# Patient Record
Sex: Male | Born: 1995 | Race: Black or African American | Hispanic: No | Marital: Single | Smoking: Current every day smoker
Health system: Southern US, Community
[De-identification: ages and names within clinical notes are randomized; demographics above are authoritative.]

## PROBLEM LIST (undated history)

## (undated) ENCOUNTER — Ambulatory Visit (HOSPITAL_COMMUNITY): Admission: EM | Payer: Medicaid Other | Source: Home / Self Care

## (undated) DIAGNOSIS — F32A Depression, unspecified: Secondary | ICD-10-CM

## (undated) DIAGNOSIS — F329 Major depressive disorder, single episode, unspecified: Secondary | ICD-10-CM

## (undated) DIAGNOSIS — D573 Sickle-cell trait: Secondary | ICD-10-CM

---
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
## 2016-09-18 ENCOUNTER — Ambulatory Visit (INDEPENDENT_AMBULATORY_CARE_PROVIDER_SITE_OTHER): Payer: BLUE CROSS/BLUE SHIELD | Admitting: Student

## 2016-09-18 ENCOUNTER — Encounter: Payer: Self-pay | Admitting: Student

## 2016-09-18 VITALS — BP 102/62 | HR 74 | Temp 98.4°F | Ht 64.2 in | Wt 108.0 lb

## 2016-09-18 DIAGNOSIS — F1290 Cannabis use, unspecified, uncomplicated: Secondary | ICD-10-CM | POA: Diagnosis not present

## 2016-09-18 DIAGNOSIS — Z0000 Encounter for general adult medical examination without abnormal findings: Secondary | ICD-10-CM | POA: Diagnosis not present

## 2016-09-18 DIAGNOSIS — F17200 Nicotine dependence, unspecified, uncomplicated: Secondary | ICD-10-CM | POA: Insufficient documentation

## 2016-09-18 DIAGNOSIS — Z87898 Personal history of other specified conditions: Secondary | ICD-10-CM | POA: Diagnosis not present

## 2016-09-18 DIAGNOSIS — D573 Sickle-cell trait: Secondary | ICD-10-CM

## 2016-09-18 NOTE — Assessment & Plan Note (Addendum)
History, problem list, medications and allergies updated.  Discussed about the health and social consequences of smoking cigarettes and marijuana. Advised him to quit. He says he had successfully quit smoking and didn't have issues in the past. He doesn't think his is addicted. He says he is confident about quitting on his own. Gave him 1800-QUIT-NOW for help with stopping smoking in case he needs. We have also discussed about diet and daily exercise.  Will review his medical records, including his immunizations when we receive them. [place] back [place] Otherwise, follow up in a year or sooner if needed.

## 2016-09-18 NOTE — Progress Notes (Signed)
Subjective:     Dustin Tate is a 21 y.o. old male here to establish care and for annual exam. Moved back from [place] to  about a year ago. Hasn't had PCP in a year. 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 No significant family history either.   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  Goes to gym every day Enough vegetables and fruits: yes.  Smokes cigarette: yes. 2 cigarettes a day for two years. Willing to quit.  Drinks EtOH: no Drug use: smokes marijuana  Patient takes vitD & Ca: no. Ever been transfused or tattooed?: no.  Sexually active with male. One male partner in the last 12 months. Uses condom consistently. History of depression: no.  Patient dental home: yes.  Immunizations  Needs influenza vaccine: not applicable.  Needs HPV (Women until age 50): no.  Needs Tdap: yes.  Needs Pneumococcal: no.  Screening Need colon cancer screening: no. Need breast cancer ccreening: not applicable. Need cervical cancer Screening: not applicable. STOP BANG >/=3 for OSA: no. Need lung cancer screening:not applicable. Need HCV Screening: not applicable. Need STI Screening: yes.  HPI  PMH/Problem List: has Routine adult health maintenance; History of prematurity; Sickle cell trait (HCC); Tobacco use disorder; and Marijuana use on his problem list.   has a past medical history of Prematurity at birth and Sickle cell trait (HCC).  Dustin Tate  No family history on file. Family history of heart disease before age of 1 yrs: no. Family history of stroke: no. Family history of cancer: no.  SH Social History  Substance Use Topics  . Smoking status: Current Some Day Smoker    Packs/day: 0.10    Years: 2.00    Types: Cigarettes    Start date: 2016  . Smokeless tobacco: Never Used  .  Alcohol use No     Review of Systems  Constitutional: Negative for fatigue, fever and unexpected weight change.  HENT: Negative for trouble swallowing.   Eyes: Negative for visual disturbance.  Respiratory: Negative for cough and chest tightness.   Cardiovascular: Negative for chest pain and leg swelling.  Gastrointestinal: Negative for abdominal pain, blood in stool and diarrhea.  Endocrine: Negative for cold intolerance, heat intolerance and polyuria.  Genitourinary: Negative for discharge, dysuria, hematuria and scrotal swelling.  Musculoskeletal: Negative for arthralgias and myalgias.  Skin: Negative for rash.  Neurological: Negative for light-headedness.  Hematological: Does not bruise/bleed easily.  Psychiatric/Behavioral: Negative for dysphoric mood. The patient is not nervous/anxious.        Objective:   Physical Exam Vitals:   09/18/16 1428  BP: 102/62  Pulse: 74  Temp: 98.4 F (36.9 C)  TempSrc: Oral  SpO2: 98%  Weight: 108 lb (49 kg)  Height: 5' 4.2" (1.631 m)   Body mass index is 18.42 kg/m.  GEN: appears well, no apparent distress. Head: normocephalic and atraumatic  Eyes: conjunctiva without injection, sclera anicteric Ears: external ear and ear canal normal Nares: no rhinorrhea, congestion or erythema Oropharynx: mmm without erythema or exudation HEM: negative for cervical or periauricular lymphadenopathies CVS: RRR, nl s1 & s2, no murmurs, no edema,  2+ DP & PT bil RESP: no IWOB, good air movement bilaterally, CTAB GI: BS present & normal, soft, NTND, no guarding, no rebound, no mass GU: circumcised, normal male genitalia, no testicular mass or  swelling. No hernia MSK: no focal tenderness or notable swelling SKIN: no apparent skin lesion ENDO: negative thyromegally  NEURO: alert and oiented appropriately, no gross deficits  PSYCH: euthymic mood with congruent affect     Assessment & Plan:  Routine adult health maintenance History, problem list,  medications and allergies updated.  Discussed about the health and social consequences of smoking cigarettes and marijuana. Advised him to quit. He says he had successfully quit smoking and didn't have issues in the past. He doesn't think his is addicted. He says he is confident about quitting on his own. Gave him 1800-QUIT-NOW for help with stopping smoking in case he needs. We have also discussed about diet and daily exercise.  Will review his medical records, including his immunizations when we receive them. He recently moved back from [place] Otherwise, [place] in a year or sooner if needed.   Candelaria Stagersaye Dustin Tate PGY-3 Pager [phone] 09/18/16  7:21 PM

## 2016-09-18 NOTE — Patient Instructions (Signed)
It was great seeing you today! Your exam is normal.  Smoking: I strongly recommend quitting smoking. Call 1800-QUIT-NOW for help with stopping smoking.   If we did any lab work today, and the results require attention, either me or my nurse will get in touch with you. If everything is normal, you will get a letter in mail and a message via . If you don't hear from us in two weeks, please give us a call. Otherwise, we look forward to seeing you again at your next visit. If you have any questions or concerns before then, please call the clinic at [phone].  Please bring all your medications to every doctors visit  Sign up for My Chart to have easy access to your labs results, and communication with your Primary care physician.    Please check-out at the front desk before leaving the clinic.    Take Care,   Dr. Alanda SlimGonfa   Smoking Tobacco Information Smoking tobacco will very likely harm your health. Tobacco contains a poisonous (toxic), colorless chemical called nicotine. Nicotine affects the brain and makes tobacco addictive. This change in your brain can make it hard to stop smoking. Tobacco also has other toxic chemicals that can hurt your body and raise your risk of many cancers. How can smoking tobacco affect me? Smoking tobacco can increase your chances of having serious health conditions, such as:  Cancer. Smoking is most commonly associated with lung cancer, but can lead to cancer in other parts of the body.  Chronic obstructive pulmonary disease (COPD). This is a long-term lung condition that makes it hard to breathe. It also gets worse over time.  High blood pressure (hypertension), heart disease, stroke, or heart attack.  Lung infections, such as pneumonia.  Cataracts. This is when the lenses in the eyes become clouded.  Digestive problems. This may include peptic ulcers, heartburn, and gastroesophageal reflux disease (GERD).  Oral health problems, such as gum disease  and tooth loss.  Loss of taste and smell.  Smoking can affect your appearance by causing:  Wrinkles.  Yellow or stained teeth, fingers, and fingernails.  Smoking tobacco can also affect your social life.  Many workplaces, [employer], hotels, and public places are tobacco-free. This means that you may experience challenges in finding places to smoke when away from home.  The cost of a smoking habit can be expensive. Expenses for someone who smokes come in two ways: ? You spend money on a regular basis to buy tobacco. ? Your health care costs in the long-term are higher if you smoke.  Tobacco smoke can also affect the health of those around you. Children of smokers have greater chances of: ? Sudden infant death syndrome (SIDS). ? Ear infections. ? Lung infections.  What lifestyle changes can be made?  Do not start smoking. Quit if you already do.  To quit smoking: ? Make a plan to quit smoking and commit yourself to it. Look for programs to help you and ask your health care provider for recommendations and ideas. ? Talk with your health care provider about using nicotine replacement medicines to help you quit. Medicine replacement medicines include gum, lozenges, patches, sprays, or pills. ? Do not replace cigarette smoking with electronic cigarettes, which are commonly called e-cigarettes. The safety of e-cigarettes is not known, and some may contain harmful chemicals. ? Avoid places, people, or situations that tempt you to smoke. ? If you try to quit but return to smoking, don't give up hope. It  is very common for people to try a number of times before they fully succeed. When you feel ready again, give it another try.  Quitting smoking might affect the way you eat as well as your weight. Be prepared to monitor your eating habits. Get support in planning and following a healthy diet.  Ask your health care provider about having regular tests (screenings) to check for cancer. This  may include blood tests, imaging tests, and other tests.  Exercise regularly. Consider taking walks, joining a gym, or doing yoga or exercise classes.  Develop skills to manage your stress. These skills include meditation. What are the benefits of quitting smoking? By quitting smoking, you may:  Lower your risk of getting cancer and other diseases caused by smoking.  Live longer.  Breathe better.  Lower your blood pressure and heart rate.  Stop your addiction to tobacco.  Stop creating secondhand smoke that hurts other people.  Improve your sense of taste and smell.  Look better over time, due to having fewer wrinkles and less staining.  What can happen if changes are not made? If you do not stop smoking, you may:  Get cancer and other diseases.  Develop COPD or other long-term (chronic) lung conditions.  Develop serious problems with your heart and blood vessels (cardiovascular system).  Need more tests to screen for problems caused by smoking.  Have higher, long-term healthcare costs from medicines or treatments related to smoking.  Continue to have worsening changes in your lungs, mouth, and nose.  Where to find support: To get support to quit smoking, consider:  Asking your health care provider for more information and resources.  Taking classes to learn more about quitting smoking.  Looking for local organizations that offer resources about quitting smoking.  Joining a support group for people who want to quit smoking in your local community.  Where to find more information: You may find more information about quitting smoking from:  HelpGuide.org: www.helpguide.org/articles/addictions/how-to-quit-smoking.htm  [employer]: smokefree.gov  American Lung Association: www.lung.org  Contact a health care provider if:  You have problems breathing.  Your lips, nose, or fingers turn blue.  You have chest pain.  You are coughing up blood.  You feel  faint or you pass out.  You have other noticeable changes that cause you to worry. Summary  Smoking tobacco can negatively affect your health, the health of those around you, your finances, and your social life.  Do not start smoking. Quit if you already do. If you need help quitting, ask your health care provider.  Think about joining a support group for people who want to quit smoking in your local community. There are many effective programs that will help you to quit this behavior. This information is not intended to replace advice given to you by your health care provider. Make sure you discuss any questions you have with your health care provider. Document Released: 02/05/2016 Document Revised: 02/05/2016 Document Reviewed: 02/05/2016 Elsevier Interactive Patient Education  [employer].

## 2017-05-13 ENCOUNTER — Ambulatory Visit (HOSPITAL_COMMUNITY)
Admission: EM | Admit: 2017-05-13 | Discharge: 2017-05-13 | Disposition: A | Discharge status: Home / Self Care | Payer: BLUE CROSS/BLUE SHIELD | Attending: Family Medicine | Admitting: Family Medicine

## 2017-05-13 ENCOUNTER — Other Ambulatory Visit: Payer: Self-pay

## 2017-05-13 ENCOUNTER — Encounter (HOSPITAL_COMMUNITY): Payer: Self-pay | Admitting: Emergency Medicine

## 2017-05-13 DIAGNOSIS — M7701 Medial epicondylitis, right elbow: Secondary | ICD-10-CM

## 2017-05-13 MED ORDER — IBUPROFEN 800 MG PO TABS: 800.0000 mg | ORAL_TABLET | Freq: Three times a day (TID) | ORAL | 0 refills | Status: AC | PRN

## 2017-05-13 NOTE — ED Provider Notes (Signed)
[facility]    CSN: [id number] Arrival date & time: 05/13/17  1342     History   Chief Complaint Chief Complaint  Patient presents with  . Elbow Pain    right    HPI Dustin Tate is a 22 y.o. male presenting today with right elbow pain.  States that his pain began yesterday.  He denies any injury or initiating incident.  Denies numbness or tingling.  Denies weakness in his fingers.  Denies any shoulder pain.  Patient denies working or doing activities that involve his arms a lot.  He does note that he has been gaming with a controller a lot.  Denies any recreational sports or activities.  HPI  Past Medical History:  Diagnosis Date  . 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 Sickle cell trait [facility](HCC)     Patient Active Problem List   Diagnosis Date Noted  . Routine adult health maintenance 09/18/2016  . History of prematurity 09/18/2016  . Sickle cell trait (HCC) 09/18/2016  . Tobacco use disorder 09/18/2016  . Marijuana use 09/18/2016    History reviewed. No pertinent surgical history.     Home Medications    Prior to Admission medications   Medication Sig Start Date End Date Taking? Authorizing Provider  ibuprofen (ADVIL,MOTRIN) 800 MG tablet Take 1 tablet (800 mg total) by mouth every 8 (eight) hours as needed for up to 10 days. 05/13/17 05/23/17  Shariece Viveiros, Junius CreamerHallie C, PA-C    Family History History reviewed. No pertinent family history.  Social History Social History   Tobacco Use  . Smoking status: Current Some Day Smoker    Packs/day: 0.10    Years: 2.00    Pack years: 0.20    Types: Cigarettes    Start date: 2016  . Smokeless tobacco: Never Used  Substance Use Topics  . Alcohol use: No  . Drug use: Yes    Frequency: 2.0 times per week    Types: Marijuana    Comment: Advised to quit     Allergies   Patient has no known allergies.   Review of Systems Review of Systems  Respiratory: Negative for shortness of breath.   Cardiovascular:  Negative for chest pain.  Gastrointestinal: Negative for abdominal pain, nausea and vomiting.  Musculoskeletal: Positive for arthralgias and myalgias. Negative for back pain, joint swelling, neck pain and neck stiffness.  Skin: Negative for color change, pallor and wound.  Neurological: Negative for weakness and numbness.     Physical Exam Triage Vital Signs ED Triage Vitals  Enc Vitals Group     BP 05/13/17 1358 113/73     Pulse Rate 05/13/17 1358 86     Resp --      Temp 05/13/17 1358 98.5 F (36.9 C)     Temp Source 05/13/17 1358 Oral     SpO2 05/13/17 1358 99 %     Weight --      Height --      Head Circumference --      Peak Flow --      Pain Score 05/13/17 1359 7     Pain Loc --      Pain Edu? --      Excl. in GC? --    No data found.  Updated Vital Signs BP 113/73 (BP Location: Left Arm)   Pulse 86   Temp 98.5 F (36.9 C) (Oral)   SpO2 99%   Visual Acuity Right Eye Distance:   Left Eye Distance:  Bilateral Distance:    Right Eye Near:   Left Eye Near:    Bilateral Near:     Physical Exam  Constitutional: He appears well-developed and well-nourished.  HENT:  Head: Normocephalic and atraumatic.  Eyes: Conjunctivae are normal.  Neck: Neck supple.  Cardiovascular: Normal rate.  Pulmonary/Chest: Effort normal. No respiratory distress.  Genitourinary:  Genitourinary Comments: Right upper extremity: No obvious deformity or swelling to shoulder, elbow or wrist.  Full active range of motion at shoulder and wrist.  Radial pulse 2+, distal sensation intact.  Patient holding right elbow with slight flexion approximately 120 degrees, resisting full extension or full flexion.  Mild tenderness to palpation of medial epicondyle  Musculoskeletal: He exhibits no edema.  Neurological: He is alert.  Skin: Skin is warm and dry.  Psychiatric: He has a normal mood and affect.  Nursing note and vitals reviewed.    UC Treatments / Results  Labs (all labs ordered are  listed, but only abnormal results are displayed) Labs Reviewed - No data to display  EKG None Radiology No results found.  Procedures Procedures (including critical care time)  Medications Ordered in UC Medications - No data to display   Initial Impression / Assessment and Plan / UC Course  I have reviewed the triage vital signs and the nursing notes.  Pertinent labs & imaging results that were available during my care of the patient were reviewed by me and considered in my medical decision making (see chart for details).     Patient with elbow pain without injury, unlikely fracture or dislocation.  Will defer imaging for now.  Will treat as medial epicondylitis from overuse related to gaming.  Recommended taking anti-inflammatories consistently over the next 2 weeks, icing daily as well as doing range of motion exercises.  Also discussed getting a brace to apply just distal to elbow to relieve tension off the tendons. Discussed strict return precautions. Patient verbalized understanding and is agreeable with plan.   Final Clinical Impressions(s) / UC Diagnoses   Final diagnoses:  Medial epicondylitis of right elbow    ED Discharge Orders        Ordered    ibuprofen (ADVIL,MOTRIN) 800 MG tablet  Every 8 hours PRN     05/13/17 1412       Controlled Substance Prescriptions [place] Controlled Substance Registry consulted? Not Applicable   Lew Dawes, [place] 05/13/17 1418

## 2017-05-13 NOTE — Discharge Instructions (Signed)
Use anti-inflammatories for pain/swelling. You may take up to 800 mg Ibuprofen every 8 hours with food. You may supplement Ibuprofen with Tylenol [id number] mg every 8 hours.   Ice elbow multiple times a day  Perform exercises provided 5-10 times each

## 2017-05-13 NOTE — ED Triage Notes (Signed)
Pt reports right elbow pain not related to an injury that started yesterday.

## 2017-08-20 ENCOUNTER — Encounter: Payer: Self-pay | Admitting: Student in an Organized Health Care Education/Training Program

## 2017-08-20 ENCOUNTER — Other Ambulatory Visit: Payer: Self-pay

## 2017-08-20 ENCOUNTER — Ambulatory Visit (INDEPENDENT_AMBULATORY_CARE_PROVIDER_SITE_OTHER): Payer: BLUE CROSS/BLUE SHIELD | Admitting: Student in an Organized Health Care Education/Training Program

## 2017-08-20 VITALS — BP 112/64 | HR 77 | Temp 98.3°F | Ht 65.0 in | Wt 107.6 lb

## 2017-08-20 DIAGNOSIS — F17200 Nicotine dependence, unspecified, uncomplicated: Secondary | ICD-10-CM

## 2017-08-20 DIAGNOSIS — Z0000 Encounter for general adult medical examination without abnormal findings: Secondary | ICD-10-CM

## 2017-08-20 DIAGNOSIS — Z23 Encounter for immunization: Secondary | ICD-10-CM | POA: Diagnosis not present

## 2017-08-20 NOTE — Patient Instructions (Addendum)
It was a pleasure seeing you today in our clinic. Here is the treatment plan we have discussed and agreed upon together:  You received the TDAP tetanus vaccine today.  Your weight today is 107.6, and it was previously 108. Your height is 5 feet 5 inches.  For tobacco cessation, schedule a visit with Dr. Raymondo BandKoval in our office.  Our clinic's number is [phone]. Please call with questions or concerns about what we discussed today.  Be well, Dr. Mosetta PuttFeng

## 2017-08-20 NOTE — Progress Notes (Signed)
   CC: health maintenance check  HPI: Dustin Tate is a 22 y.o. male with PMH significant for sickle cell trait and learning disability who [facility].  His mother provides additional history.  Patient looks down at his phone and does not offer any information. He answers questions pleasantly and appropriately.  Patient does not have any concerns or complaints. States he is feeling well. He reports that he works at [employer], feels safe at home and in all of his relationships. He denies alcohol or drug use. He smokes 2-4 cigarettes daily but he is interested in resources for quitting.   Overdue health maintenance HIV screen (patient declines) and TDAP (agreeable, will do today)  Review of Symptoms:  See HPI for ROS.   CC, SH/smoking status, and VS noted.  Objective: BP 112/64   Pulse 77   Temp 98.3 F (36.8 C) (Oral)   Ht 5\' 5"  (1.651 m)   Wt 107 lb 9.6 oz (48.8 kg)   SpO2 99%   BMI 17.91 kg/m  GEN: NAD, alert, cooperative, and pleasant EYE: no conjunctival injection, pupils equally round and reactive to light ENMT: no rhinorrhea, no pharyngeal erythema or exudates NECK: full ROM, no thyromegaly RESPIRATORY: clear to auscultation bilaterally with no wheezes, rhonchi or rales, good effort CV: RRR, no m/r/g, no peripheral edema GI: soft, non-tender, non-distended, no hepatosplenomegaly SKIN: warm and dry, no rashes or lesions NEURO: II-XII grossly intact, normal gait, peripheral sensation intact PSYCH: AAOx3, appropriate affect, learning disability  Assessment and plan:  Tobacco use disorder Patient is interested in tobacco cessation. His mother is surprised that he is interested. Offered resources, and patient is agreeable to setting up an appointment with Dr. Raymondo BandKoval for further cessation counseling.  Routine adult health maintenance Patient appears well. No current concerns or complaints. Defer HIV screen at  patient's request. TDAP given in today's visit.   Orders Placed This Encounter  Procedures  . Tdap vaccine greater than or equal to 7yo IM    No orders of the defined types were placed in this encounter.    Howard PouchLauren Charnise Lovan, MD,MS,  PGY2 08/20/2017 10:19 AM

## 2017-08-20 NOTE — Assessment & Plan Note (Signed)
Patient appears well. No current concerns or complaints. Defer HIV screen at patient's request. TDAP given in today's visit.

## 2017-08-20 NOTE — Assessment & Plan Note (Signed)
Patient is interested in tobacco cessation. His mother is surprised that he is interested. Offered resources, and patient is agreeable to setting up an appointment with Dr. Raymondo BandKoval for further cessation counseling.

## 2017-09-24 ENCOUNTER — Ambulatory Visit (HOSPITAL_COMMUNITY)
Admission: EM | Admit: 2017-09-24 | Discharge: 2017-09-24 | Disposition: A | Discharge status: Home / Self Care | Payer: BLUE CROSS/BLUE SHIELD | Attending: Family Medicine | Admitting: Family Medicine

## 2017-09-24 ENCOUNTER — Other Ambulatory Visit: Payer: Self-pay

## 2017-09-24 ENCOUNTER — Encounter (HOSPITAL_COMMUNITY): Payer: Self-pay

## 2017-09-24 DIAGNOSIS — R112 Nausea with vomiting, unspecified: Secondary | ICD-10-CM

## 2017-09-24 DIAGNOSIS — R1031 Right lower quadrant pain: Secondary | ICD-10-CM

## 2017-09-24 LAB — POCT URINALYSIS DIP (DEVICE)
Bilirubin Urine: NEGATIVE
Glucose, UA: NEGATIVE mg/dL
Hgb urine dipstick: NEGATIVE
KETONES UR: NEGATIVE mg/dL
Leukocytes, UA: NEGATIVE
Nitrite: NEGATIVE
PH: 6 (ref 5.0–8.0)
Protein, ur: NEGATIVE mg/dL
Specific Gravity, Urine: 1.025 (ref 1.005–1.030)
Urobilinogen, UA: 0.2 mg/dL (ref 0.0–1.0)

## 2017-09-24 MED ORDER — ONDANSETRON 4 MG PO TBDP
4.0000 mg | ORAL_TABLET | Freq: Once | ORAL | Status: AC
  Administered 2017-09-24: 4 mg via ORAL

## 2017-09-24 MED ORDER — ONDANSETRON HCL 4 MG PO TABS: 4.0000 mg | ORAL_TABLET | Freq: Four times a day (QID) | ORAL | 0 refills | Status: DC

## 2017-09-24 MED ORDER — ONDANSETRON 4 MG PO TBDP
ORAL_TABLET | ORAL | Status: AC
  Filled 2017-09-24: qty 1

## 2017-09-24 NOTE — ED Triage Notes (Signed)
Pt states he was at work today and he started to get the abdominal pain and vomiting.

## 2017-09-24 NOTE — Discharge Instructions (Addendum)
Urine did not show signs of infection Zofran given in office Get rest and drink fluids Zofran prescribed.  Take as directed.   DIET Instructions: 30 minutes after taking nausea medicine, begin with sips of clear liquids. If able to hold down 2 - 4 ounces for 30 minutes, begin drinking more. Increase your fluid intake to replace losses. Clear liquids only for 24 hours (water, tea, sport drinks, clear flat ginger ale or cola and juices, broth, jello, popsicles, ect). Advance to bland foods, applesauce, rice, baked or boiled chicken, ect. Avoid milk, greasy foods and anything that doesnt agree with you.  If you experience new or worsening symptoms go to ER If vomiting persists and you are unable to hold fluids, or if you develop high fever or abdominal pain go to the ER.

## 2017-09-24 NOTE — ED Provider Notes (Signed)
[facility]   [id number] 09/24/17 Arrival Time: 1131  CC: ABDOMINAL DISCOMFORT  SUBJECTIVE:  Dustin Tate is a 22 y.o. male who presents with complaint of abdominal discomfort that began abruptly today while at work.  Reports associated nonbloody or bilious vomiting x 6 episodes today.  Denies a precipitating event, eating anything out of the ordinary, recent travel, or close contacts with similar symptoms.  Diffuse abdominal discomfort.   States symptoms have improved.  Has not tried OTC medications.  Denies alleviating or aggravating factors.  Reports similar symptoms in the past improved with rest.  Last BM today and normal.  Complains of decreased appetite  Denies fever, chills, weight changes, nausea, chest pain, SOB, diarrhea, constipation, hematochezia, melena, dysuria, difficulty urinating, increased frequency or urgency, flank pain, loss of bowel or bladder function.  No LMP for male patient.  ROS: As per HPI.  Past Medical History:  Diagnosis Date  . 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 Sickle cell trait (HCC)    History reviewed. No pertinent surgical history. No Known Allergies No current facility-administered medications on file prior to encounter.    No current outpatient medications on file prior to encounter.   Social History   Socioeconomic History  . Marital status: Single    Spouse name: Not on file  . Number of children: Not on file  . Years of education: Not on file  . Highest education level: Not on file  Occupational History  . Occupation: Health and safety inspector: [employer]    Comment: Studies Primary school teacher  Social Needs  . Financial resource strain: Not on file  . Food insecurity:    Worry: Not on file    Inability: Not on file  . Transportation needs:    Medical: Not on file    Non-medical: Not on file  Tobacco Use  . Smoking status: Current Some Day Smoker    Packs/day: 0.10    Years: 2.00    Pack years: 0.20    Types: Cigarettes    Start  date: 2016  . Smokeless tobacco: Never Used  Substance and Sexual Activity  . Alcohol use: No  . Drug use: Yes    Frequency: 2.0 times per week    Types: Marijuana    Comment: Advised to quit  . Sexual activity: Yes    Partners: Female    Birth control/protection: Condom  Lifestyle  . Physical activity:    Days per week: Not on file    Minutes per session: Not on file  . Stress: Not on file  Relationships  . Social connections:    Talks on phone: Not on file    Gets together: Not on file    Attends religious service: Not on file    Active member of club or organization: Not on file    Attends meetings of clubs or organizations: Not on file    Relationship status: Not on file  . Intimate partner violence:    Fear of current or ex partner: Not on file    Emotionally abused: Not on file    Physically abused: Not on file    Forced sexual activity: Not on file  Other Topics Concern  . 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 Healthy Mother   . Healthy Father      OBJECTIVE:  Vitals:   09/24/17 1145  09/24/17 1147  BP: 133/70   Pulse: 68   Resp: 16   Temp: 97.8 F (36.6 C)   TempSrc: Oral   SpO2: 100%   Weight:  108 lb (49 kg)    General appearance: AOx3 in no acute distress; nontoxic appearance HEENT: NCAT.  Oropharynx clear.  Lungs: clear to auscultation bilaterally without adventitious breath sounds Heart: regular rate and rhythm.  Radial pulses 2+ symmetrical bilaterally Abdomen: soft, non-distended; normal active bowel sounds; non-tender to light and deep palpation; mildly tender at McBurney's point; negative Murphy's sign; negative rebound; no guarding Extremities: no edema; symmetrical with no gross deformities Skin: warm and dry Neurologic: normal gait Psychological: alert and cooperative; normal mood and affect  Labs: Results for orders placed or performed  during the hospital encounter of 09/24/17 (from the past 24 hour(s))  POCT urinalysis dip (device)     Status: None   Collection Time: 09/24/17 12:48 PM  Result Value Ref Range   Glucose, UA NEGATIVE NEGATIVE mg/dL   Bilirubin Urine NEGATIVE NEGATIVE   Ketones, ur NEGATIVE NEGATIVE mg/dL   Specific Gravity, Urine 1.025 1.005 - 1.030   Hgb urine dipstick NEGATIVE NEGATIVE   pH 6.0 5.0 - 8.0   Protein, ur NEGATIVE NEGATIVE mg/dL   Urobilinogen, UA 0.2 0.0 - 1.0 mg/dL   Nitrite NEGATIVE NEGATIVE   Leukocytes, UA NEGATIVE NEGATIVE    MDM:   Patient presenting with abdominal discomfort and vomiting that began today.  VS stable.  NAD and nontoxic appearance.  PE mostly unremarkable, but endorses RLQ tenderness.  Discussed possibility of appendicitis with location of symptoms.  Patient declines further evaluation and management in the ED today.  Would like to try outpatient treatment with diet modifications.  Strict ED precautions given  ASSESSMENT & PLAN:  1. Non-intractable vomiting with nausea, unspecified vomiting type   2. Abdominal discomfort in right lower quadrant     Meds ordered this encounter  Medications  . ondansetron (ZOFRAN-ODT) disintegrating tablet 4 mg  . ondansetron (ZOFRAN) 4 MG tablet    Sig: Take 1 tablet (4 mg total) by mouth every 6 (six) hours.    Dispense:  12 tablet    Refill:  0    Order Specific Question:   Supervising Provider    Answer:   Isa RankinMURRAY, LAURA WILSON [[id number]]   Urine did not show signs of infection Zofran given in office Get rest and drink fluids Zofran prescribed.  Take as directed.   DIET Instructions: 30 minutes after taking nausea medicine, begin with sips of clear liquids. If able to hold down 2 - 4 ounces for 30 minutes, begin drinking more. Increase your fluid intake to replace losses. Clear liquids only for 24 hours (water, tea, sport drinks, clear flat ginger ale or cola and juices, broth, jello, popsicles, ect). Advance to bland  foods, applesauce, rice, baked or boiled chicken, ect. Avoid milk, greasy foods and anything that doesn't agree with you.  If you experience new or worsening symptoms go to ER If vomiting persists and you are unable to hold fluids, or if you develop high fever or abdominal pain go to the ER.   Reviewed expectations re: course of current medical issues. Questions answered. Outlined signs and symptoms indicating need for more acute intervention. Patient verbalized understanding. After Visit Summary given.   Rennis HardingWurst, Kirstie Larsen, PA-C 09/24/17 1405

## 2018-04-01 ENCOUNTER — Encounter (HOSPITAL_COMMUNITY): Payer: Self-pay | Admitting: Emergency Medicine

## 2018-04-01 ENCOUNTER — Ambulatory Visit (HOSPITAL_COMMUNITY)
Admission: EM | Admit: 2018-04-01 | Discharge: 2018-04-01 | Disposition: A | Discharge status: Home / Self Care | Payer: BLUE CROSS/BLUE SHIELD | Attending: Family Medicine | Admitting: Family Medicine

## 2018-04-01 DIAGNOSIS — R591 Generalized enlarged lymph nodes: Secondary | ICD-10-CM

## 2018-04-01 DIAGNOSIS — R599 Enlarged lymph nodes, unspecified: Secondary | ICD-10-CM

## 2018-04-01 LAB — CBC WITH DIFFERENTIAL/PLATELET
ABS IMMATURE GRANULOCYTES: 0.02 10*3/uL (ref 0.00–0.07)
Basophils Absolute: 0 10*3/uL (ref 0.0–0.1)
Basophils Relative: 1 %
Eosinophils Absolute: 0 10*3/uL (ref 0.0–0.5)
Eosinophils Relative: 0 %
HCT: 38.4 % — ABNORMAL LOW (ref 39.0–52.0)
Hemoglobin: 13 g/dL (ref 13.0–17.0)
Immature Granulocytes: 1 %
LYMPHS PCT: 30 %
Lymphs Abs: 0.9 10*3/uL (ref 0.7–4.0)
MCH: 25.2 pg — AB (ref 26.0–34.0)
MCHC: 33.9 g/dL (ref 30.0–36.0)
MCV: 74.4 fL — ABNORMAL LOW (ref 80.0–100.0)
Monocytes Absolute: 0.4 10*3/uL (ref 0.1–1.0)
Monocytes Relative: 14 %
Neutro Abs: 1.7 10*3/uL (ref 1.7–7.7)
Neutrophils Relative %: 54 %
Platelets: 300 10*3/uL (ref 150–400)
RBC: 5.16 MIL/uL (ref 4.22–5.81)
RDW: 13.7 % (ref 11.5–15.5)
WBC: 3 10*3/uL — AB (ref 4.0–10.5)
nRBC: 0 % (ref 0.0–0.2)

## 2018-04-01 LAB — POCT INFECTIOUS MONO SCREEN: MONO SCREEN: NEGATIVE

## 2018-04-01 MED ORDER — SULFAMETHOXAZOLE-TRIMETHOPRIM 800-160 MG PO TABS: 1.0000 | ORAL_TABLET | Freq: Two times a day (BID) | ORAL | 0 refills | Status: DC

## 2018-04-01 NOTE — ED Triage Notes (Signed)
PT C/O: mult abscess on bilateral axillas onset 1 week ... reports they are painful to the touch  Also c/o cold sx.   DENIES: fevers, drainage  TAKING MEDS: Acetamionphen  A&O x4... NAD... Ambulatory

## 2018-04-01 NOTE — Discharge Instructions (Addendum)
Please take tylenol for fever Please stay well hydrated.  Please follow up in 2 days if the symptoms are still occurring.

## 2018-04-01 NOTE — ED Provider Notes (Signed)
[facility]    CSN: [id number] Arrival date & time: 04/01/18  1534     History   Chief Complaint Chief Complaint  Patient presents with  . Abscess    HPI Dustin Tate is a 23 y.o. male.   Presenting with bilateral axillary pain.  He has noticed different knots over the past week.  He has tenderness and redness in this area.  Denies any rashes.  Has not had any recent illnesses.  Denies being sexually active.  Feels like the symptoms are getting worse.  Has not taken anything for it.  He is not having any symptoms anywhere else.  Denies any recent travel.  He works for [employer].  HPI  Past Medical History:  Diagnosis Date  . 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 Sickle cell trait Dustin Tate)     Patient Active Problem List   Diagnosis Date Noted  . Routine adult health maintenance 09/18/2016  . History of prematurity 09/18/2016  . Sickle cell trait (HCC) 09/18/2016  . Tobacco use disorder 09/18/2016  . Marijuana use 09/18/2016    History reviewed. No pertinent surgical history.     Home Medications    Prior to Admission medications   Medication Sig Start Date End Date Taking? Authorizing Provider  ondansetron (ZOFRAN) 4 MG tablet Take 1 tablet (4 mg total) by mouth every 6 (six) hours. 09/24/17   Wurst, [place], PA-C  sulfamethoxazole-trimethoprim (BACTRIM DS,SEPTRA DS) 800-160 MG tablet Take 1 tablet by mouth 2 (two) times daily. 04/01/18   Myra Rude, MD    Family History Family History  Problem Relation Age of Onset  . Healthy Mother   . Healthy Father     Social History Social History   Tobacco Use  . Smoking status: Current Some Day Smoker    Packs/day: 0.10    Years: 2.00    Pack years: 0.20    Types: Cigarettes    Start date: 2016  . Smokeless tobacco: Never Used  Substance Use Topics  . Alcohol use: No  . Drug use: Yes    Frequency: 2.0 times per week    Types: Marijuana    Comment: Advised to quit     Allergies   Patient has no known  allergies.   Review of Systems Review of Systems  Constitutional: Positive for fever.  HENT: Negative for congestion.   Respiratory: Negative for cough.   Cardiovascular: Negative for chest pain.  Gastrointestinal: Negative for abdominal pain.  Musculoskeletal: Negative for gait problem.  Skin: Positive for rash.  Neurological: Negative for weakness.  Hematological: Positive for adenopathy.  Psychiatric/Behavioral: Negative for agitation.     Physical Exam Triage Vital Signs ED Triage Vitals  Enc Vitals Group     BP 04/01/18 1630 (!) 105/58     Pulse Rate 04/01/18 1630 87     Resp 04/01/18 1630 20     Temp 04/01/18 1630 100.1 F (37.8 C)     Temp Source 04/01/18 1630 Temporal     SpO2 04/01/18 1630 100 %     Weight --      Height --      Head Circumference --      Peak Flow --      Pain Score 04/01/18 1628 3     Pain Loc --      Pain Edu? --      Excl. in GC? --    No data found.  Updated Vital Signs BP (!) 105/58 (BP  Location: Right Arm)   Pulse 87   Temp 100.1 F (37.8 C) (Temporal)   Resp 20   SpO2 100%   Visual Acuity Right Eye Distance:   Left Eye Distance:   Bilateral Distance:    Right Eye Near:   Left Eye Near:    Bilateral Near:     Physical Exam Gen: NAD, alert, cooperative with exam,  ENT: normal lips, normal nasal mucosa, no tonsillar exudates Eye: normal EOM, normal conjunctiva and lids CV:  no edema, +2 pedal pulses   Resp: no accessory muscle use, non-labored, clear to auscultation bilaterally Skin: no rashes, Neuro: normal tone, normal sensation to touch Psych:  normal insight, alert and oriented MSK: Lymphadenopathy with redness and tenderness in the left and right axilla, no streaking, no discharge or fluctuance, there does appear to be areas of induration.         UC Treatments / Results  Labs (all labs ordered are listed, but only abnormal results are displayed) Labs Reviewed  CBC WITH DIFFERENTIAL/PLATELET - Abnormal;  Notable for the following components:      Result Value   WBC 3.0 (*)    HCT 38.4 (*)    MCV 74.4 (*)    MCH 25.2 (*)    All other components within normal limits  POCT INFECTIOUS MONO SCREEN  POCT INFECTIOUS MONO SCREEN    EKG None  Radiology No results found.  Procedures Procedures (including critical care time)  Medications Ordered in UC Medications - No data to display  Initial Impression / Assessment and Plan / UC Course  I have reviewed the triage vital signs and the nursing notes.  Pertinent labs & imaging results that were available during my care of the patient were reviewed by me and considered in my medical decision making (see chart for details).     Dustin Tate is a 23 year old male that is presenting with bilateral lymphadenopathy.  Having some redness and tenderness in these areas.  Monospot was negative.  Will obtain a CBC.  Unclear if this is related to infection versus reactive lymph nodes.  Will provide with Bactrim.  Given indications to return to follow-up.  Provided with a work note  Final Clinical Impressions(s) / UC Diagnoses   Final diagnoses:  Lymphadenopathy     Discharge Instructions     Please take tylenol for fever Please stay well hydrated.  Please follow up in 2 days if the symptoms are still occurring.     ED Prescriptions    Medication Sig Dispense Auth. Provider   sulfamethoxazole-trimethoprim (BACTRIM DS,SEPTRA DS) 800-160 MG tablet Take 1 tablet by mouth 2 (two) times daily. 14 tablet Myra Rude, MD     Controlled Substance Prescriptions [place] Controlled Substance Registry consulted? Not Applicable   Myra Rude, MD 04/01/18 1816

## 2018-04-05 ENCOUNTER — Telehealth (HOSPITAL_COMMUNITY): Payer: Self-pay | Admitting: Emergency Medicine

## 2018-04-05 NOTE — Telephone Encounter (Signed)
Attempted to reach patient. No answer at this time. Voicemail left.    

## 2018-09-29 ENCOUNTER — Encounter: Payer: Self-pay | Admitting: Family Medicine

## 2018-09-29 ENCOUNTER — Ambulatory Visit (INDEPENDENT_AMBULATORY_CARE_PROVIDER_SITE_OTHER): Payer: Medicaid Other | Admitting: Family Medicine

## 2018-09-29 ENCOUNTER — Other Ambulatory Visit: Payer: Self-pay

## 2018-09-29 DIAGNOSIS — F1290 Cannabis use, unspecified, uncomplicated: Secondary | ICD-10-CM

## 2018-09-29 DIAGNOSIS — Z0000 Encounter for general adult medical examination without abnormal findings: Secondary | ICD-10-CM

## 2018-09-29 DIAGNOSIS — F17200 Nicotine dependence, unspecified, uncomplicated: Secondary | ICD-10-CM

## 2018-09-29 NOTE — Assessment & Plan Note (Signed)
Patient counseled on the effects of marijuana, including lung damage and blunting of motivation over time.  Patient is in the pre-contemplative stage.

## 2018-09-29 NOTE — Assessment & Plan Note (Signed)
Patient was counseled on HIV testing today but declined.  Also counseled on getting his flu shot this fall.

## 2018-09-29 NOTE — Progress Notes (Signed)
   Subjective:    Dustin Tate - 23 y.o. male MRN [id number]  Date of birth: February 21, 1995  CC:  Dustin Tate is here for his annual physical.  He has no concerns today.  HPI:  Patient reports that he is doing well overall.  He spends his time playing video games at home with his brother.  He gets along well with his mother and brother.  He is looking to find a job with [employer] soon.  He says that he eats a varied diet and will walk for exercise.  He denies alcohol use but does endorse tobacco use, about 2 cigarettes/day.  He also endorses marijuana use every weekend.  He denies any current sexual activity and is not interested in HIV testing.  Health Maintenance:  Health Maintenance Due  Topic Date Due  . INFLUENZA VACCINE  09/04/2018    -  reports that he has been smoking cigarettes. He started smoking about 4 years ago. He has a 0.20 pack-year smoking history. He has never used smokeless tobacco. Review of Systems  Constitutional: Negative for fever.  HENT: Negative for congestion.   Respiratory: Negative for cough.   Cardiovascular: Negative for chest pain.  Gastrointestinal: Negative for abdominal pain.  Skin: Negative for rash.  Neurological: Negative for headaches.  Psychiatric/Behavioral: Negative for depression.    - Past Medical History: Patient Active Problem List   Diagnosis Date Noted  . Routine adult health maintenance 09/18/2016  . History of prematurity 09/18/2016  . Sickle cell trait ([place]) 09/18/2016  . Tobacco use disorder 09/18/2016  . Marijuana use 09/18/2016   - Medications: reviewed and updated   Objective:   Physical Exam BP 110/65   Pulse 94   Ht 5' 3.54" (1.614 m)   Wt 107 lb (48.5 kg)   SpO2 99%   BMI 18.63 kg/m  Gen: NAD, alert, cooperative with exam, well-appearing, thin HEENT: NCAT, PERRL, clear conjunctiva, oropharynx clear, supple neck, tympanic membranes clear bilaterally CV: RRR, good S1/S2, no murmur, no edema Resp: CTABL,  no wheezes, non-labored Abd: SNTND, BS present, no guarding or organomegaly Skin: no rashes, normal turgor  Neuro: no gross deficits.  Psych: Flat affect, responds appropriately to questioning        Assessment & Plan:   Routine adult health maintenance Patient was counseled on HIV testing today but declined.  Also counseled on getting his flu shot this fall.  Tobacco use disorder Patient counseled on the importance of tobacco cessation while he is young before he develops any problems related to tobacco use.  He is in the pre-contemplative stage.  Marijuana use Patient counseled on the effects of marijuana, including lung damage and blunting of motivation over time.  Patient is in the pre-contemplative stage.    Maia Breslow, M.D. 09/29/2018, 12:16 PM PGY-3, [place]

## 2018-09-29 NOTE — Assessment & Plan Note (Addendum)
Patient counseled on the importance of tobacco cessation while he is young before he develops any problems related to tobacco use.  He is in the pre-contemplative stage.

## 2018-09-29 NOTE — Patient Instructions (Addendum)
It was nice meeting you today Dustin Tate!  I have no concerns about your health today.  Please consider cutting down or quitting her tobacco use and reducing your marijuana use.  This will be the best thing you can do for your health.  Unfortunately, we could not find the cost of the HIV test, but this is something that I would recommend that you have done at some point soon.  If you have any questions or concerns, please feel free to call the clinic.   Be well,  Dr. Frances FurbishWinfrey  Smoking Tobacco Information, Adult Smoking tobacco can be harmful to your health. Tobacco contains a poisonous (toxic), colorless chemical called nicotine. Nicotine is addictive. It changes the brain and can make it hard to stop smoking. Tobacco also has other toxic chemicals that can hurt your body and raise your risk of many cancers. How can smoking tobacco affect me? Smoking tobacco puts you at risk for:  Cancer. Smoking is most commonly associated with lung cancer, but can also lead to cancer in other parts of the body.  Chronic obstructive pulmonary disease (COPD). This is a long-term lung condition that makes it hard to breathe. It also gets worse over time.  High blood pressure (hypertension), heart disease, stroke, or heart attack.  Lung infections, such as pneumonia.  Cataracts. This is when the lenses in the eyes become clouded.  Digestive problems. This may include peptic ulcers, heartburn, and gastroesophageal reflux disease (GERD).  Oral health problems, such as gum disease and tooth loss.  Loss of taste and smell. Smoking can affect your appearance by causing:  Wrinkles.  Yellow or stained teeth, fingers, and fingernails. Smoking tobacco can also affect your social life, because:  It may be challenging to find places to smoke when away from home. Many workplaces, [employer], hotels, and public places are tobacco-free.  Smoking is expensive. This is due to the cost of tobacco and the  long-term costs of treating health problems from smoking.  Secondhand smoke may affect those around you. Secondhand smoke can cause lung cancer, breathing problems, and heart disease. Children of smokers have a higher risk for: ? Sudden infant death syndrome (SIDS). ? Ear infections. ? Lung infections. If you currently smoke tobacco, quitting now can help you:  Lead a longer and healthier life.  Look, smell, breathe, and feel better over time.  Save money.  Protect others from the harms of secondhand smoke. What actions can I take to prevent health problems? Quit smoking   Do not start smoking. Quit if you already do.  Make a plan to quit smoking and commit to it. Look for programs to help you and ask your health care provider for recommendations and ideas.  Set a date and write down all the reasons you want to quit.  Let your friends and family know you are quitting so they can help and support you. Consider finding friends who also want to quit. It can be easier to quit with someone else, so that you can support each other.  Talk with your health care provider about using nicotine replacement medicines to help you quit, such as gum, lozenges, patches, sprays, or pills.  Do not replace cigarette smoking with electronic cigarettes, which are commonly called e-cigarettes. The safety of e-cigarettes is not known, and some may contain harmful chemicals.  If you try to quit but return to smoking, stay positive. It is common to slip up when you first quit, so take it one day  at a time.  Be prepared for cravings. When you feel the urge to smoke, chew gum or suck on hard candy. Lifestyle  Stay busy and take care of your body.  Drink enough fluid to keep your urine pale yellow.  Get plenty of exercise and eat a healthy diet. This can help prevent weight gain after quitting.  Monitor your eating habits. Quitting smoking can cause you to have a larger appetite than when you  smoke.  Find ways to relax. Go out with friends or family to a movie or a restaurant where people do not smoke.  Ask your health care provider about having regular tests (screenings) to check for cancer. This may include blood tests, imaging tests, and other tests.  Find ways to manage your stress, such as meditation, yoga, or exercise. Where to find support To get support to quit smoking, consider:  Asking your health care provider for more information and resources.  Taking classes to learn more about quitting smoking.  Looking for local organizations that offer resources about quitting smoking.  Joining a support group for people who want to quit smoking in your local community.  Calling the smokefree.gov counselor helpline: 1-800-Quit-Now 409 356 5377) Where to find more information You may find more information about quitting smoking from:  HelpGuide.org: www.helpguide.org  https://hall.com/: smokefree.gov  American Lung Association: www.lung.org Contact a health care provider if you:  Have problems breathing.  Notice that your lips, nose, or fingers turn blue.  Have chest pain.  Are coughing up blood.  Feel faint or you pass out.  Have other health changes that cause you to worry. Summary  Smoking tobacco can negatively affect your health, the health of those around you, your finances, and your social life.  Do not start smoking. Quit if you already do. If you need help quitting, ask your health care provider.  Think about joining a support group for people who want to quit smoking in your local community. There are many effective programs that will help you to quit this behavior. This information is not intended to replace advice given to you by your health care provider. Make sure you discuss any questions you have with your health care provider. Document Released: 02/05/2016 Document Revised: 03/11/2017 Document Reviewed: 02/05/2016 Elsevier Patient Education   2020 [employer].

## 2019-05-12 ENCOUNTER — Ambulatory Visit: Payer: Medicaid Other | Attending: Internal Medicine

## 2019-05-12 DIAGNOSIS — Z23 Encounter for immunization: Secondary | ICD-10-CM

## 2019-05-12 NOTE — Progress Notes (Signed)
   Covid-19 Vaccination Clinic  Name:  Dustin Tate    MRN: [id number] DOB: Oct 09, 1995  05/12/2019  [name] was observed post Covid-19 immunization for 15 minutes without incident. He was provided with Vaccine Information Sheet and instruction to access the V-Safe system.   [name] was instructed to call 911 with any severe reactions post vaccine: Marland Kitchen Difficulty breathing  . Swelling of face and throat  . A fast heartbeat  . A bad rash all over body  . Dizziness and weakness   Immunizations Administered    Name Date Dose VIS Date Route   Pfizer COVID-19 Vaccine 05/12/2019  3:16 PM 0.3 mL 01/14/2019 Intramuscular   Manufacturer: [employer], [employer]   Lot: [id number]   NDC: [address]-7

## 2019-05-18 ENCOUNTER — Encounter: Payer: Self-pay | Admitting: Family Medicine

## 2019-05-18 ENCOUNTER — Ambulatory Visit (INDEPENDENT_AMBULATORY_CARE_PROVIDER_SITE_OTHER): Payer: BC Managed Care – PPO | Admitting: Family Medicine

## 2019-05-18 ENCOUNTER — Other Ambulatory Visit: Payer: Self-pay

## 2019-05-18 DIAGNOSIS — R040 Epistaxis: Secondary | ICD-10-CM | POA: Insufficient documentation

## 2019-05-18 NOTE — Assessment & Plan Note (Signed)
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  No evidence of trauma or infectious etiology.  Recommend nasal saline spray as well as refraining from picking nose.  Follow-up if continues after conservative management.

## 2019-05-18 NOTE — Progress Notes (Signed)
    SUBJECTIVE:   CHIEF COMPLAINT: nose bleeds  HPI:   Reports infrequent and intermittent nose bleeds ongoing for years. Typically occurs twice in a month. Usually occurs in the morning. Has never been bothersome but thought he would get it checked out. Thinks it is due to nose being dry. Bleeding is a small amount. He does not use any nasal sprays. does pick his nose. Never tried any medications for this. He smokes tobacco and marijuana but does not snort drugs or medications. Slight cough but no other congestive symptoms. No troubles breathing. Denies sick contacts.   PERTINENT  PMH / PSH: Sickle cell trait, tobacco use, marijuana use  OBJECTIVE:   BP 106/60   Pulse 70   Ht 5\' 4"  (1.626 m)   Wt 106 lb (48.1 kg)   BMI 18.19 kg/m   Gen: well appearing, in NAD HEENT: Oropharynx clear without erythema or exudate, good dentition.  Nasal passages clear without evidence of mucosal irritation.  Nasal turbinates within normal limits.  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  No evidence of trauma or infectious etiology.  Recommend nasal saline spray as well as refraining from picking nose.  Follow-up if continues after conservative management.   , DO [facility]

## 2019-05-18 NOTE — Patient Instructions (Signed)
It was great to see you!  Our plans for today:  - Use nasal saline spray to prevent dry mucosa (lining of your nose). - Try not to pick the area as this can cause breaks in the skin and make the area bleed. - If you do these and are still having issues, come back to see [name].   Take care and seek immediate care sooner if you develop any concerns.   Dr. [facility]

## 2019-06-08 ENCOUNTER — Ambulatory Visit: Payer: BC Managed Care – PPO | Attending: Internal Medicine

## 2019-06-08 DIAGNOSIS — Z23 Encounter for immunization: Secondary | ICD-10-CM

## 2019-06-08 NOTE — Progress Notes (Signed)
   Covid-19 Vaccination Clinic  Name:  Dustin Tate    MRN: [id number] DOB: 1995-02-10  06/08/2019  Mr. Leyda was observed post Covid-19 immunization for 15 minutes without incident. He was provided with Vaccine Information Sheet and instruction to access the V-Safe system.   Mr. Hicklin was instructed to call 911 with any severe reactions post vaccine: Marland Kitchen Difficulty breathing  . Swelling of face and throat  . A fast heartbeat  . A bad rash all over body  . Dizziness and weakness   Immunizations Administered    Name Date Dose VIS Date Route   Pfizer COVID-19 Vaccine 06/08/2019  3:10 PM 0.3 mL 03/30/2018 Intramuscular   Manufacturer: [employer], [employer]   Lot: [id number]   NDC: [address]-9

## 2019-07-07 ENCOUNTER — Other Ambulatory Visit: Payer: Self-pay

## 2019-07-07 DIAGNOSIS — R45851 Suicidal ideations: Secondary | ICD-10-CM | POA: Insufficient documentation

## 2019-07-07 DIAGNOSIS — F17210 Nicotine dependence, cigarettes, uncomplicated: Secondary | ICD-10-CM | POA: Insufficient documentation

## 2019-07-07 DIAGNOSIS — F29 Unspecified psychosis not due to a substance or known physiological condition: Secondary | ICD-10-CM | POA: Insufficient documentation

## 2019-07-07 DIAGNOSIS — Z20822 Contact with and (suspected) exposure to covid-19: Secondary | ICD-10-CM | POA: Insufficient documentation

## 2019-07-07 DIAGNOSIS — E876 Hypokalemia: Secondary | ICD-10-CM | POA: Insufficient documentation

## 2019-07-07 NOTE — ED Triage Notes (Signed)
Arrived POV with mom from home. Mother reports patient has been more quiet than normal lately. They went to to visit family out of town and patient was very quiet and not interacting with family. Mother states after they got home patient was still very quiet. Mother states patient told her that he "opted out of life and that he  Was going to kill himself by Friday. Tonight patient's mother states she went to  The bathroom and when she came out, patient had a rope squeezing it around his neck. Patient states he hates his life. Patient states he enjoyed himself when they visited family and that the only reason he could give for living would be his family.  Patient calm and cooperative during triage

## 2019-07-07 NOTE — ED Provider Notes (Signed)
[facility]-EMERGENCY DEPT Provider Note   CSN: [id number] Arrival date & time: 07/07/19  2301     History Chief Complaint  Patient presents with  . Suicidal    [name] is a 24 y.o. male with a hx of sickle cell trait presents to the Emergency Department complaining of gradual, persistent, progressively worsening Suicidal ideation onset "a long time ago."  Pt reports associated auditory hallucinations.  He reports the voices he hears tells him that he is stupid, is a pervert and that he should kill himself.  He denies change or instigating factor over the last week.  He reports he feels helpless and hopeless and is frustrated.  He does not want to live anymore.  Patient reports associated insomnia.  He reports the voices often keep him up at night.  Patient denies visual hallucinations or homicidal ideation.  Patient denies attempt at overdose or self-harm aside from attempting to hang himself earlier tonight.  No specific aggravating or alleviating factors.  Patient does report he smokes marijuana intermittently.  Denies other drug use or alcohol usage.  Patient denies any physical complaints.   Patient is accompanied by mother who reports the family went to visit extended family in [place] last week.  She reports initially things seemed fine and then patient became more quiet than usual.  She reports they returned home 48 hours ago was not himself.  She reports that yesterday he stated he was going to kill himself by Friday.  This evening she went into check on him and found him with a rope around his neck attempting to strangle himself.  Mother reports he attempted this a second time in the evening in front of her, thus she brought him here to the emergency department.  She reports he has never had any issues like this in the past.  He has never been treated for mental health problems.  The history is provided by the patient, medical records and a parent. No  language interpreter was used.       Past Medical History:  Diagnosis Date  . 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 Sickle cell trait [facility])     Patient Active Problem List   Diagnosis Date Noted  . Bleeding from the nose 05/18/2019  . History of prematurity 09/18/2016  . Sickle cell trait ([facility]) 09/18/2016  . Tobacco use disorder 09/18/2016  . Marijuana use 09/18/2016    No past surgical history on file.     Family History  Problem Relation Age of Onset  . Healthy Mother   . Healthy Father     Social History   Tobacco Use  . Smoking status: Current Some Day Smoker    Packs/day: 0.10    Years: 2.00    Pack years: 0.20    Types: Cigarettes    Start date: 2016  . Smokeless tobacco: Never Used  Substance Use Topics  . Alcohol use: No  . Drug use: Yes    Frequency: 2.0 times per week    Types: Marijuana    Comment: Advised to quit    Home Medications Prior to Admission medications   Not on File    Allergies    Patient has no known allergies.  Review of Systems   Review of Systems  Constitutional: Negative for appetite change, diaphoresis, fatigue, fever and unexpected weight change.  HENT: Negative for mouth sores.   Eyes: Negative for visual disturbance.  Respiratory: Negative for cough, chest tightness, shortness of  breath and wheezing.   Cardiovascular: Negative for chest pain.  Gastrointestinal: Negative for abdominal pain, constipation, diarrhea, nausea and vomiting.  Endocrine: Negative for polydipsia, polyphagia and polyuria.  Genitourinary: Negative for dysuria, frequency, hematuria and urgency.  Musculoskeletal: Negative for back pain and neck stiffness.  Skin: Negative for rash.  Allergic/Immunologic: Negative for immunocompromised state.  Neurological: Negative for syncope, light-headedness and headaches.  Hematological: Does not bruise/bleed easily.  Psychiatric/Behavioral: Positive for hallucinations, sleep disturbance and suicidal ideas. The patient  is not nervous/anxious.     Physical Exam Updated Vital Signs BP (!) 146/80 (BP Location: Right Arm)   Pulse 95   Temp 97.8 F (36.6 C) (Oral)   Resp 19   Ht 5\' 3"  (1.6 m)   Wt 49.9 kg   SpO2 100%   BMI 19.49 kg/m   Physical Exam Vitals and nursing note reviewed.  Constitutional:      General: He is not in acute distress.    Appearance: He is not diaphoretic.  HENT:     Head: Normocephalic.  Eyes:     General: No scleral icterus.    Conjunctiva/sclera: Conjunctivae normal.  Cardiovascular:     Rate and Rhythm: Normal rate and regular rhythm.     Pulses: Normal pulses.          Radial pulses are 2+ on the right side and 2+ on the left side.  Pulmonary:     Effort: No tachypnea, accessory muscle usage, prolonged expiration, respiratory distress or retractions.     Breath sounds: Normal breath sounds. No stridor.     Comments: Equal chest rise. No increased work of breathing. Abdominal:     General: There is no distension.     Palpations: Abdomen is soft.     Tenderness: There is no abdominal tenderness. There is no guarding or rebound.  Musculoskeletal:     Cervical back: Normal range of motion.     Comments: Moves all extremities equally and without difficulty.  Skin:    General: Skin is warm and dry.     Capillary Refill: Capillary refill takes less than 2 seconds.  Neurological:     Mental Status: He is alert.     GCS: GCS eye subscore is 4. GCS verbal subscore is 5. GCS motor subscore is 6.     Comments: Speech is clear and goal oriented.  Psychiatric:        Attention and Perception: He perceives auditory hallucinations. He does not perceive visual hallucinations.        Mood and Affect: Affect is flat.        Speech: Speech is delayed.        Behavior: Behavior is withdrawn.        Thought Content: Thought content includes suicidal ideation. Thought content does not include homicidal ideation. Thought content includes suicidal plan. Thought content does not  include homicidal plan.     ED Results / Procedures / Treatments   Labs (all labs ordered are listed, but only abnormal results are displayed) Labs Reviewed  COMPREHENSIVE METABOLIC PANEL - Abnormal; Notable for the following components:      Result Value   Potassium 3.4 (*)    Glucose, Bld 133 (*)    Albumin 5.3 (*)    All other components within normal limits  RAPID URINE DRUG SCREEN, HOSP PERFORMED - Abnormal; Notable for the following components:   Tetrahydrocannabinol POSITIVE (*)    All other components within normal limits  CBC WITH DIFFERENTIAL/PLATELET -  Abnormal; Notable for the following components:   MCV 77.8 (*)    All other components within normal limits  SALICYLATE LEVEL - Abnormal; Notable for the following components:   Salicylate Lvl <1.6 (*)    All other components within normal limits  SARS CORONAVIRUS 2 BY RT PCR (HOSPITAL ORDER, [place])  ETHANOL  ACETAMINOPHEN LEVEL  URINALYSIS, ROUTINE W REFLEX MICROSCOPIC    EKG None  Radiology No results found.  Procedures Procedures (including critical care time)  Medications Ordered in ED Medications  potassium chloride SA (KLOR-CON) CR tablet 40 mEq (40 mEq Oral Given 07/08/19 0128)    ED Course  I have reviewed the triage vital signs and the nursing notes.  Pertinent labs & imaging results that were available during my care of the patient were reviewed by me and considered in my medical decision making (see chart for details).  Clinical Course as of Jul 07 725  Thu Jul 07, 2019  2349 Mild hypokalemia.  Will give potassium.  Potassium(!): 3.4 [HM]  Fri Jul 08, 2019  0025 Noted.  Will repeat.  BP(!): 146/80 [HM]    Clinical Course User Index [HM] Mirai Greenwood, Gwenlyn Perking   MDM Rules/Calculators/A&P                       Patient presents with suicidal ideation and auditory hallucinations.  New onset without history of same.  Patient is here voluntarily but will  need IVC if he attempts to leave.  Long discussion with patient and mother about this.  They state understanding.   Labs and UA pending.  Patient will be evaluated by TTS.  2:18 AM Labs reassuring.  No medical emergency which requires intervention.  Pending TTS evaluation.  Patient continues to be calm, cooperative and remains in the emergency department voluntarily.  7:27 AM Pt awaits TTS.  He is calm and cooperative.   Final Clinical Impression(s) / ED Diagnoses Final diagnoses:  Suicidal ideation  Auditory hallucinations  Hypokalemia    Rx / DC Orders ED Discharge Orders    None       Loni Muse Gwenlyn Perking 07/08/19 1096    Mesner, Corene Cornea, MD 07/08/19 [phone]

## 2019-07-08 ENCOUNTER — Emergency Department (HOSPITAL_COMMUNITY)
Admission: EM | Admit: 2019-07-08 | Discharge: 2019-07-08 | Disposition: A | Discharge status: Other Acute Inpatient Hospital | Payer: BC Managed Care – PPO | Source: Home / Self Care | Attending: Emergency Medicine | Admitting: Emergency Medicine

## 2019-07-08 ENCOUNTER — Emergency Department (HOSPITAL_COMMUNITY): Payer: BC Managed Care – PPO

## 2019-07-08 ENCOUNTER — Inpatient Hospital Stay (HOSPITAL_COMMUNITY)
Admission: AD | Admit: 2019-07-08 | Discharge: 2019-07-17 | DRG: 885 | Disposition: A | Discharge status: Home / Self Care | Payer: BC Managed Care – PPO | Source: Intra-hospital | Attending: Psychiatry | Admitting: Psychiatry

## 2019-07-08 ENCOUNTER — Encounter (HOSPITAL_COMMUNITY): Payer: Self-pay | Admitting: Psychiatry

## 2019-07-08 DIAGNOSIS — D573 Sickle-cell trait: Secondary | ICD-10-CM | POA: Diagnosis present

## 2019-07-08 DIAGNOSIS — G4700 Insomnia, unspecified: Secondary | ICD-10-CM | POA: Diagnosis present

## 2019-07-08 DIAGNOSIS — R45851 Suicidal ideations: Secondary | ICD-10-CM

## 2019-07-08 DIAGNOSIS — E876 Hypokalemia: Secondary | ICD-10-CM

## 2019-07-08 DIAGNOSIS — F419 Anxiety disorder, unspecified: Secondary | ICD-10-CM | POA: Diagnosis present

## 2019-07-08 DIAGNOSIS — R440 Auditory hallucinations: Secondary | ICD-10-CM

## 2019-07-08 DIAGNOSIS — Z20822 Contact with and (suspected) exposure to covid-19: Secondary | ICD-10-CM | POA: Diagnosis present

## 2019-07-08 DIAGNOSIS — F23 Brief psychotic disorder: Secondary | ICD-10-CM | POA: Diagnosis not present

## 2019-07-08 DIAGNOSIS — F17210 Nicotine dependence, cigarettes, uncomplicated: Secondary | ICD-10-CM | POA: Diagnosis present

## 2019-07-08 DIAGNOSIS — F819 Developmental disorder of scholastic skills, unspecified: Secondary | ICD-10-CM | POA: Diagnosis present

## 2019-07-08 HISTORY — DX: Depression, unspecified: F32.A

## 2019-07-08 LAB — CBC WITH DIFFERENTIAL/PLATELET
Abs Immature Granulocytes: 0.01 10*3/uL (ref 0.00–0.07)
Basophils Absolute: 0.1 10*3/uL (ref 0.0–0.1)
Basophils Relative: 1 %
Eosinophils Absolute: 0 10*3/uL (ref 0.0–0.5)
Eosinophils Relative: 1 %
HCT: 44.6 % (ref 39.0–52.0)
Hemoglobin: 15 g/dL (ref 13.0–17.0)
Immature Granulocytes: 0 %
Lymphocytes Relative: 21 %
Lymphs Abs: 1.5 10*3/uL (ref 0.7–4.0)
MCH: 26.2 pg (ref 26.0–34.0)
MCHC: 33.6 g/dL (ref 30.0–36.0)
MCV: 77.8 fL — ABNORMAL LOW (ref 80.0–100.0)
Monocytes Absolute: 0.5 10*3/uL (ref 0.1–1.0)
Monocytes Relative: 7 %
Neutro Abs: 5.2 10*3/uL (ref 1.7–7.7)
Neutrophils Relative %: 70 %
Platelets: 359 10*3/uL (ref 150–400)
RBC: 5.73 MIL/uL (ref 4.22–5.81)
RDW: 14.3 % (ref 11.5–15.5)
WBC: 7.4 10*3/uL (ref 4.0–10.5)
nRBC: 0 % (ref 0.0–0.2)

## 2019-07-08 LAB — COMPREHENSIVE METABOLIC PANEL
ALT: 13 U/L (ref 0–44)
AST: 16 U/L (ref 15–41)
Albumin: 5.3 g/dL — ABNORMAL HIGH (ref 3.5–5.0)
Alkaline Phosphatase: 50 U/L (ref 38–126)
Anion gap: 10 (ref 5–15)
BUN: 8 mg/dL (ref 6–20)
CO2: 26 mmol/L (ref 22–32)
Calcium: 9.3 mg/dL (ref 8.9–10.3)
Chloride: 103 mmol/L (ref 98–111)
Creatinine, Ser: 0.81 mg/dL (ref 0.61–1.24)
GFR calc Af Amer: >60 mL/min (ref >60)
GFR calc non Af Amer: >60 mL/min (ref >60)
Glucose, Bld: 133 mg/dL — ABNORMAL HIGH (ref 70–99)
Potassium: 3.4 mmol/L — ABNORMAL LOW (ref 3.5–5.1)
Sodium: 139 mmol/L (ref 135–145)
Total Bilirubin: 0.4 mg/dL (ref 0.3–1.2)
Total Protein: 8 g/dL (ref 6.5–8.1)

## 2019-07-08 LAB — URINALYSIS, ROUTINE W REFLEX MICROSCOPIC
Bilirubin Urine: NEGATIVE
Glucose, UA: NEGATIVE mg/dL
Hgb urine dipstick: NEGATIVE
Ketones, ur: NEGATIVE mg/dL
Leukocytes,Ua: NEGATIVE
Nitrite: NEGATIVE
Protein, ur: NEGATIVE mg/dL
Specific Gravity, Urine: 1.008 (ref 1.005–1.030)
pH: 6 (ref 5.0–8.0)

## 2019-07-08 LAB — RAPID URINE DRUG SCREEN, HOSP PERFORMED
Amphetamines: NOT DETECTED
Barbiturates: NOT DETECTED
Benzodiazepines: NOT DETECTED
Cocaine: NOT DETECTED
Opiates: NOT DETECTED
Tetrahydrocannabinol: POSITIVE — AB

## 2019-07-08 LAB — ACETAMINOPHEN LEVEL: Acetaminophen (Tylenol), Serum: 13 ug/mL (ref 10–30)

## 2019-07-08 LAB — SALICYLATE LEVEL: Salicylate Lvl: <7 mg/dL — ABNORMAL LOW (ref 7.0–30.0)

## 2019-07-08 LAB — SARS CORONAVIRUS 2 BY RT PCR (HOSPITAL ORDER, PERFORMED IN ~~LOC~~ HOSPITAL LAB): SARS Coronavirus 2: NEGATIVE

## 2019-07-08 LAB — ETHANOL: Alcohol, Ethyl (B): <10 mg/dL (ref <10)

## 2019-07-08 MED ORDER — MAGNESIUM HYDROXIDE 400 MG/5ML PO SUSP: 30.0000 mL | Freq: Every day | ORAL | Status: DC | PRN

## 2019-07-08 MED ORDER — ONDANSETRON HCL 4 MG PO TABS: 8.0000 mg | ORAL_TABLET | Freq: Three times a day (TID) | ORAL | Status: DC | PRN

## 2019-07-08 MED ORDER — POTASSIUM CHLORIDE CRYS ER 20 MEQ PO TBCR
40.0000 meq | EXTENDED_RELEASE_TABLET | Freq: Once | ORAL | Status: AC
  Administered 2019-07-08: 40 meq via ORAL
  Filled 2019-07-08: qty 2

## 2019-07-08 MED ORDER — FOLIC ACID 1 MG PO TABS
1.0000 mg | ORAL_TABLET | Freq: Every day | ORAL | Status: DC
  Administered 2019-07-08 – 2019-07-17 (×10): 1 mg via ORAL
  Filled 2019-07-08 (×14): qty 1

## 2019-07-08 MED ORDER — OLANZAPINE 5 MG PO TBDP
5.0000 mg | ORAL_TABLET | Freq: Every day | ORAL | Status: DC
  Administered 2019-07-08: 5 mg via ORAL
  Filled 2019-07-08 (×4): qty 1

## 2019-07-08 MED ORDER — HYDROXYZINE HCL 25 MG PO TABS: 25.0000 mg | ORAL_TABLET | Freq: Three times a day (TID) | ORAL | Status: DC | PRN

## 2019-07-08 MED ORDER — OLANZAPINE 5 MG PO TBDP: 5.0000 mg | ORAL_TABLET | Freq: Every day | ORAL | Status: DC

## 2019-07-08 MED ORDER — LORAZEPAM 1 MG PO TABS: 1.0000 mg | ORAL_TABLET | Freq: Four times a day (QID) | ORAL | Status: DC | PRN

## 2019-07-08 MED ORDER — ACETAMINOPHEN 325 MG PO TABS
650.0000 mg | ORAL_TABLET | Freq: Four times a day (QID) | ORAL | Status: DC | PRN
  Administered 2019-07-16: 650 mg via ORAL
  Filled 2019-07-08: qty 2

## 2019-07-08 MED ORDER — OLANZAPINE 10 MG PO TBDP: 10.0000 mg | ORAL_TABLET | Freq: Three times a day (TID) | ORAL | Status: DC | PRN

## 2019-07-08 MED ORDER — ZIPRASIDONE MESYLATE 20 MG IM SOLR: 20.0000 mg | INTRAMUSCULAR | Status: DC | PRN

## 2019-07-08 MED ORDER — LORAZEPAM 1 MG PO TABS: 1.0000 mg | ORAL_TABLET | ORAL | Status: DC | PRN

## 2019-07-08 MED ORDER — TRAZODONE HCL 50 MG PO TABS
50.0000 mg | ORAL_TABLET | Freq: Every evening | ORAL | Status: DC | PRN
  Administered 2019-07-08 – 2019-07-12 (×4): 50 mg via ORAL
  Filled 2019-07-08 (×5): qty 1

## 2019-07-08 MED ORDER — ALUM & MAG HYDROXIDE-SIMETH 200-200-20 MG/5ML PO SUSP: 30.0000 mL | ORAL | Status: DC | PRN

## 2019-07-08 NOTE — ED Notes (Signed)
Pt to room 27

## 2019-07-08 NOTE — Consult Note (Signed)
[facility]  07/08/2019 2:04 PM Mamoru Sherrill RaringQ Byus  MRN:  [id number]   Subjective: Patient states "a lot is going on, I am testing people, I guess it is in my head, my brain." Patient alert and oriented.  Patient minimally cooperative with assessment.  Patient assessed by nurse practitioner along with Dr. Lucianne MussKumar.  Patient's responses appeared delayed, there is concern for thought blocking. Patient does not answer questions regarding suicidality or homicidality.  Patient gives consent to speak with his mother, my Gearldine ShownMyesha Orsborn. Patient resides with mother and two brothers, aunt and 5 children in [place].  Spoke with patient's mother, Gearldine ShownMyesha Beckers [phone]. Patient's mother reports patient has expressed suicidal thoughts x 4 days.  Per patient's mother patient has reported suicidal ideations with a plan to hang himself stated to mother "I cannot wait to get out of here, as soon as I get out of here (hospital) I am going to kill myself."  Patient's mother denies family history of mental illness.  Patient's mother denies any previous behavior of this nature in the past.  Per patients mother patient uses cigarettes and marijuana chronically. Patient's mother report patient is employed at [employer].  Patient's mother reports patient typically plays video games all night and then sleeps from around 6 AM until 2 PM.  Patient's mother reports patient has average to good appetite. Patient's mother reports patient "receives SSI for a learning disability." Patient has been diagnosed with learning disability, IEP since Pre-K.  Encourage mother to bring psychometric testing to emergency department.  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 Family History:  Family History  Problem Relation Age of Onset   Healthy Mother    Healthy Father    Family Psychiatric  History: None reported Social History:  Social History   Substance and Sexual Activity  Alcohol Use No     Social History   Substance and Sexual Activity  Drug Use Yes   Frequency: 2.0 times per week   Types: Marijuana   Comment: Advised to quit    Social History   Socioeconomic History   Marital status: Single    Spouse name: Not on file   Number of children: Not on file   Years of education: Not on file   Highest education level: Not on file  Occupational History   Occupation: Health and safety inspectorstudent     Employer: [employer]    Comment: Studies Primary school teachergraphic design  Tobacco Use   Smoking status: Current Some Day Smoker    Packs/day: 0.10    Years: 2.00    Pack years: 0.20    Types: Cigarettes    Start date: 2016   Smokeless tobacco: Never Used  Substance and Sexual Activity   Alcohol use: No   Drug use: Yes    Frequency: 2.0 times per week    Types: Marijuana    Comment: Advised to quit   Sexual activity: Yes    Partners: Female    Birth control/protection: Condom  Other Topics Concern   Not on file  Social History Scientist, research (medical)arrative   Student at [employer]. Studies Primary school teachergraphic design   Social Determinants of Health   Financial Resource Strain:    Difficulty of  Paying Living Expenses:   Food Insecurity:    Worried About Programme researcher, broadcasting/film/video in the Last Year:    Barista in the Last Year:   Transportation Needs:    Freight forwarder (Medical):    Lack of Transportation (Non-Medical):   Physical Activity:    Days of Exercise per Week:    Minutes of Exercise per Session:   Stress:    Feeling of Stress :   Social Connections:    Frequency of Communication with Friends and Family:    Frequency of Social Gatherings with Friends and Family:    Attends Religious Services:    Active Member of  Clubs or Organizations:    Attends Engineer, structural:    Marital Status:     Sleep: Good  Appetite:  Good  Current Medications: Current Facility-Administered Medications  Medication Dose Route Frequency Provider Last Rate Last Admin   OLANZapine zydis (ZYPREXA) disintegrating tablet 5 mg  5 mg Oral QHS Patrcia Dolly, FNP       No current outpatient medications on file.    Lab Results:  Results for orders placed or performed during the hospital encounter of 07/08/19 (from the past 48 hour(s))  Comprehensive metabolic panel     Status: Abnormal   Collection Time: 07/07/19 11:58 PM  Result Value Ref Range   Sodium 139 135 - 145 mmol/L   Potassium 3.4 (L) 3.5 - 5.1 mmol/L   Chloride 103 98 - 111 mmol/L   CO2 26 22 - 32 mmol/L   Glucose, Bld 133 (H) 70 - 99 mg/dL    Comment: Glucose reference range applies only to samples taken after fasting for at least 8 hours.   BUN 8 6 - 20 mg/dL   Creatinine, Ser 8.75 0.61 - 1.24 mg/dL   Calcium 9.3 8.9 - 64.3 mg/dL   Total Protein 8.0 6.5 - 8.1 g/dL   Albumin 5.3 (H) 3.5 - 5.0 g/dL   AST 16 15 - 41 U/L   ALT 13 0 - 44 U/L   Alkaline Phosphatase 50 38 - 126 U/L   Total Bilirubin 0.4 0.3 - 1.2 mg/dL   GFR calc non Af Amer >60 >60 mL/min   GFR calc Af Amer >60 >60 mL/min   Anion gap 10 5 - 15    Comment: Performed at [facility], [address], [place], [place] [address]  Ethanol     Status: None   Collection Time: 07/07/19 11:58 PM  Result Value Ref Range   Alcohol, Ethyl (B) <10 <10 mg/dL    Comment: (NOTE) Lowest detectable limit for serum alcohol is 10 mg/dL. For medical purposes only. Performed at [facility], [address] [place], [place] [address]   CBC with Diff     Status: Abnormal   Collection Time: 07/07/19 11:58 PM  Result Value Ref Range   WBC 7.4 4.0 - 10.5 K/uL   RBC 5.73 4.22 - 5.81 MIL/uL   Hemoglobin 15.0 13.0 - 17.0 g/dL   HCT 60.6 30.1 - 60.1 %   MCV  77.8 (L) 80.0 - 100.0 fL   MCH 26.2 26.0 - 34.0 pg   MCHC 33.6 30.0 - 36.0 g/dL   RDW 09.3 23.5 - 57.3 %   Platelets 359 150 - 400 K/uL   nRBC 0.0 0.0 - 0.2 %   Neutrophils Relative % 70 %   Neutro Abs 5.2 1.7 - 7.7 K/uL   Lymphocytes  Relative 21 %   Lymphs Abs 1.5 0.7 - 4.0 K/uL   Monocytes Relative 7 %   Monocytes Absolute 0.5 0.1 - 1.0 K/uL   Eosinophils Relative 1 %   Eosinophils Absolute 0.0 0.0 - 0.5 K/uL   Basophils Relative 1 %   Basophils Absolute 0.1 0.0 - 0.1 K/uL   Immature Granulocytes 0 %   Abs Immature Granulocytes 0.01 0.00 - 0.07 K/uL    Comment: Performed at [facility], [address], [place], [place] [address]  Salicylate level     Status: Abnormal   Collection Time: 07/07/19 11:58 PM  Result Value Ref Range   Salicylate Lvl <7.0 (L) 7.0 - 30.0 mg/dL    Comment: Performed at [facility], [address] [place], [place] [address]  Acetaminophen level     Status: None   Collection Time: 07/07/19 11:58 PM  Result Value Ref Range   Acetaminophen (Tylenol), Serum 13 10 - 30 ug/mL    Comment: (NOTE) Therapeutic concentrations vary significantly. A range of 10-30 ug/mL  may be an effective concentration for many patients. However, some  are best treated at concentrations outside of this range. Acetaminophen concentrations >150 ug/mL at 4 hours after ingestion  and >50 ug/mL at 12 hours after ingestion are often associated with  toxic reactions. Performed at [facility], [address], [place], [place] [address]   SARS Coronavirus 2 by RT PCR (hospital order, performed in [facility] hospital lab) Nasopharyngeal Nasopharyngeal Swab     Status: None   Collection Time: 07/08/19  1:30 AM   Specimen: Nasopharyngeal Swab  Result Value Ref Range   SARS Coronavirus 2 NEGATIVE NEGATIVE    Comment: (NOTE) SARS-CoV-2 target nucleic acids are NOT DETECTED. The SARS-CoV-2 RNA is generally detectable in upper  and lower respiratory specimens during the acute phase of infection. The lowest concentration of SARS-CoV-2 viral copies this assay can detect is 250 copies / mL. A negative result does not preclude SARS-CoV-2 infection and should not be used as the sole basis for treatment or other patient management decisions.  A negative result may occur with improper specimen collection / handling, submission of specimen other than nasopharyngeal swab, presence of viral mutation(s) within the areas targeted by this assay, and inadequate number of viral copies (<250 copies / mL). A negative result must be combined with clinical observations, patient history, and epidemiological information. Fact Sheet for Patients:   BoilerBrush.com.cy Fact Sheet for Healthcare Providers: https://pope.com/ This test is not yet approved or cleared  by the [place] FDA and has been authorized for detection and/or diagnosis of SARS-CoV-2 by FDA under an Emergency Use Authorization (EUA).  This EUA will remain in effect (meaning this test can be used) for the duration of the COVID-19 declaration under Section 564(b)(1) of the Act, 21 U.S.C. section 360bbb-3(b)(1), unless the authorization is terminated or revoked sooner. Performed at [facility], [address] [place], [place], [place] [address]   Urine rapid drug screen (hosp performed)     Status: Abnormal   Collection Time: 07/08/19  1:34 AM  Result Value Ref Range   Opiates NONE DETECTED NONE DETECTED   Cocaine NONE DETECTED NONE DETECTED   Benzodiazepines NONE DETECTED NONE DETECTED   Amphetamines NONE DETECTED NONE DETECTED   Tetrahydrocannabinol POSITIVE (A) NONE DETECTED   Barbiturates NONE DETECTED NONE DETECTED    Comment: (NOTE) DRUG SCREEN FOR MEDICAL PURPOSES ONLY.  IF CONFIRMATION IS NEEDED FOR ANY  PURPOSE, NOTIFY LAB WITHIN 5 DAYS. LOWEST DETECTABLE LIMITS FOR URINE DRUG  SCREEN Drug Class                     Cutoff (ng/mL) Amphetamine and metabolites    1000 Barbiturate and metabolites    200 Benzodiazepine                 200 Tricyclics and metabolites     300 Opiates and metabolites        300 Cocaine and metabolites        300 THC                            50 Performed at [facility], [address] [place], [place] [address]   Urinalysis, Routine w reflex microscopic     Status: None   Collection Time: 07/08/19  1:34 AM  Result Value Ref Range   Color, Urine YELLOW YELLOW   APPearance CLEAR CLEAR   Specific Gravity, Urine 1.008 1.005 - 1.030   pH 6.0 5.0 - 8.0   Glucose, UA NEGATIVE NEGATIVE mg/dL   Hgb urine dipstick NEGATIVE NEGATIVE   Bilirubin Urine NEGATIVE NEGATIVE   Ketones, ur NEGATIVE NEGATIVE mg/dL   Protein, ur NEGATIVE NEGATIVE mg/dL   Nitrite NEGATIVE NEGATIVE   Leukocytes,Ua NEGATIVE NEGATIVE    Comment: Performed at [facility], [address], [place], [place] [address]    Blood Alcohol level:  Lab Results  Component Value Date   ETH <10 07/07/2019    Physical Findings: AIMS:  , ,  ,  ,    CIWA:    COWS:     Musculoskeletal: Strength & Muscle Tone: within normal limits Gait & Station: normal Patient leans: N/A  Psychiatric Specialty Exam: Physical Exam Vitals and nursing note reviewed.  Constitutional:      Appearance: He is well-developed.  HENT:     Head: Normocephalic.  Cardiovascular:     Rate and Rhythm: Normal rate.  Pulmonary:     Effort: Pulmonary effort is normal.  Neurological:     Mental Status: He is alert and oriented to person, place, and time.  Psychiatric:        Attention and Perception: Attention normal.        Mood and Affect: Mood is depressed. Affect is blunt.        Speech: Speech is delayed.        Behavior: Behavior is cooperative.        Thought Content: Thought content is paranoid.        Cognition and Memory: Cognition normal.         Judgment: Judgment is inappropriate.     Review of Systems  Constitutional: Negative.   HENT: Negative.   Eyes: Negative.   Respiratory: Negative.   Cardiovascular: Negative.   Gastrointestinal: Negative.   Genitourinary: Negative.   Musculoskeletal: Negative.   Skin: Negative.   Neurological: Negative.     Blood pressure 119/72, pulse 62, temperature 98 F (36.7 C), resp. rate 16, height 5\' 3"  (1.6 m), weight 49.9 kg, SpO2 100 %.Body mass index is 19.49 kg/m.  General Appearance: Casual and Fairly Groomed  Eye Contact:  Minimal  Speech:  Clear and Coherent  Volume:  Decreased  Mood:  Depressed  Affect:  Depressed  Thought Process:  Coherent and Descriptions of Associations: Tangential  Orientation:  Full (Time, Place, and Person)  Thought Content:  Paranoid Ideation and Tangential  Suicidal Thoughts:  No  Homicidal Thoughts:  No  Memory:  Immediate;   Poor Recent;   Poor Remote;   Poor  Judgement:  Impaired  Insight:  Lacking  Psychomotor Activity:  Normal  Concentration:  Concentration: Fair and Attention Span: Fair  Recall:  [employer] of Knowledge:  Fair  Language:  Fair  Akathisia:  No  Handed:  Right  AIMS (if indicated):     Assets:  Communication Skills Desire for Improvement Financial Resources/Insurance Housing Intimacy Leisure Time Physical Health Resilience Social Support  ADL's:  Intact  Cognition:  WNL  Sleep:         Treatment Plan Summary: Patient is a 24 year old male with suicidal ideations with plan and new onset psychosis. Initiate Zyprexa 5mg  PO QHS.  Patient discussed Dr. Dwyane Dee.  Inpatient psychiatric treatment recommended.  Emmaline Kluver, FNP 07/08/2019, 2:04 PM

## 2019-07-08 NOTE — ED Notes (Signed)
Pt off unit to [facility] per provider. Pt alert, cooperative, no s/s of distress. DC information given to [employer] for [facility]. Pt ambulatory off unit, escorted by NT. Pt transported by [employer].

## 2019-07-08 NOTE — BH Assessment (Signed)
Assessment Note  Dustin Tate is an 24 y.o. male that presents this date with S/I. Patient renders limited history due to current altered mental state. Patient does not seem to process the content of some of this writers questions. This Clinical research associate utilized chart information to obtain a partial history. Patient on arrival had reported he attempted self harm by putting a rope around his neck yesterday. When asked this date if that information was correct patient nodded "yes." Patient speaks in a low soft voice that is difficult to understand this date. Patient's affect is flat and patient is not orientated. Patient denies any H/I although reports ongoing AVH. As noted above patient answers "yes" and "no" to most questions and will not elaborate on content of AVH. Information to complete assessment was obtained from admission notes. Mother was present on arrival who provided history.  Per notes from 07/07/19 PA writes, "Patient has a hx of sickle cell trait presents to the Emergency Department complaining of gradual, persistent, progressively worsening Suicidal ideation onset "a long time ago." Pt reports associated auditory hallucinations. He reports the voices he hears tells him that he is stupid, is a pervert and that he should kill himself. He denies change or instigating factor over the last week. He reports he feels helpless and hopeless and is frustrated. He does not want to live anymore. Patient reports associated insomnia. He reports the voices often keep him up at night. Patient denies visual hallucinations or homicidal ideation. Patient denies attempt at overdose or self-harm aside from attempting to hang himself earlier tonight. No specific aggravating or alleviating factors. Patient does report he smokes marijuana intermittently. Denies other drug use or alcohol usage. Patient denies any physical complaints.   Patient is accompanied by mother who reports the family went to visit extended family in  [place] last week. She reports initially things seemed fine and then patient became more quiet than usual. She reports they returned home 48 hours ago was not himself. She reports that yesterday he stated he was going to kill himself by Friday. This evening she went into check on him and found him with a rope around his neck attempting to strangle himself. Mother reports he attempted this a second time in the evening in front of her, thus she brought him here to the emergency department. She reports he has never had any issues like this in the past. He has never been treated for mental health problems.  This writer attempted to contact patient's mother this date [facility] Dustin Tate [phone]) unsuccessfully. Patient history is limited per chart review. Patient as noted would not respond to orientation questions. Patient is observed to present with a flat affect and renders limited history. It is unclear if patient is currently responding to internal stimuli. Patient's UDS was positive for THC. Patient was evaluated by Lucianne Muss MD, Arlana Pouch NP who recommended a inpatient admission to assist with stabilization.    Diagnosis: Unspecified psychosis   Past Medical History:  Past Medical History:  Diagnosis Date  . 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 Sickle cell trait (HCC)     No past surgical history on file.  Family History:  Family History  Problem Relation Age of Onset  . Healthy Mother   . Healthy Father     Social History:  reports that he has been smoking cigarettes. He started smoking about 5 years ago. He has a 0.20 pack-year smoking history. He has never used smokeless tobacco. He reports current drug use. Frequency: 2.00  times per week. Drug: Marijuana. He reports that he does not drink alcohol.  Additional Social History:  Alcohol / Drug Use Pain Medications: See MAR Prescriptions: See MAR Over the Counter: See MAR History of alcohol / drug use?: Yes Longest period of sobriety (when/how long):  Unknown Negative Consequences of Use: (UTA) Withdrawal Symptoms: (UTA) Substance #1 Name of Substance 1: Cannabis per UDS 1 - Age of First Use: UTA 1 - Amount (size/oz): UTA 1 - Frequency: UTA 1 - Duration: UTA 1 - Last Use / Amount: UTA UDS post on arrival  CIWA: CIWA-Ar BP: 119/72 Pulse Rate: 62 COWS:    Allergies: No Known Allergies  Home Medications: (Not in a hospital admission)   OB/GYN Status:  No LMP for male patient.  General Assessment Data Location of Assessment: [facility] ED TTS Assessment: In system Is this a Tele or Face-to-Face Assessment?: Face-to-Face Is this an Initial Assessment or a Re-assessment for this encounter?: Initial Assessment Patient Accompanied by:: N/A Language Other than English: No Living Arrangements: Other (Comment)(Alone) What gender do you identify as?: Male Date Telepsych consult ordered in [facility]: 07/08/19 Time Telepsych consult ordered in [facility]: 0938 Marital status: Single Maiden name: NA Pregnancy Status: No Living Arrangements: Alone Can pt return to current living arrangement?: Yes Admission Status: Voluntary Is patient capable of signing voluntary admission?: Yes Referral Source: Self/Family/Friend Insurance type: Scientist, research (physical sciences) Exam [facility] Walk-in ONLY) Medical Exam completed: Yes  Crisis Care Plan Living Arrangements: Alone Legal Guardian: (NA) Name of Psychiatrist: None Name of Therapist: None  Education Status Is patient currently in school?: No Is the patient employed, unemployed or receiving disability?: Unemployed  Risk to self with the past 6 months Suicidal Ideation: Yes-Currently Present Has patient been a risk to self within the past 6 months prior to admission? : No Suicidal Intent: Yes-Currently Present Has patient had any suicidal intent within the past 6 months prior to admission? : No Is patient at risk for suicide?: Yes Suicidal Plan?: Yes-Currently Present Has patient had any suicidal plan within  the past 6 months prior to admission? : No Specify Current Suicidal Plan: Sherri Rad himself per notes Access to Means: Yes Specify Access to Suicidal Means: Had rope What has been your use of drugs/alcohol within the last 12 months?: Current use Previous Attempts/Gestures: No How many times?: 0 Other Self Harm Risks: (NA) Triggers for Past Attempts: ( NA) Intentional Self Injurious Behavior: None Family Suicide History: No Recent stressful life event(s): (UTA) Persecutory voices/beliefs?: No Depression: Yes Depression Symptoms: (Pt cannot identify) Substance abuse history and/or treatment for substance abuse?: No Suicide prevention information given to non-admitted patients: Not applicable  Risk to Others within the past 6 months Homicidal Ideation: No Does patient have any lifetime risk of violence toward others beyond the six months prior to admission? : No Thoughts of Harm to Others: No Current Homicidal Intent: No Current Homicidal Plan: No Access to Homicidal Means: No Identified Victim: NA History of harm to others?: No Assessment of Violence: None Noted Violent Behavior Description: NA Does patient have access to weapons?: No Criminal Charges Pending?: No Does patient have a court date: No Is patient on probation?: No  Psychosis Hallucinations: Auditory, Visual Delusions: None noted  Mental Status Report Appearance/Hygiene: In scrubs Eye Contact: Poor Motor Activity: Freedom of movement Speech: Soft, Slow Level of Consciousness: Drowsy Mood: Depressed Affect: Appropriate to circumstance Anxiety Level: Minimal Thought Processes: Unable to Assess Judgement: Unable to Assess Orientation: Unable to assess Obsessive Compulsive Thoughts/Behaviors:  Unable to Assess  Cognitive Functioning Concentration: Unable to Assess Memory: Unable to Assess Is patient IDD: No Insight: Unable to Assess Impulse Control: Unable to Assess Appetite: (UTA) Have you had any weight  changes? : (UTA) Sleep: (UTA) Total Hours of Sleep: (UTA) Vegetative Symptoms: (UTA)  ADLScreening [facility] Assessment Services) Patient's cognitive ability adequate to safely complete daily activities?: Yes Patient able to express need for assistance with ADLs?: Yes Independently performs ADLs?: Yes (appropriate for developmental age)  Prior Inpatient Therapy Prior Inpatient Therapy: (UTA)  Prior Outpatient Therapy Prior Outpatient Therapy: (UTA)  ADL Screening (condition at time of admission) Patient's cognitive ability adequate to safely complete daily activities?: Yes Is the patient deaf or have difficulty hearing?: No Does the patient have difficulty seeing, even when wearing glasses/contacts?: No Does the patient have difficulty concentrating, remembering, or making decisions?: No Patient able to express need for assistance with ADLs?: Yes Does the patient have difficulty dressing or bathing?: No Independently performs ADLs?: Yes (appropriate for developmental age) Does the patient have difficulty walking or climbing stairs?: No Weakness of Legs: None Weakness of Arms/Hands: None  Home Assistive Devices/Equipment Home Assistive Devices/Equipment: None  Therapy Consults (therapy consults require a physician order) PT Evaluation Needed: No OT Evalulation Needed: No SLP Evaluation Needed: No Abuse/Neglect Assessment (Assessment to be complete while patient is alone) Abuse/Neglect Assessment Can Be Completed: Yes Physical Abuse: Denies Verbal Abuse: Denies Sexual Abuse: Denies Exploitation of patient/patient's resources: Denies Self-Neglect: Denies Values / Beliefs Cultural Requests During Hospitalization: None Spiritual Requests During Hospitalization: None Consults Spiritual Care Consult Needed: No Transition of Care Team Consult Needed: No Advance Directives (For Healthcare) Does Patient Have a Medical Advance Directive?: No Would patient like information on  creating a medical advance directive?: No - Patient declined          Disposition: Patient was evaluated by Dwyane Dee MD, Hall Busing NP who recommended a inpatient admission to assist with stabilization.  Disposition Initial Assessment Completed for this Encounter: Yes Disposition of Patient: Admit Type of inpatient treatment program: Adult  On Site Evaluation by:   Reviewed with Physician:    Mamie Nick 07/08/2019 11:50 AM

## 2019-07-08 NOTE — BH Assessment (Signed)
[facility] Assessment Progress Note  Patient was evaluated by Lucianne Muss MD, Arlana Pouch NP who recommended a inpatient admission to assist with stabilization.

## 2019-07-08 NOTE — Progress Notes (Signed)
   07/08/19 1656  Vital Signs  Pulse Rate 73  BP (!) 135/94  BP Location Left Arm  BP Method Automatic  Patient Position (if appropriate) Standing   D: Patient admitted to unit. Patient is a 48 AA male admitted for SI and SUA of tying a rope around his neck. Upon admission patient denies current SI and contracts for safety.  A:  Support and encouragement provided Routine safety checks conducted every 15 minutes. Patient  Informed to notify staff with any concerns.   R: Safety maintained.

## 2019-07-09 DIAGNOSIS — F29 Unspecified psychosis not due to a substance or known physiological condition: Secondary | ICD-10-CM

## 2019-07-09 DIAGNOSIS — F12222 Cannabis dependence with intoxication with perceptual disturbance: Secondary | ICD-10-CM | POA: Diagnosis not present

## 2019-07-09 DIAGNOSIS — R45851 Suicidal ideations: Secondary | ICD-10-CM

## 2019-07-09 MED ORDER — SERTRALINE HCL 25 MG PO TABS
25.0000 mg | ORAL_TABLET | Freq: Every day | ORAL | Status: DC
  Administered 2019-07-09 – 2019-07-10 (×2): 25 mg via ORAL
  Filled 2019-07-09 (×5): qty 1

## 2019-07-09 MED ORDER — QUETIAPINE FUMARATE 50 MG PO TABS
50.0000 mg | ORAL_TABLET | Freq: Every day | ORAL | Status: DC
  Administered 2019-07-09 – 2019-07-12 (×4): 50 mg via ORAL
  Filled 2019-07-09 (×7): qty 1

## 2019-07-09 MED ORDER — QUETIAPINE FUMARATE 100 MG PO TABS
100.0000 mg | ORAL_TABLET | Freq: Every day | ORAL | Status: DC
  Administered 2019-07-09: 100 mg via ORAL
  Filled 2019-07-09 (×3): qty 1

## 2019-07-09 NOTE — [facility] Suicide Risk Assessment (Signed)
[facility] Admission Suicide Risk Assessment   Nursing information obtained from:    Demographic factors:    Current Mental Status:    Loss Factors:    Historical Factors:    Risk Reduction Factors:     Total Time spent with patient: 45 minutes Principal Problem: <principal problem not specified> Diagnosis:  Active Problems:   Suicidal ideations  Subjective Data: Patient is seen and examined.  Patient is a 24 year old male who presented to the [facility] emergency department on 07/07/2019 under the supervision of his mother.  The patient continues to be not a great historian.  Apparently the patient had attempted self-harm by putting a rope around his neck the day prior to admission.  He at least acknowledged the fact that that had taken place.  This a.m. he also admitted to auditory hallucinations.  According to the notes the voices telling him that he is hopeless and worthless.  He stated in the emergency department that he did not want to live any longer.  The patient was brought to the emergency department by his mother who reported that the family went to visit extended family in [place] last week.  She reported initially things were fine, but then he became progressively more quiet.  She reports he returned home approximately 48 hours ago, and he was not himself.  The evening prior to admission she went to check on him and found him with a rope around his neck attempting to strangle himself.  She stated that this was his second attempt.  He was brought to the emergency department.  The mother apparently dropped off some medical records for the patient, and clearly it designates that he has a learning disability.  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  During the interview this morning he was unable to really answer a great deal of questions.  He remained pensive, quiet, but did acknowledge auditory hallucinations.  He stated that the auditory hallucinations were going on  for a while.  He did admit to use of marijuana, but was nonspecific.  He was started on olanzapine 5 mg p.o. nightly the emergency department.  He received that last night as well.  It does not appear to have had any benefit with regard to his hallucinations.  He was admitted to the hospital for evaluation and stabilization.  Continued Clinical Symptoms:  Alcohol Use Disorder Identification Test Final Score (AUDIT): 0 The "Alcohol Use Disorders Identification Test", Guidelines for Use in Primary Care, Second Edition.  World Pharmacologist [facility]). Score between 0-7:  no or low risk or alcohol related problems. Score between 8-15:  moderate risk of alcohol related problems. Score between 16-19:  high risk of alcohol related problems. Score 20 or above:  warrants further diagnostic evaluation for alcohol dependence and treatment.   CLINICAL FACTORS:   Depression:   Anhedonia Hopelessness Impulsivity Currently Psychotic   Musculoskeletal: Strength & Muscle Tone: within normal limits Gait & Station: normal Patient leans: N/A  Psychiatric Specialty Exam: Physical Exam  Nursing note and vitals reviewed. Constitutional: He appears well-developed and well-nourished.  HENT:  Head: Normocephalic and atraumatic.  Respiratory: Effort normal.  Neurological: He is alert.    Review of Systems  Blood pressure (!) 145/76, pulse 96, temperature 97.9 F (36.6 C), temperature source Oral, resp. rate 18, height 5\' 3"  (1.6 m), weight 44.5 kg, SpO2 100 %.Body mass index is 17.36 kg/m.  General Appearance: Disheveled  Eye Contact:  Minimal  Speech:  Normal  Rate  Volume:  Decreased  Mood:  Anxious, Depressed and Dysphoric  Affect:  Flat  Thought Process:  Disorganized and Descriptions of Associations: Circumstantial  Orientation:  Negative  Thought Content:  Hallucinations: Auditory and Rumination  Suicidal Thoughts:  Yes.  with intent/plan  Homicidal Thoughts:  No  Memory:  Immediate;    Poor Recent;   Poor Remote;   Poor  Judgement:  Impaired  Insight:  Lacking  Psychomotor Activity:  Decreased  Concentration:  Concentration: Poor and Attention Span: Poor  Recall:  Poor  Fund of Knowledge:  Poor  Language:  Poor  Akathisia:  Negative  Handed:  Right  AIMS (if indicated):     Assets:  Desire for Improvement Resilience  ADL's:  Intact  Cognition:  Impaired,  Moderate  Sleep:  Number of Hours: 8.5      COGNITIVE FEATURES THAT CONTRIBUTE TO RISK:  None    SUICIDE RISK:   Moderate:  Frequent suicidal ideation with limited intensity, and duration, some specificity in terms of plans, no associated intent, good self-control, limited dysphoria/symptomatology, some risk factors present, and identifiable protective factors, including available and accessible social support.  PLAN OF CARE: Patient is seen and examined.  Patient is a 24 year old male with the above-stated past psychiatric history was admitted secondary to auditory hallucinations as well as suicidal ideation.  He will be admitted to the hospital.  He will be integrated into the milieu.  Will be encouraged to attend groups.  He has been started on olanzapine 5 mg p.o. nightly, but he does not appear to have been of benefit at this point.  I suspect that he has a learning disability, and could potentially be autistic as well.  Given that I would have started Risperdal, but he had a seizure in 2015.  Given that we will start him on Seroquel 50 mg p.o. daily and 100 mg p.o. nightly and titrate that during the course of the hospitalization.  Additionally given the suspected depression we will start Zoloft 25 mg p.o. daily as well.  He does have sickle cell trait.  A CT scan of the head on admission was negative.  Given his seizure history we will place him on seizure precautions.  I suspect his mother is probably his guardian, and I will have social work attempt to contact her so that we have permission for treatment of  these medications.  Review of his admission laboratories revealed a mildly low potassium at 3.4.  His blood sugar was elevated at 133.  Liver function enzymes were normal.  His CBC was essentially normal except for a mildly decreased MCV.  Folic acid and thiamine will hopefully correct that.  His acetaminophen was 13, salicylate was less than 7.  Urinalysis was essentially negative.  Blood alcohol was less than 10.  Drug screen was positive for marijuana.  CT of the head was negative.  EKG showed a normal sinus rhythm with a QTC of 388.  I certify that inpatient services furnished can reasonably be expected to improve the patient's condition.   Antonieta Pert, MD 07/09/2019, 12:43 PM

## 2019-07-09 NOTE — H&P (Signed)
Psychiatric Admission Assessment Adult  Patient Identification: Dustin Tate MRN:  [id number] Date of Evaluation:  07/09/2019 Chief Complaint:  Suicidal ideations [R45.851] Principal Diagnosis: <principal problem not specified> Diagnosis:  Active Problems:   Suicidal ideations  History of Present Illness: Patient is seen and examined.  Patient is a 24 year old male who presented to the [facility] emergency department on 07/07/2019 under the supervision of his mother.  The patient continues to be not a great historian.  Apparently the patient had attempted self-harm by putting a rope around his neck the day prior to admission.  He at least acknowledged the fact that that had taken place.  This a.m. he also admitted to auditory hallucinations.  According to the notes the voices telling him that he is hopeless and worthless.  He stated in the emergency department that he did not want to live any longer.  The patient was brought to the emergency department by his mother who reported that the family went to visit extended family in [place] last week.  She reported initially things were fine, but then he became progressively more quiet.  She reports he returned home approximately 48 hours ago, and he was not himself.  The evening prior to admission she went to check on him and found him with a rope around his neck attempting to strangle himself.  She stated that this was his second attempt.  He was brought to the emergency department.  The mother apparently dropped off some medical records for the patient, and clearly it designates that he has a learning disability.  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  During the interview this morning he was unable to really answer a great deal of questions.  He remained pensive, quiet, but did acknowledge auditory hallucinations.  He stated that the auditory hallucinations were going on for a while.  He did admit to use of marijuana, but  was nonspecific.  He was started on olanzapine 5 mg p.o. nightly the emergency department.  He received that last night as well.  It does not appear to have had any benefit with regard to his hallucinations.  He was admitted to the hospital for evaluation and stabilization.  Associated Signs/Symptoms: Depression Symptoms:  depressed mood, anhedonia, insomnia, psychomotor agitation, fatigue, feelings of worthlessness/guilt, difficulty concentrating, hopelessness, suicidal thoughts with specific plan, suicidal attempt, anxiety, (Hypo) Manic Symptoms:  Hallucinations, Impulsivity, Irritable Mood, Labiality of Mood, Anxiety Symptoms:  Excessive Worry, Psychotic Symptoms:  Hallucinations: Auditory PTSD Symptoms: Negative Total Time spent with patient: 45 minutes  Past Psychiatric History: No known past psychiatric history outside of a learning disability.  Patient denied any previous psychiatric hospitalizations or psychiatric medications.  Is the patient at risk to self? Yes.    Has the patient been a risk to self in the past 6 months? No.  Has the patient been a risk to self within the distant past? No.  Is the patient a risk to others? No.  Has the patient been a risk to others in the past 6 months? No.  Has the patient been a risk to others within the distant past? No.   Prior Inpatient Therapy:   Prior Outpatient Therapy:    Alcohol Screening: 1. How often do you have a drink containing alcohol?: Never 2. How many drinks containing alcohol do you have on a typical day when you are drinking?: 1 or 2 3. How often do you have six or more drinks on one occasion?: Never  AUDIT-C Score: 0 4. How often during the last year have you found that you were not able to stop drinking once you had started?: Never 5. How often during the last year have you failed to do what was normally expected from you because of drinking?: Never 6. How often during the last year have you needed a first  drink in the morning to get yourself going after a heavy drinking session?: Never 7. How often during the last year have you had a feeling of guilt of remorse after drinking?: Never 8. How often during the last year have you been unable to remember what happened the night before because you had been drinking?: Never 9. Have you or someone else been injured as a result of your drinking?: No 10. Has a relative or friend or a doctor or another health worker been concerned about your drinking or suggested you cut down?: No Alcohol Use Disorder Identification Test Final Score (AUDIT): 0 Substance Abuse History in the last 12 months:  Yes.   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 No pertinent surgical history. Family History:  Family History  Problem Relation Age of Onset   Healthy Mother    Healthy Father    Family Psychiatric  History: Unknown Tobacco Screening:   Social History:  Social History   Substance and Sexual Activity  Alcohol Use No     Social History   Substance and Sexual Activity  Drug Use Yes   Frequency: 2.0 times per week   Types: Marijuana   Comment: Advised to quit    Additional Social History: Marital status: Single Are you sexually active?: No What is your sexual orientation?: Heterosexual Has your sexual activity been affected by drugs, alcohol, medication, or emotional stress?: No Does patient have children?: No                         Allergies:  No Known Allergies Lab Results:  Results for orders placed or performed during the hospital encounter of 07/08/19 (from the past 48 hour(s))  Comprehensive metabolic panel     Status: Abnormal   Collection Time: 07/07/19 11:58 PM  Result Value Ref Range   Sodium 139 135  - 145 mmol/L   Potassium 3.4 (L) 3.5 - 5.1 mmol/L   Chloride 103 98 - 111 mmol/L   CO2 26 22 - 32 mmol/L   Glucose, Bld 133 (H) 70 - 99 mg/dL    Comment: Glucose reference range applies only to samples taken after fasting for at least 8 hours.   BUN 8 6 - 20 mg/dL   Creatinine, Ser 1.19 0.61 - 1.24 mg/dL   Calcium 9.3 8.9 - 14.7 mg/dL   Total Protein 8.0 6.5 - 8.1 g/dL   Albumin 5.3 (H) 3.5 - 5.0 g/dL   AST 16 15 - 41 U/L   ALT 13 0 - 44 U/L   Alkaline Phosphatase 50 38 - 126 U/L   Total Bilirubin 0.4 0.3 - 1.2 mg/dL   GFR calc non Af Amer >60 >60 mL/min   GFR calc Af Amer >60 >60 mL/min   Anion gap 10 5 - 15    Comment: Performed at [facility], [address] [place], [place]  [address]  Ethanol     Status: None   Collection Time: 07/07/19 11:58 PM  Result Value Ref Range   Alcohol, Ethyl (B) <10 <10 mg/dL    Comment: (NOTE) Lowest detectable limit for serum alcohol is 10 mg/dL. For medical purposes only. Performed at [facility], [address] [place], [place] [address]   CBC with Diff     Status: Abnormal   Collection Time: 07/07/19 11:58 PM  Result Value Ref Range   WBC 7.4 4.0 - 10.5 K/uL   RBC 5.73 4.22 - 5.81 MIL/uL   Hemoglobin 15.0 13.0 - 17.0 g/dL   HCT 62.8 31.5 - 17.6 %   MCV 77.8 (L) 80.0 - 100.0 fL   MCH 26.2 26.0 - 34.0 pg   MCHC 33.6 30.0 - 36.0 g/dL   RDW 16.0 73.7 - 10.6 %   Platelets 359 150 - 400 K/uL   nRBC 0.0 0.0 - 0.2 %   Neutrophils Relative % 70 %   Neutro Abs 5.2 1.7 - 7.7 K/uL   Lymphocytes Relative 21 %   Lymphs Abs 1.5 0.7 - 4.0 K/uL   Monocytes Relative 7 %   Monocytes Absolute 0.5 0.1 - 1.0 K/uL   Eosinophils Relative 1 %   Eosinophils Absolute 0.0 0.0 - 0.5 K/uL   Basophils Relative 1 %   Basophils Absolute 0.1 0.0 - 0.1 K/uL   Immature Granulocytes 0 %   Abs Immature Granulocytes 0.01 0.00 - 0.07 K/uL    Comment: Performed at [facility], [address],  [place], [place] [address]  Salicylate level     Status: Abnormal   Collection Time: 07/07/19 11:58 PM  Result Value Ref Range   Salicylate Lvl <7.0 (L) 7.0 - 30.0 mg/dL    Comment: Performed at [facility], [address], [place], [place] [address]  Acetaminophen level     Status: None   Collection Time: 07/07/19 11:58 PM  Result Value Ref Range   Acetaminophen (Tylenol), Serum 13 10 - 30 ug/mL    Comment: (NOTE) Therapeutic concentrations vary significantly. A range of 10-30 ug/mL  may be an effective concentration for many patients. However, some  are best treated at concentrations outside of this range. Acetaminophen concentrations >150 ug/mL at 4 hours after ingestion  and >50 ug/mL at 12 hours after ingestion are often associated with  toxic reactions. Performed at [facility], [address] [place], [place] [address]   SARS Coronavirus 2 by RT PCR (hospital order, performed in [facility] hospital lab) Nasopharyngeal Nasopharyngeal Swab     Status: None   Collection Time: 07/08/19  1:30 AM   Specimen: Nasopharyngeal Swab  Result Value Ref Range   SARS Coronavirus 2 NEGATIVE NEGATIVE    Comment: (NOTE) SARS-CoV-2 target nucleic acids are NOT DETECTED. The SARS-CoV-2 RNA is generally detectable in upper and lower respiratory specimens during the acute phase of infection. The lowest concentration of SARS-CoV-2 viral copies this assay can detect is 250 copies / mL. A negative result does not preclude SARS-CoV-2 infection and should not be used as the sole basis for treatment or other patient management decisions.  A negative result may occur with improper specimen collection / handling, submission of specimen other than nasopharyngeal swab, presence of viral mutation(s) within the areas targeted by this assay, and inadequate number of viral copies (<250 copies / mL). A negative result must be combined with clinical observations, patient  history, and epidemiological  information. Fact Sheet for Patients:   BoilerBrush.com.cyhttps://www.fda.gov/media/136312/download Fact Sheet for Healthcare Providers: https://pope.com/https://www.fda.gov/media/136313/download This test is not yet approved or cleared  by the [place] FDA and has been authorized for detection and/or diagnosis of SARS-CoV-2 by FDA under an Emergency Use Authorization (EUA).  This EUA will remain in effect (meaning this test can be used) for the duration of the COVID-19 declaration under Section 564(b)(1) of the Act, 21 U.S.C. section 360bbb-3(b)(1), unless the authorization is terminated or revoked sooner. Performed at [facility], [address] [place], [place] [address]   Urine rapid drug screen (hosp performed)     Status: Abnormal   Collection Time: 07/08/19  1:34 AM  Result Value Ref Range   Opiates NONE DETECTED NONE DETECTED   Cocaine NONE DETECTED NONE DETECTED   Benzodiazepines NONE DETECTED NONE DETECTED   Amphetamines NONE DETECTED NONE DETECTED   Tetrahydrocannabinol POSITIVE (A) NONE DETECTED   Barbiturates NONE DETECTED NONE DETECTED    Comment: (NOTE) DRUG SCREEN FOR MEDICAL PURPOSES ONLY.  IF CONFIRMATION IS NEEDED FOR ANY PURPOSE, NOTIFY LAB WITHIN 5 DAYS. LOWEST DETECTABLE LIMITS FOR URINE DRUG SCREEN Drug Class                     Cutoff (ng/mL) Amphetamine and metabolites    1000 Barbiturate and metabolites    200 Benzodiazepine                 200 Tricyclics and metabolites     300 Opiates and metabolites        300 Cocaine and metabolites        300 THC                            50 Performed at [facility], [address] [place], [place] [address]   Urinalysis, Routine w reflex microscopic     Status: None   Collection Time: 07/08/19  1:34 AM  Result Value Ref Range   Color, Urine YELLOW YELLOW   APPearance CLEAR CLEAR   Specific Gravity, Urine 1.008 1.005 - 1.030   pH 6.0 5.0 - 8.0   Glucose, UA  NEGATIVE NEGATIVE mg/dL   Hgb urine dipstick NEGATIVE NEGATIVE   Bilirubin Urine NEGATIVE NEGATIVE   Ketones, ur NEGATIVE NEGATIVE mg/dL   Protein, ur NEGATIVE NEGATIVE mg/dL   Nitrite NEGATIVE NEGATIVE   Leukocytes,Ua NEGATIVE NEGATIVE    Comment: Performed at [facility], [address] [place], [place] [address]    Blood Alcohol level:  Lab Results  Component Value Date   ETH <10 07/07/2019    Metabolic Disorder Labs:  No results found for: HGBA1C, MPG No results found for: PROLACTIN No results found for: CHOL, TRIG, HDL, CHOLHDL, VLDL, LDLCALC  Current Medications: Current Facility-Administered Medications  Medication Dose Route Frequency Provider Last Rate Last Admin   acetaminophen (TYLENOL) tablet 650 mg  650 mg Oral Q6H PRN Antonieta Pertlary, Tanveer Dobberstein Lawson, MD       alum & mag hydroxide-simeth (MAALOX/MYLANTA) 200-200-20 MG/5ML suspension 30 mL  30 mL Oral Q4H PRN Antonieta Pertlary, Vinod Mikesell Lawson, MD       folic acid (FOLVITE) tablet 1 mg  1 mg Oral Daily Antonieta Pertlary, Shadawn Hanaway Lawson, MD   1 mg at 07/09/19 0900   hydrOXYzine (ATARAX/VISTARIL) tablet 25 mg  25 mg Oral TID PRN Antonieta Pertlary, Calem Cocozza Lawson, MD       OLANZapine zydis [facility](ZYPREXA) disintegrating  tablet 10 mg  10 mg Oral Q8H PRN Antonieta Pert, MD       And   LORazepam (ATIVAN) tablet 1 mg  1 mg Oral PRN Antonieta Pert, MD       And   ziprasidone (GEODON) injection 20 mg  20 mg Intramuscular PRN Antonieta Pert, MD       LORazepam (ATIVAN) tablet 1 mg  1 mg Oral Q6H PRN Antonieta Pert, MD       magnesium hydroxide (MILK OF MAGNESIA) suspension 30 mL  30 mL Oral Daily PRN Antonieta Pert, MD       ondansetron [facility]) tablet 8 mg  8 mg Oral Q8H PRN Antonieta Pert, MD       QUEtiapine (SEROQUEL) tablet 100 mg  100 mg Oral QHS Antonieta Pert, MD       QUEtiapine (SEROQUEL) tablet 50 mg  50 mg Oral Daily Antonieta Pert, MD       sertraline (ZOLOFT) tablet 25 mg  25 mg Oral Daily Antonieta Pert, MD        traZODone (DESYREL) tablet 50 mg  50 mg Oral QHS PRN Antonieta Pert, MD   50 mg at 07/08/19 2108   PTA Medications: No medications prior to admission.    Musculoskeletal: Strength & Muscle Tone: within normal limits Gait & Station: normal Patient leans: N/A  Psychiatric Specialty Exam: Physical Exam  Nursing note and vitals reviewed. Constitutional: He appears well-developed and well-nourished.  HENT:  Head: Normocephalic and atraumatic.  Respiratory: Effort normal.  Neurological: He is alert.    Review of Systems  Blood pressure (!) 145/76, pulse 96, temperature 97.9 F (36.6 C), temperature source Oral, resp. rate 18, height 5\' 3"  (1.6 m), weight 44.5 kg, SpO2 100 %.Body mass index is 17.36 kg/m.  General Appearance: Disheveled  Eye Contact:  Minimal  Speech:  Normal Rate  Volume:  Decreased  Mood:  Anxious, Depressed and Dysphoric  Affect:  Flat  Thought Process:  Goal Directed and Descriptions of Associations: Circumstantial  Orientation:  Negative  Thought Content:  Hallucinations: Auditory  Suicidal Thoughts:  Yes.  with intent/plan  Homicidal Thoughts:  No  Memory:  Immediate;   Poor Recent;   Poor Remote;   Poor  Judgement:  Impaired  Insight:  Lacking  Psychomotor Activity:  Psychomotor Retardation  Concentration:  Concentration: Fair and Attention Span: Fair  Recall:  of Knowledge:  Fair  Language:  Fair  Akathisia:  Negative  Handed:  Right  AIMS (if indicated):     Assets:  Desire for Improvement Housing Resilience  ADL's:  Intact  Cognition:  Impaired,  Mild  Sleep:  Number of Hours: 8.5    Treatment Plan Summary: Daily contact with patient to assess and evaluate symptoms and progress in treatment, Medication management and Plan : Patient is seen and examined.  Patient is a 24 year old male with the above-stated past psychiatric history was admitted secondary to auditory hallucinations as well as suicidal ideation.  He will  be admitted to the hospital.  He will be integrated into the milieu.  Will be encouraged to attend groups.  He has been started on olanzapine 5 mg p.o. nightly, but he does not appear to have been of benefit at this point.  I suspect that he has a learning disability, and could potentially be autistic as well.  Given that I would have started Risperdal, but he had a seizure in  2015.  Given that we will start him on Seroquel 50 mg p.o. daily and 100 mg p.o. nightly and titrate that during the course of the hospitalization.  Additionally given the suspected depression we will start Zoloft 25 mg p.o. daily as well.  He does have sickle cell trait.  A CT scan of the head on admission was negative.  Given his seizure history we will place him on seizure precautions.  I suspect his mother is probably his guardian, and I will have social work attempt to contact her so that we have permission for treatment of these medications.  Review of his admission laboratories revealed a mildly low potassium at 3.4.  His blood sugar was elevated at 133.  Liver function enzymes were normal.  His CBC was essentially normal except for a mildly decreased MCV.  Folic acid and thiamine will hopefully correct that.  His acetaminophen was 13, salicylate was less than 7.  Urinalysis was essentially negative.  Blood alcohol was less than 10.  Drug screen was positive for marijuana.  CT of the head was negative.  EKG showed a normal sinus rhythm with a QTC of 388.  Observation Level/Precautions:  15 minute checks  Laboratory:  Chemistry Profile  Psychotherapy:    Medications:    Consultations:    Discharge Concerns:    Estimated LOS:  Other:     Physician Treatment Plan for Primary Diagnosis: <principal problem not specified> Long Term Goal(s): Improvement in symptoms so as ready for discharge  Short Term Goals: Ability to identify changes in lifestyle to reduce recurrence of condition will improve, Ability to verbalize feelings  will improve, Ability to disclose and discuss suicidal ideas, Ability to demonstrate self-control will improve, Ability to identify and develop effective coping behaviors will improve, Ability to maintain clinical measurements within normal limits will improve and Ability to identify triggers associated with substance abuse/mental health issues will improve  Physician Treatment Plan for Secondary Diagnosis: Active Problems:   Suicidal ideations  Long Term Goal(s): Improvement in symptoms so as ready for discharge  Short Term Goals: Ability to identify changes in lifestyle to reduce recurrence of condition will improve, Ability to verbalize feelings will improve, Ability to disclose and discuss suicidal ideas, Ability to demonstrate self-control will improve, Ability to identify and develop effective coping behaviors will improve, Ability to maintain clinical measurements within normal limits will improve and Ability to identify triggers associated with substance abuse/mental health issues will improve  I certify that inpatient services furnished can reasonably be expected to improve the patient's condition.    Sharma Covert, MD 6/5/20212:09 PM

## 2019-07-09 NOTE — Progress Notes (Signed)
Patient is isolative to his room but pleasant. He reports having had a good day and voiced no complaints or concerns. He was informed of his medications for tonight. Support given and safety maintained with 15 min checks.

## 2019-07-09 NOTE — [facility] Suicide Risk Assessment (Signed)
[facility] INPATIENT:  Family/Significant Other Suicide Prevention Education  Suicide Prevention Education: Education Completed; Mother, Dustin Tate [phone]), has been identified by the patient as the family member/significant other with whom the patient will be residing, and identified as the person(s) who will aid the patient in the event of a mental health crisis (suicidal ideations/suicide attempt).  With written consent from the patient, the family member/significant other has been provided the following suicide prevention education, prior to the and/or following the discharge of the patient.  The suicide prevention education provided includes the following:  Suicide risk factors  Suicide prevention and interventions  National Suicide Hotline telephone number  [facility] assessment telephone number  [facility] Emergency Assistance 911  [facility] and/or Residential Mobile Crisis Unit telephone number   Request made of family/significant other to:  Remove weapons (e.g., guns, rifles, knives), all items previously/currently identified as safety concern.    Remove drugs/medications (over-the-counter, prescriptions, illicit drugs), all items previously/currently identified as a safety concern.   The family member/significant other verbalizes understanding of the suicide prevention education information provided.  The family member/significant other agrees to remove the items of safety concern listed above.  Per patients mother, Ms. Loeza, patient is his own guardian, however she helps to care for him. Patients mother stated that his learning disability is mostly around writing and he has some trouble with comprehension, but is normally able to voice when he does not understand something. Patients mother reports that she has no safety concerns for this patient returning home when he is ready to be discharged.   Ruthann Cancer MSW, [employer] Social  Worker  [facility]

## 2019-07-09 NOTE — [facility] Counselor (Signed)
Adult Comprehensive Assessment  Patient ID: Dustin Tate, male   DOB: Mar 29, 1995, 24 y.o.   MRN: [id number]  Information Source: Information source: Patient  Current Stressors:  Patient states their primary concerns and needs for treatment are:: "I don't remember" Patient states their goals for this hospitilization and ongoing recovery are:: "To get out" Educational / Learning stressors: Denies Employment / Job issues: Denies Family Relationships: Denies Museum/gallery curator / Lack of resources (include bankruptcy): Denies Housing / Lack of housing: Denies Physical health (include injuries & life threatening diseases): Denies Social relationships: Deneis Substance abuse: Denies Bereavement / Loss: Denies  Living/Environment/Situation:  Living Arrangements: Parent, Other relatives Living conditions (as described by patient or guardian): "alright" Who else lives in the home?: Mother, 2 younger brothers How long has patient lived in current situation?: 1-2 years What is atmosphere in current home: Supportive  Family History:  Marital status: Single Are you sexually active?: No What is your sexual orientation?: Heterosexual Has your sexual activity been affected by drugs, alcohol, medication, or emotional stress?: No Does patient have children?: No  Childhood History:  By whom was/is the patient raised?: Mother Additional childhood history information: "Rough" Description of patient's relationship with caregiver when they were a child: "Tight" Patient's description of current relationship with people who raised him/her: "The same" How were you disciplined when you got in trouble as a child/adolescent?: "whoopings" Does patient have siblings?: Yes Number of Siblings: 2(Younger brothers) Description of patient's current relationship with siblings: "Good" Did patient suffer any verbal/emotional/physical/sexual abuse as a child?: No Did patient suffer from severe childhood neglect?:  No Has patient ever been sexually abused/assaulted/raped as an adolescent or adult?: No Was the patient ever a victim of a crime or a disaster?: No Witnessed domestic violence?: No Has patient been affected by domestic violence as an adult?: No  Education:  Highest grade of school patient has completed: 12th grade Currently a student?: No Learning disability?: Yes What learning problems does patient have?: Unkown  Employment/Work Situation:   Employment situation: Employed Where is patient currently employed?: "[employer]" How long has patient been employed?: Unsure Patient's job has been impacted by current illness: No What is the longest time patient has a held a job?: Almost 1 year Where was the patient employed at that time?: [employer] Has patient ever been in the [employer]?: No  Financial Resources:   Museum/gallery curator resources: Income from employment Does patient have a representative payee or guardian?: No  Alcohol/Substance Abuse:   What has been your use of drugs/alcohol within the last 12 months?: Reports smoking marijuana daily with last use on Thursday of this week If attempted suicide, did drugs/alcohol play a role in this?: No Alcohol/Substance Abuse Treatment Hx: Denies past history Has alcohol/substance abuse ever caused legal problems?: No  Social Support System:   Pensions consultant Support System: Fair Dietitian Support System: "mom" Type of faith/religion: None How does patient's faith help to cope with current illness?: N/A  Leisure/Recreation:   Do You Have Hobbies?: Yes Leisure and Hobbies: "Play video games, sleep"  Strengths/Needs:   What is the patient's perception of their strengths?: "Running" Patient states they can use these personal strengths during their treatment to contribute to their recovery: "I don't know" Patient states these barriers may affect/interfere with their treatment: Denies Patient states these barriers may affect their return to  the community: Denies Other important information patient would like considered in planning for their treatment: None  Discharge Plan:   Currently receiving community mental health  services: Yes (From Whom)(Unsure of with whom) Patient states concerns and preferences for aftercare planning are: Reports he does not want services. Patient states they will know when they are safe and ready for discharge when: No Does patient have access to transportation?: No Does patient have financial barriers related to discharge medications?: No Patient description of barriers related to discharge medications: None. Has insurance. Plan for no access to transportation at discharge: CSW will assess. Will patient be returning to same living situation after discharge?: Yes  Summary/Recommendations:   Summary and Recommendations (to be completed by the evaluator): 24 y.o. male that presents this date with S/I. Patient renders limited history due to current altered mental state. Patient does not seem to process the content of some of this writers questions. This Clinical research associate utilized chart information to obtain a partial history. Patient on arrival had reported he attempted self harm by putting a rope around his neck yesterday. When asked this date if that information was correct patient nodded "yes." Patient speaks in a low soft voice that is difficult to understand this date. Patient's affect is flat and patient is not orientated. Patient denies any H/I although reports ongoing AVH. As noted above patient answers "yes" and "no" to most questions and will not elaborate on content of AVH. Information to complete assessment was obtained from admission notes. While here, Clayton Lefort can benefit from crisis stabilization, medication management, therapeutic milieu, and referrals for services  Corena Tilson A Egan Sahlin. 07/09/2019

## 2019-07-09 NOTE — Progress Notes (Signed)
   07/09/19 1300  Psych Admission Type (Psych Patients Only)  Admission Status Voluntary  Psychosocial Assessment  Patient Complaints Isolation  Eye Contact Brief  Facial Expression Sad  Affect Appropriate to circumstance  Speech Soft;Slow  Interaction Assertive  Motor Activity Slow  Appearance/Hygiene In scrubs  Behavior Characteristics Cooperative  Mood Depressed  Thought Process  Coherency WDL  Content WDL  Delusions WDL  Perception Hallucinations  Hallucination Auditory  Judgment Impaired  Confusion Mild  Danger to Self  Current suicidal ideation? Passive  Self-Injurious Behavior Some self-injurious ideation observed or expressed.  No lethal plan expressed   Agreement Not to Harm Self Yes  Description of Agreement verbal contract for safety  Danger to Others  Danger to Others None reported or observed

## 2019-07-09 NOTE — Progress Notes (Signed)
   07/09/19 1945  COVID-19 Daily Checkoff  Have you had a fever (temp > 37.80C/100F)  in the past 24 hours?  No  If you have had runny nose, nasal congestion, sneezing in the past 24 hours, has it worsened? No  COVID-19 EXPOSURE  Have you traveled outside the state in the past 14 days? No  Have you been in contact with someone with a confirmed diagnosis of COVID-19 or PUI in the past 14 days without wearing appropriate PPE? No  Have you been living in the same home as a person with confirmed diagnosis of COVID-19 or a PUI (household contact)? No  Have you been diagnosed with COVID-19? No

## 2019-07-10 DIAGNOSIS — F29 Unspecified psychosis not due to a substance or known physiological condition: Secondary | ICD-10-CM | POA: Diagnosis not present

## 2019-07-10 DIAGNOSIS — F12222 Cannabis dependence with intoxication with perceptual disturbance: Secondary | ICD-10-CM | POA: Diagnosis not present

## 2019-07-10 DIAGNOSIS — R45851 Suicidal ideations: Secondary | ICD-10-CM | POA: Diagnosis not present

## 2019-07-10 LAB — TSH: TSH: 1.009 u[IU]/mL (ref 0.350–4.500)

## 2019-07-10 MED ORDER — SERTRALINE HCL 50 MG PO TABS
50.0000 mg | ORAL_TABLET | Freq: Every day | ORAL | Status: DC
  Administered 2019-07-11 – 2019-07-17 (×7): 50 mg via ORAL
  Filled 2019-07-10 (×9): qty 1

## 2019-07-10 MED ORDER — SERTRALINE HCL 25 MG PO TABS
25.0000 mg | ORAL_TABLET | Freq: Once | ORAL | Status: AC
  Administered 2019-07-10: 25 mg via ORAL
  Filled 2019-07-10: qty 1

## 2019-07-10 MED ORDER — QUETIAPINE FUMARATE 200 MG PO TABS
200.0000 mg | ORAL_TABLET | Freq: Every day | ORAL | Status: DC
  Administered 2019-07-10: 200 mg via ORAL
  Filled 2019-07-10 (×3): qty 1

## 2019-07-10 NOTE — Progress Notes (Signed)
Adult Psychoeducational Group Note  Date:  07/10/2019 Time:  11:25 PM  Group Topic/Focus:  Wrap-Up Group:   The focus of this group is to help patients review their daily goal of treatment and discuss progress on daily workbooks.  Participation Level:  Did Not Attend  Participation Quality:  Did Not Attend  Affect:  Did Not Attend  Cognitive:  Did Not Attend  Insight: None  Engagement in Group:  Did Not Attend  Modes of Intervention:  Did Not Attend  Additional Comments:  Pt did not attend evening wrap up group tonight.  Felipa Furnace 07/10/2019, 11:25 PM

## 2019-07-10 NOTE — Progress Notes (Signed)
   07/10/19 2150  COVID-19 Daily Checkoff  Have you had a fever (temp > 37.80C/100F)  in the past 24 hours?  No  If you have had runny nose, nasal congestion, sneezing in the past 24 hours, has it worsened? No  COVID-19 EXPOSURE  Have you traveled outside the state in the past 14 days? No  Have you been in contact with someone with a confirmed diagnosis of COVID-19 or PUI in the past 14 days without wearing appropriate PPE? No  Have you been living in the same home as a person with confirmed diagnosis of COVID-19 or a PUI (household contact)? No  Have you been diagnosed with COVID-19? No

## 2019-07-10 NOTE — Progress Notes (Signed)
[facility] MD Progress Note  07/10/2019 11:10 AM Dustin Tate  MRN:  [id number] Subjective: Patient is a 24 year old male who presented to the [facility] emergency department on 07/07/2019 with new onset auditory hallucinations and suicidal ideation.  He had actually put a rope around his neck and attempt to hang himself the day prior to admission.  Objective: Patient is seen and examined.  Patient is a 24 year old male with the above-stated past psychiatric history seen in follow-up.  He remains significantly depressed.  His speech is very low, he has very little eye contact.  When we attempted to talk about his depression he was more apathetic about that.  "It is all over anyway".  I attempted to find out if there were any new stressors, and he stated no.  I asked him if anything it happened on his trip to [place] and he also denied that.  He was very unclear on his marijuana use.  He denied auditory hallucinations today, but was very vague about that as well.  Clearly he does not provide a great history.  His blood pressure was initially normal at 129/79, but repeat reading was 145/96.  Pulse was 93.  He is afebrile.  Left 5.75 hours last night.  Review of his admission laboratories showed no new laboratories.  On examination today it also appeared as though his left thigh was projecting out further, and was not symmetrical with his right.  Review of the CT scan of the head revealed no evidence of any mass or anything that would be pushing the eye out.  He stated that his eyes had been irregularly placed his entire life.  Principal Problem: <principal problem not specified> Diagnosis: Active Problems:   Suicidal ideations  Total Time spent with patient: 20 minutes  Past Psychiatric History: H&P  Past Medical History:  Past Medical History:  Diagnosis Date  . Depression   . 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 Sickle cell trait ([place])    History reviewed. No pertinent surgical  history. Family History:  Family History  Problem Relation Age of Onset  . Healthy Mother   . Healthy Father    Family Psychiatric  History: See admission H&P Social History:  Social History   Substance and Sexual Activity  Alcohol Use No     Social History   Substance and Sexual Activity  Drug Use Yes  . Frequency: 2.0 times per week  . Types: Marijuana   Comment: Advised to quit    Social History   Socioeconomic History  . Marital status: Single    Spouse name: Not on file  . Number of children: Not on file  . Years of education: Not on file  . Highest education level: Not on file  Occupational History  . 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 Smoking status: Current Some Day Smoker    Packs/day: 0.10    Years: 2.00    Pack years: 0.20    Types: Cigarettes    Start date: 2016  . Smokeless tobacco: Never Used  Substance and Sexual Activity  . Alcohol use: No  . Drug use: Yes    Frequency: 2.0 times per week    Types: Marijuana    Comment: Advised to quit  . Sexual activity: Not Currently    Partners: Female    Birth control/protection: Condom  Other Topics Concern  . 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 Difficulty of Paying Living Expenses:   Food Insecurity:   . Worried About Programme researcher, broadcasting/film/video in the Last Year:   . Barista in the Last Year:   Transportation Needs:   . Freight forwarder (Medical):   Marland Kitchen Lack of Transportation (Non-Medical):   Physical Activity:   . Days of Exercise per Week:   . Minutes of Exercise per Session:   Stress:   . Feeling of Stress :   Social Connections:   . Frequency of Communication with Friends and Family:   . Frequency of Social Gatherings with Friends and Family:   . Attends Religious Services:   . Active Member of Clubs or Organizations:    . Attends Banker Meetings:   Marland Kitchen Marital Status:    Additional Social History:                         Sleep: Fair  Appetite:  Fair  Current Medications: Current Facility-Administered Medications  Medication Dose Route Frequency Provider Last Rate Last Admin  . acetaminophen (TYLENOL) tablet 650 mg  650 mg Oral Q6H PRN Antonieta Pert, MD      . alum & mag hydroxide-simeth (MAALOX/MYLANTA) 200-200-20 MG/5ML suspension 30 mL  30 mL Oral Q4H PRN Antonieta Pert, MD      . folic acid (FOLVITE) tablet 1 mg  1 mg Oral Daily Antonieta Pert, MD   1 mg at 07/10/19 7209  . hydrOXYzine (ATARAX/VISTARIL) tablet 25 mg  25 mg Oral TID PRN Antonieta Pert, MD      . OLANZapine zydis (ZYPREXA) disintegrating tablet 10 mg  10 mg Oral Q8H PRN Antonieta Pert, MD       And  . LORazepam (ATIVAN) tablet 1 mg  1 mg Oral PRN Antonieta Pert, MD       And  . ziprasidone (GEODON) injection 20 mg  20 mg Intramuscular PRN Antonieta Pert, MD      . LORazepam (ATIVAN) tablet 1 mg  1 mg Oral Q6H PRN Antonieta Pert, MD      . magnesium hydroxide (MILK OF MAGNESIA) suspension 30 mL  30 mL Oral Daily PRN Antonieta Pert, MD      . ondansetron [facility]) tablet 8 mg  8 mg Oral Q8H PRN Antonieta Pert, MD      . QUEtiapine (SEROQUEL) tablet 200 mg  200 mg Oral QHS Antonieta Pert, MD      . QUEtiapine (SEROQUEL) tablet 50 mg  50 mg Oral Daily Antonieta Pert, MD   50 mg at 07/10/19 0751  . sertraline (ZOLOFT) tablet 25 mg  25 mg Oral Once Antonieta Pert, MD      . Melene Muller ON 07/11/2019] sertraline (ZOLOFT) tablet 50 mg  50 mg Oral Daily Antonieta Pert, MD      . traZODone (DESYREL) tablet 50 mg  50 mg Oral QHS PRN Antonieta Pert, MD   50 mg at 07/09/19 2125    Lab Results: No results found for this or any previous visit (from the past 48 hour(s)).  Blood Alcohol level:  Lab Results  Component Value Date   ETH <10 07/07/2019    Metabolic  Disorder Labs: No results found for: HGBA1C, MPG No results found for:  PROLACTIN No results found for: CHOL, TRIG, HDL, CHOLHDL, VLDL, LDLCALC  Physical Findings: AIMS:  , ,  ,  ,    CIWA:    COWS:     Musculoskeletal: Strength & Muscle Tone: within normal limits Gait & Station: normal Patient leans: N/A  Psychiatric Specialty Exam: Physical Exam  Nursing note and vitals reviewed. Constitutional: He appears well-developed and well-nourished.  HENT:  Head: Normocephalic and atraumatic.  Respiratory: Effort normal.  Neurological: He is alert.    Review of Systems  Blood pressure (!) 145/96, pulse 93, temperature 98.3 F (36.8 C), temperature source Oral, resp. rate 18, height 5\' 3"  (1.6 m), weight 44.5 kg, SpO2 100 %.Body mass index is 17.36 kg/m.  General Appearance: Disheveled  Eye Contact:  Minimal  Speech:  Normal Rate  Volume:  Decreased  Mood:  Depressed and Dysphoric  Affect:  Depressed  Thought Process:  Goal Directed and Descriptions of Associations: Circumstantial  Orientation:  Full (Time, Place, and Person)  Thought Content:  Hallucinations: Auditory and Rumination  Suicidal Thoughts:  Yes.  with intent/plan  Homicidal Thoughts:  No  Memory:  Immediate;   Poor Recent;   Poor Remote;   Poor  Judgement:  Impaired  Insight:  Lacking  Psychomotor Activity:  Psychomotor Retardation  Concentration:  Concentration: Fair and Attention Span: Fair  Recall:  of Knowledge:  Fair  Language:  Fair  Akathisia:  Negative  Handed:  Right  AIMS (if indicated):     Assets:  Desire for Improvement Resilience  ADL's:  Intact  Cognition:  WNL  Sleep:  Number of Hours: 5.75     Treatment Plan Summary: Daily contact with patient to assess and evaluate symptoms and progress in treatment, Medication management and Plan : Patient is seen and examined.  Patient is a 24 year old male with the above-stated past psychiatric history who is seen in follow-up.    Diagnosis: 1.  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 3.  Learning disability 4.  Cannabis use disorder 5.  Mildly elevated blood pressure  Pertinent findings on examination today: 1.  Patient remains severely depressed. 2.  Despite his denial I believe he may still be having auditory hallucinations that were derogatory in nature.  Plan: 1.  Increase Seroquel to 200 mg p.o. nightly and continue 50 mg p.o. daily for psychosis and mood stability. 2.  Increase sertraline to 50 mg p.o. daily for depression. 3.  Reorder TSH. 4.  Continue folic acid 1 mg p.o. daily for nutritional supplementation. 5.  Continue hydroxyzine 25 mg p.o. 3 times daily as needed anxiety. 6.  Continue lorazepam 1 mg p.o. every 6 hours as needed for a CIWA greater than greater than 10. 11.  Continue trazodone 50 mg p.o. nightly as needed insomnia. 12.  Disposition planning-in progress.  22, MD 07/10/2019, 11:10 AM

## 2019-07-10 NOTE — [facility] Group Notes (Signed)
[facility] Group Notes: (Clinical Social Work)   07/10/2019      Type of Therapy:  Group Therapy   Participation Level:  Did Not Attend - was invited individually by Nurse/MHT and chose not to attend.   Leisa Lenz, LCSWA 07/10/2019  1:06 PM

## 2019-07-10 NOTE — Plan of Care (Signed)
Progress note  D: pt found in bed; compliant with medication administration. Pt continues to be guarded and minimal but is pleasant. Pt also presents depressed/sullen/sad but denies these. Pt seems less preoccupied with voices and denies avh at this time. Pt denies si/hi/ah/vh and verbally agrees to approach staff if these become apparent or before harming themself/others while at [facility].  A: Pt provided support and encouragement. Pt given medication per protocol and standing orders. Q43m safety checks implemented and continued.  R: Pt safe on the unit. Will continue to monitor.  Pt progressing in the following metrics  Problem: Education: Goal: Knowledge of [facility] Education information/materials will improve Outcome: Progressing Goal: Mental status will improve Outcome: Progressing Goal: Verbalization of understanding the information provided will improve Outcome: Progressing   Problem: Activity: Goal: Sleeping patterns will improve Outcome: Progressing

## 2019-07-10 NOTE — Progress Notes (Signed)
   07/10/19 2050  Psych Admission Type (Psych Patients Only)  Admission Status Voluntary  Psychosocial Assessment  Patient Complaints None  Eye Contact Brief  Facial Expression Sullen;Sad  Affect Depressed;Sad;Sullen  Speech Logical/coherent;Soft;Slow  Interaction Cautious;Guarded;Isolative  Motor Activity Slow  Appearance/Hygiene In scrubs  Behavior Characteristics Cooperative;Appropriate to situation  Mood Depressed;Anxious  Thought Process  Coherency Blocking  Content Paranoia  Delusions Paranoid  Perception Hallucinations  Hallucination Auditory  Judgment Impaired  Confusion None  Danger to Self  Current suicidal ideation? Denies  Self-Injurious Behavior Some self-injurious ideation observed or expressed.  No lethal plan expressed   Agreement Not to Harm Self Yes  Description of Agreement verbal contract for safety  Danger to Others  Danger to Others None reported or observed

## 2019-07-11 DIAGNOSIS — F12222 Cannabis dependence with intoxication with perceptual disturbance: Secondary | ICD-10-CM | POA: Diagnosis not present

## 2019-07-11 DIAGNOSIS — R45851 Suicidal ideations: Secondary | ICD-10-CM | POA: Diagnosis not present

## 2019-07-11 DIAGNOSIS — F29 Unspecified psychosis not due to a substance or known physiological condition: Secondary | ICD-10-CM | POA: Diagnosis not present

## 2019-07-11 MED ORDER — ENSURE ENLIVE PO LIQD
237.0000 mL | Freq: Two times a day (BID) | ORAL | Status: DC
  Administered 2019-07-12 – 2019-07-17 (×11): 237 mL via ORAL

## 2019-07-11 MED ORDER — ADULT MULTIVITAMIN W/MINERALS CH
1.0000 | ORAL_TABLET | Freq: Every day | ORAL | Status: DC
  Administered 2019-07-11 – 2019-07-17 (×7): 1 via ORAL
  Filled 2019-07-11 (×9): qty 1

## 2019-07-11 NOTE — Progress Notes (Signed)
[facility] MD Progress Note  07/11/2019 1:20 PM HOLTEN SPANO  MRN:  [id number] Subjective:  Patient is a 24 year old male who presented to the [facility] emergency department on 07/07/2019 with new onset auditory hallucinations and suicidal ideation.  He had actually put a rope around his neck and attempt to hang himself the day prior to admission.  Objective: Patient is seen and examined.  Patient is a 24 year old male with the above-stated past psychiatric history who is seen in follow-up.  He looks better today.  He is able to make good eye contact.  He denied suicidal ideation today.  I asked him what the difference was between yesterday and today, and he stated he was "feeling better".  I did ask about whether there were any other drugs involved besides the marijuana.  He denied that.  He also continues to deny that there was some specific stressor that led to his suicidal ideation.  He stated that since admission he feels about 60% better than he did when he was admitted.  He denied any auditory hallucinations today.  He denied any suicidal ideation.  His vital signs are stable, he is afebrile.  He slept 6 hours last night.  His TSH came back normal at 1.009.  Principal Problem: <principal problem not specified> Diagnosis: Active Problems:   Suicidal ideations  Total Time spent with patient: 15 minutes  Past Psychiatric History: See admission H&P  Past Medical History:  Past Medical History:  Diagnosis Date  . Depression   . 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 Sickle cell trait (HCC)    History reviewed. No pertinent surgical history. Family History:  Family History  Problem Relation Age of Onset  . Healthy Mother   . Healthy Father    Family Psychiatric  History: See admission H&P Social History:  Social History   Substance and Sexual Activity  Alcohol Use No     Social History   Substance and Sexual Activity  Drug Use Yes  . Frequency: 2.0 times per week  . Types:  Marijuana   Comment: Advised to quit    Social History   Socioeconomic History  . Marital status: Single    Spouse name: Not on file  . Number of children: Not on file  . Years of education: Not on file  . Highest education level: Not on file  Occupational History  . 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 Smoking status: Current Some Day Smoker    Packs/day: 0.10    Years: 2.00    Pack years: 0.20    Types: Cigarettes    Start date: 2016  . Smokeless tobacco: Never Used  Substance and Sexual Activity  . Alcohol use: No  . Drug use: Yes    Frequency: 2.0 times per week    Types: Marijuana    Comment: Advised to quit  . Sexual activity: Not Currently    Partners: Female    Birth control/protection: Condom  Other Topics Concern  . 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 Difficulty of Paying Living Expenses:   Food Insecurity:   . Worried About Programme researcher, broadcasting/film/video in the Last Year:   . [employer] of Food in the Last Year:   Transportation Needs:   . Lack of  Transportation (Medical):   Marland Kitchen Lack of Transportation (Non-Medical):   Physical Activity:   . Days of Exercise per Week:   . Minutes of Exercise per Session:   Stress:   . Feeling of Stress :   Social Connections:   . Frequency of Communication with Friends and Family:   . Frequency of Social Gatherings with Friends and Family:   . Attends Religious Services:   . Active Member of Clubs or Organizations:   . Attends Banker Meetings:   Marland Kitchen Marital Status:    Additional Social History:                         Sleep: Good  Appetite:  Good  Current Medications: Current Facility-Administered Medications  Medication Dose Route Frequency Provider Last Rate Last Admin  . acetaminophen (TYLENOL) tablet 650 mg  650 mg Oral Q6H PRN Antonieta Pert, MD      . alum & mag hydroxide-simeth (MAALOX/MYLANTA) 200-200-20 MG/5ML suspension 30 mL  30 mL Oral Q4H PRN Antonieta Pert, MD      . feeding supplement (ENSURE ENLIVE) (ENSURE ENLIVE) liquid 237 mL  237 mL Oral BID BM Antonieta Pert, MD      . folic acid (FOLVITE) tablet 1 mg  1 mg Oral Daily Antonieta Pert, MD   1 mg at 07/11/19 8127  . hydrOXYzine (ATARAX/VISTARIL) tablet 25 mg  25 mg Oral TID PRN Antonieta Pert, MD      . OLANZapine zydis (ZYPREXA) disintegrating tablet 10 mg  10 mg Oral Q8H PRN Antonieta Pert, MD       And  . LORazepam (ATIVAN) tablet 1 mg  1 mg Oral PRN Antonieta Pert, MD       And  . ziprasidone (GEODON) injection 20 mg  20 mg Intramuscular PRN Antonieta Pert, MD      . LORazepam (ATIVAN) tablet 1 mg  1 mg Oral Q6H PRN Antonieta Pert, MD      . magnesium hydroxide (MILK OF MAGNESIA) suspension 30 mL  30 mL Oral Daily PRN Antonieta Pert, MD      . multivitamin with minerals tablet 1 tablet  1 tablet Oral Daily Antonieta Pert, MD      . ondansetron [facility]) tablet 8 mg  8 mg Oral Q8H PRN Antonieta Pert, MD      . QUEtiapine (SEROQUEL) tablet 200 mg  200 mg Oral QHS Antonieta Pert, MD   200 mg at 07/10/19 2048  . QUEtiapine (SEROQUEL) tablet 50 mg  50 mg Oral Daily Antonieta Pert, MD   50 mg at 07/11/19 5170  . sertraline (ZOLOFT) tablet 50 mg  50 mg Oral Daily Antonieta Pert, MD   50 mg at 07/11/19 0174  . traZODone (DESYREL) tablet 50 mg  50 mg Oral QHS PRN Antonieta Pert, MD   50 mg at 07/10/19 2049    Lab Results:  Results for orders placed or performed during the hospital encounter of 07/08/19 (from the past 48 hour(s))  TSH     Status: None   Collection Time: 07/10/19  6:05 PM  Result Value Ref Range   TSH 1.009 0.350 - 4.500 uIU/mL    Comment: Performed by a 3rd Generation assay with a functional sensitivity of <=0.01 uIU/mL. Performed at [facility], [address], [place], [place] [address]     Blood Alcohol  level:  Lab Results  Component Value Date   ETH <10 07/07/2019    Metabolic Disorder Labs: No results found for: HGBA1C, MPG No results found for: PROLACTIN No results found for: CHOL, TRIG, HDL, CHOLHDL, VLDL, LDLCALC  Physical Findings: AIMS:  , ,  ,  ,    CIWA:    COWS:     Musculoskeletal: Strength & Muscle Tone: within normal limits Gait & Station: normal Patient leans: N/A  Psychiatric Specialty Exam: Physical Exam  Nursing note and vitals reviewed. Constitutional: He is oriented to person, place, and time. He appears well-developed and well-nourished.  HENT:  Head: Normocephalic and atraumatic.  Respiratory: Effort normal.  Neurological: He is alert and oriented to person, place, and time.    Review of Systems  Blood pressure 122/80, pulse (!) 119, temperature 98.8 F (37.1 C), temperature source Oral, resp. rate 18, height 5\' 3"  (1.6 m), weight 44.5 kg, SpO2 100 %.Body mass index is 17.36 kg/m.  General Appearance: Casual  Eye Contact:  Fair  Speech:  Normal Rate  Volume:  Decreased  Mood:  Anxious and Depressed  Affect:  Congruent  Thought Process:  Coherent and Descriptions of Associations: Circumstantial  Orientation:  Full (Time, Place, and Person)  Thought Content:  Logical  Suicidal Thoughts:  No  Homicidal Thoughts:  No  Memory:  Immediate;   Fair Recent;   Fair Remote;   Fair  Judgement:  Intact  Insight:  Fair  Psychomotor Activity:  Normal  Concentration:  Concentration: Fair and Attention Span: Fair  Recall:  of Knowledge:  Fair  Language:  Good  Akathisia:  Negative  Handed:  Right  AIMS (if indicated):     Assets:  Desire for Improvement Financial Resources/Insurance Housing Resilience Social Support  ADL's:  Intact  Cognition:  WNL  Sleep:  Number of Hours: 6     Treatment Plan Summary: Daily contact with patient to assess and evaluate symptoms and progress in  treatment, Medication management and Plan : Patient is seen and examined.  Patient is a 24 year old male with the above-stated past psychiatric history who is seen in follow-up.   Diagnosis: 1.  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 3.  Learning disability 4.  Cannabis use disorder 5.  Mildly elevated blood pressure 6.  Sickle cell trait  Pertinent findings on examination today. 1.  Patient's depression seems to be improving.  He denied suicidal ideation today. 2.  No evidence of active auditory hallucinations.  Plan: 1.  Continue folic acid 1 mg p.o. daily for nutritional supplementation. 2.  Continue hydroxyzine 25 mg p.o. 3 times daily as needed anxiety. 3.  Stop Ativan. 4.  Continue multi vitamin nutritional supplementation. 5.  Continue agitation protocol as needed. 6.  Continue Zofran 8 mg p.o. every 8 hours as needed nausea. 7.  Continue Seroquel 50 mg p.o. daily and 200 mg p.o. nightly for psychosis, sleep and mood stability. 8.  Continue Zoloft 50 mg p.o. daily for anxiety and depression. 9.  Continue trazodone 50 mg p.o. nightly as needed insomnia. 10.  No treatment for mildly elevated blood pressure at least at this point.  Will need to be referred to out side primary care doctor for addressing this issue. 11.  Transfer to [address] when a bed available. 12.  Disposition planning-I anticipate discharge in  1 to 2 days.  Sharma Covert, MD 07/11/2019, 1:20 PM

## 2019-07-11 NOTE — Progress Notes (Signed)
Recreation Therapy Notes  Date: 6.7.21 Time: 1000 Location: [address] Dayroom   Group Topic: Coping Skills  Goal Area(s) Addresses:  Patient will identify positive coping skills. Patient will identify benefit of using positive coping skills post d/c.  Intervention: Worksheet, pencils  Activity:  Orthoptist.  Patients were to identify the things they struggle with that holds them back and write them inside the web. Patients would then identify at least 2 coping skills for each struggle they identified and write on the outside of the web.  Education: Pharmacologist, Building control surveyor.   Education Outcome: Acknowledges understanding/In group clarification offered/Needs additional education.   Clinical Observations/Feedback:  Pt did not attend group session.    Caroll Rancher, LRT/CTRS         Lillia Abed, Nhu Glasby A 07/11/2019 11:30 AM

## 2019-07-11 NOTE — Progress Notes (Signed)
NUTRITION ASSESSMENT RD working remotely.   Pt identified as at risk on the Malnutrition Screen Tool  INTERVENTION: - will order Ensure Enlive BID, each supplement provides 350 kcal and 20 grams of protein. - will order 1 tablet multivitamin with minerals/day.   NUTRITION DIAGNOSIS: Unintentional weight loss related to sub-optimal intake as evidenced by pt report.   Goal: Pt to meet >/= 90% of their estimated nutrition needs.  Monitor:  PO intake  Assessment:  Per notes, patient had attempted self harm PTA by putting a rope around his neck. Notes also indicate patient reported having auditory hallucinations and that he reported SI while in the ED.   Per chart review, weight on 6/4 was 98 lb and weight on 05/18/19 was 106 lb. This indicates 8 lb weight loss (7.5% body weight) in the past 1.5 months; significant for time frame.   24 y.o. male  Height: Ht Readings from Last 1 Encounters:  07/08/19 5\' 3"  (1.6 m)    Weight: Wt Readings from Last 1 Encounters:  07/08/19 44.5 kg    Weight Hx: Wt Readings from Last 10 Encounters:  07/08/19 44.5 kg  07/07/19 49.9 kg  05/18/19 48.1 kg  09/29/18 48.5 kg  09/24/17 49 kg  08/20/17 48.8 kg  09/18/16 49 kg  07/18/13 46.9 kg (1 %, Z= -2.18)*   * Growth percentiles are based on CDC (Boys, 2-20 Years) data.    BMI:  Body mass index is 17.36 kg/m. Pt meets criteria for underweight based on current BMI.  Estimated Nutritional Needs: Kcal: 25-30 kcal/kg Protein: > 1 gram protein/kg Fluid: 1 ml/kcal  Diet Order:  Diet Order            Diet regular Room service appropriate? Yes; Fluid consistency: Thin  Diet effective now             Pt is also offered choice of unit snacks mid-morning and mid-afternoon.  Pt is eating as desired.   Lab results and medications reviewed.      07/20/13, MS, RD, LDN, CNSC Inpatient Clinical Dietitian RD pager # available in [facility]  After hours/weekend pager # available in  [facility]

## 2019-07-11 NOTE — Tx Team (Signed)
Interdisciplinary Treatment and Diagnostic Plan Update  07/11/2019 Time of Session: 9:05am Dustin Tate MRN: [id number]  Principal Diagnosis: <principal problem not specified>  Secondary Diagnoses: Active Problems:   Suicidal ideations   Current Medications:  Current Facility-Administered Medications  Medication Dose Route Frequency Provider Last Rate Last Admin   acetaminophen (TYLENOL) tablet 650 mg  650 mg Oral Q6H PRN Sharma Covert, MD       alum & mag hydroxide-simeth (MAALOX/MYLANTA) 200-200-20 MG/5ML suspension 30 mL  30 mL Oral Q4H PRN Sharma Covert, MD       folic acid (FOLVITE) tablet 1 mg  1 mg Oral Daily Sharma Covert, MD   1 mg at 07/11/19 8469   hydrOXYzine (ATARAX/VISTARIL) tablet 25 mg  25 mg Oral TID PRN Sharma Covert, MD       OLANZapine zydis (ZYPREXA) disintegrating tablet 10 mg  10 mg Oral Q8H PRN Sharma Covert, MD       And   LORazepam (ATIVAN) tablet 1 mg  1 mg Oral PRN Sharma Covert, MD       And   ziprasidone (GEODON) injection 20 mg  20 mg Intramuscular PRN Sharma Covert, MD       LORazepam (ATIVAN) tablet 1 mg  1 mg Oral Q6H PRN Sharma Covert, MD       magnesium hydroxide (MILK OF MAGNESIA) suspension 30 mL  30 mL Oral Daily PRN Sharma Covert, MD       ondansetron [facility]) tablet 8 mg  8 mg Oral Q8H PRN Sharma Covert, MD       QUEtiapine (SEROQUEL) tablet 200 mg  200 mg Oral QHS Sharma Covert, MD   200 mg at 07/10/19 2048   QUEtiapine (SEROQUEL) tablet 50 mg  50 mg Oral Daily Sharma Covert, MD   50 mg at 07/11/19 6295   sertraline (ZOLOFT) tablet 50 mg  50 mg Oral Daily Sharma Covert, MD   50 mg at 07/11/19 2841   traZODone (DESYREL) tablet 50 mg  50 mg Oral QHS PRN Sharma Covert, MD   50 mg at 07/10/19 2049   PTA Medications: No medications prior to admission.    Patient Stressors:    Patient Strengths:    Treatment Modalities: Medication Management, Group  therapy, Case management,  1 to 1 session with clinician, Psychoeducation, Recreational therapy.   Physician Treatment Plan for Primary Diagnosis: <principal problem not specified> Long Term Goal(s): Improvement in symptoms so as ready for discharge Improvement in symptoms so as ready for discharge   Short Term Goals: Ability to identify changes in lifestyle to reduce recurrence of condition will improve Ability to verbalize feelings will improve Ability to disclose and discuss suicidal ideas Ability to demonstrate self-control will improve Ability to identify and develop effective coping behaviors will improve Ability to maintain clinical measurements within normal limits will improve Ability to identify triggers associated with substance abuse/mental health issues will improve Ability to identify changes in lifestyle to reduce recurrence of condition will improve Ability to verbalize feelings will improve Ability to disclose and discuss suicidal ideas Ability to demonstrate self-control will improve Ability to identify and develop effective coping behaviors will improve Ability to maintain clinical measurements within normal limits will improve Ability to identify triggers associated with substance abuse/mental health issues will improve  Medication Management: Evaluate patient's response, side effects, and tolerance of medication regimen.  Therapeutic Interventions: 1 to 1 sessions, Unit Group sessions and  Medication administration.  Evaluation of Outcomes: Not Met  Physician Treatment Plan for Secondary Diagnosis: Active Problems:   Suicidal ideations  Long Term Goal(s): Improvement in symptoms so as ready for discharge Improvement in symptoms so as ready for discharge   Short Term Goals: Ability to identify changes in lifestyle to reduce recurrence of condition will improve Ability to verbalize feelings will improve Ability to disclose and discuss suicidal ideas Ability to  demonstrate self-control will improve Ability to identify and develop effective coping behaviors will improve Ability to maintain clinical measurements within normal limits will improve Ability to identify triggers associated with substance abuse/mental health issues will improve Ability to identify changes in lifestyle to reduce recurrence of condition will improve Ability to verbalize feelings will improve Ability to disclose and discuss suicidal ideas Ability to demonstrate self-control will improve Ability to identify and develop effective coping behaviors will improve Ability to maintain clinical measurements within normal limits will improve Ability to identify triggers associated with substance abuse/mental health issues will improve     Medication Management: Evaluate patient's response, side effects, and tolerance of medication regimen.  Therapeutic Interventions: 1 to 1 sessions, Unit Group sessions and Medication administration.  Evaluation of Outcomes: Not Met   RN Treatment Plan for Primary Diagnosis: <principal problem not specified> Long Term Goal(s): Knowledge of disease and therapeutic regimen to maintain health will improve  Short Term Goals: Ability to remain free from injury will improve, Ability to verbalize feelings will improve, Ability to disclose and discuss suicidal ideas, Ability to identify and develop effective coping behaviors will improve and Compliance with prescribed medications will improve  Medication Management: RN will administer medications as ordered by provider, will assess and evaluate patient's response and provide education to patient for prescribed medication. RN will report any adverse and/or side effects to prescribing provider.  Therapeutic Interventions: 1 on 1 counseling sessions, Psychoeducation, Medication administration, Evaluate responses to treatment, Monitor vital signs and CBGs as ordered, Perform/monitor CIWA, COWS, AIMS and Fall Risk  screenings as ordered, Perform wound care treatments as ordered.  Evaluation of Outcomes: Not Met   LCSW Treatment Plan for Primary Diagnosis: <principal problem not specified> Long Term Goal(s): Safe transition to appropriate next level of care at discharge, Engage patient in therapeutic group addressing interpersonal concerns.  Short Term Goals: Engage patient in aftercare planning with referrals and resources, Increase social support, Increase ability to appropriately verbalize feelings, Identify triggers associated with mental health/substance abuse issues and Increase skills for wellness and recovery  Therapeutic Interventions: Assess for all discharge needs, 1 to 1 time with Social worker, Explore available resources and support systems, Assess for adequacy in community support network, Educate family and significant other(s) on suicide prevention, Complete Psychosocial Assessment, Interpersonal group therapy.  Evaluation of Outcomes: Not Met   Progress in Treatment: Attending groups: No. Participating in groups: No. Taking medication as prescribed: Yes. Toleration medication: Yes. Family/Significant other contact made: Yes, individual(s) contacted:  Mother. Patient understands diagnosis: Yes. Discussing patient identified problems/goals with staff: Yes. Medical problems stabilized or resolved: No. Denies suicidal/homicidal ideation: Yes. Issues/concerns per patient self-inventory: No. Other: none  New problem(s) identified: Yes, Describe:  currently declining follow up care. CSW will continue to assess.   New Short Term/Long Term Goal(s): detox, medication management for mood stabilization; elimination of SI thoughts; development of comprehensive mental wellness/sobriety plan  Patient Goals: "Be sober. Detox."  Discharge Plan or Barriers: Patient will return home to live with mother. Has declined follow up care at this  time.   Reason for Continuation of Hospitalization:  Depression Medication stabilization Suicidal ideation  Estimated Length of Stay: 3-5 days   Attendees: Patient: Dustin Tate 07/11/2019  Physician: Myles Lipps, MD 07/11/2019   Nursing:  07/11/2019   RN Care Manager: 07/11/2019   Social Worker: Stephanie Acre, [place] 07/11/2019   Recreational Therapist:  07/11/2019   Other:  07/11/2019   Other:  07/11/2019   Other: 07/11/2019     Scribe for Treatment Team: Vassie Moselle, [place] 07/11/2019 10:47 AM

## 2019-07-11 NOTE — Progress Notes (Signed)
Recreation Therapy Notes  INPATIENT RECREATION THERAPY ASSESSMENT  Patient Details Name: Dustin Tate MRN: [id number] DOB: 1996-01-11 Today's Date: 07/11/2019       Information Obtained From: Patient  Able to Participate in Assessment/Interview: Yes  Patient Presentation: Alert  Reason for Admission (Per Patient): Other (Comments)(Pt stated 30 day detox)  Patient Stressors: (None)  Coping Skills:   Sports, Exercise, Avoidance  Leisure Interests (2+):  Games - Video games, Individual - Other (Comment)(Sleep)  Frequency of Recreation/Participation: Other (Comment)(Daily)  Awareness of Community Resources:  Yes  Community Resources:  Library, Engineer, petroleum  Current Use: No  If no, Barriers?: (None given)  Expressed Interest in [employer] Information: No  County of Residence:  [place]  Patient Main Form of Transportation: Set designer  Patient Strengths:  Reading  Patient Identified Areas of Improvement:  None  Patient Goal for Hospitalization:  "get out"  Current SI (including self-harm):  No  Current HI:  No  Current AVH: No  Staff Intervention Plan: Group Attendance, Collaborate with Interdisciplinary Treatment Team  Consent to Intern Participation: N/A    Caroll Rancher, LRT/CTRS  Caroll Rancher A 07/11/2019, 12:55 PM

## 2019-07-11 NOTE — Progress Notes (Signed)
Psychoeducational Group Note  Date:  07/11/2019 Time:  2212  Group Topic/Focus:  Wrap-Up Group:   The focus of this group is to help patients review their daily goal of treatment and discuss progress on daily workbooks.  Participation Level: Did Not Attend  Participation Quality:  Not Applicable  Affect:  Not Applicable  Cognitive:  Not Applicable  Insight:  Not Applicable  Engagement in Group: Not Applicable  Additional Comments:  The patient did not attend group this evening.   Hazle Coca S 07/11/2019, 10:12 PM

## 2019-07-11 NOTE — Progress Notes (Signed)
   07/11/19 1000  Psych Admission Type (Psych Patients Only)  Admission Status Voluntary  Psychosocial Assessment  Patient Complaints Depression  Eye Contact Brief  Facial Expression Sullen;Sad  Affect Depressed;Sad;Sullen  Speech Logical/coherent;Soft;Slow  Interaction Cautious;Guarded;Isolative  Motor Activity Slow  Appearance/Hygiene In scrubs  Behavior Characteristics Cooperative  Mood Depressed  Thought Process  Coherency Blocking  Content Paranoia  Delusions Paranoid  Perception Hallucinations  Hallucination Auditory  Judgment Impaired  Confusion None  Danger to Self  Current suicidal ideation? Denies  Self-Injurious Behavior Some self-injurious ideation observed or expressed.  No lethal plan expressed   Agreement Not to Harm Self Yes  Description of Agreement verbal contract for safety  Danger to Others  Danger to Others None reported or observed

## 2019-07-12 DIAGNOSIS — F23 Brief psychotic disorder: Secondary | ICD-10-CM

## 2019-07-12 MED ORDER — QUETIAPINE FUMARATE 400 MG PO TABS
400.0000 mg | ORAL_TABLET | Freq: Every day | ORAL | Status: DC
  Administered 2019-07-12 – 2019-07-13 (×2): 400 mg via ORAL
  Filled 2019-07-12 (×3): qty 1

## 2019-07-12 MED ORDER — QUETIAPINE FUMARATE 100 MG PO TABS
100.0000 mg | ORAL_TABLET | Freq: Every day | ORAL | Status: DC
  Administered 2019-07-13: 100 mg via ORAL
  Filled 2019-07-12 (×2): qty 1

## 2019-07-12 NOTE — Progress Notes (Signed)
   07/12/19 0610  Psych Admission Type (Psych Patients Only)  Admission Status Voluntary  Psychosocial Assessment  Patient Complaints None  Eye Contact Brief  Facial Expression Sullen;Sad  Affect Depressed;Sad;Sullen  Speech Logical/coherent;Soft;Slow  Interaction Cautious;Guarded;Isolative  Motor Activity Slow  Appearance/Hygiene Unremarkable  Behavior Characteristics Cooperative  Mood Depressed;Sad  Thought Process  Coherency Blocking  Content UTA  Delusions None reported or observed  Perception WDL  Hallucination None reported or observed  Judgment Impaired  Confusion None  Danger to Self  Current suicidal ideation? Denies  Danger to Others  Danger to Others None reported or observed   Pt slept all shift. Spoke to him in morning.

## 2019-07-12 NOTE — Progress Notes (Signed)
   07/12/19 0614  Vital Signs  Temp 98.4 F (36.9 C)  Temp Source Oral  Pulse Rate 95  BP 124/88  BP Location Right Arm  BP Method Automatic  Patient Position (if appropriate) Standing  Sleep  Number of Hours 6.75  D: Patient Presents with depressed affect.  Patient was calm and cooperative during med pass and took his medicine without incident.  Patient was isolative in his room. Patient denies suicidal thoughts and self harming thoughts. Patient rates depression and anxiety 6/10. A:  Patient took scheduled medicine.  Support and encouragement provided Routine safety checks conducted every 15 minutes. Patient  Informed to notify staff with any concerns.  R: Safety maintained.

## 2019-07-12 NOTE — Progress Notes (Signed)
Patient verbalized in group that he felt more energetic today. In addition, he exercised as a means of coping. His goal for tomorrow is to be more social.

## 2019-07-12 NOTE — Progress Notes (Signed)
[facility] MD Progress Note  07/12/2019 10:25 AM Dustin Tate  MRN:  [id number] Subjective:  Patient is a 24 year old male who presented to the [facility] emergency department on 07/07/2019 with new onset auditory hallucinations and suicidal ideation. He had actually put a rope around his neck and attempt to hang himself the day prior to admission.  Patient found sitting in his room. He appears guarded with quiet speech, difficult to understand at times. He is significantly paranoid. He states his family "opted out." He initially declines to elaborate, but then voices belief that his family is "after him." He says he will be homeless at discharge because his family does not support him. He is reluctant to speak and has been isolating to his room.   He denies AVH currently but does admit to [facility] overnight calling him "a faggot and queer." He says he did not sleep overnight related to the [facility]. He says he was thinking about "escaping" last night. With further questioning, he states he was thinking about trying to escape from the hospital but is unable to state why. He denies SI in the hospital but states, "I don't know what I'll do once I leave. I have nowhere to go." I have reviewed with CSW, who confirms mother has been supportive and stated patient is able to return home to live with her. Nursing reports patient has been guarded and isolating to his room. No agitated behaviors.  Principal Problem: <principal problem not specified> Diagnosis: Active Problems:   Suicidal ideations  Total Time spent with patient: 15 minutes  Past Psychiatric History: See admission H&P  Past Medical History:  Past Medical History:  Diagnosis Date  . Depression   . 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 Sickle cell trait (HCC)    History reviewed. No pertinent surgical history. Family History:  Family History  Problem Relation Age of Onset  . Healthy Mother   . Healthy Father    Family Psychiatric   History:See admission H&P Social History:  Social History   Substance and Sexual Activity  Alcohol Use No     Social History   Substance and Sexual Activity  Drug Use Yes  . Frequency: 2.0 times per week  . Types: Marijuana   Comment: Advised to quit    Social History   Socioeconomic History  . Marital status: Single    Spouse name: Not on file  . Number of children: Not on file  . Years of education: Not on file  . Highest education level: Not on file  Occupational History  . 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 Smoking status: Current Some Day Smoker    Packs/day: 0.10    Years: 2.00    Pack years: 0.20    Types: Cigarettes    Start date: 2016  . Smokeless tobacco: Never Used  Substance and Sexual Activity  . Alcohol use: No  . Drug use: Yes    Frequency: 2.0 times per week    Types: Marijuana    Comment: Advised to quit  . Sexual activity: Not Currently    Partners: Female    Birth control/protection: Condom  Other Topics Concern  . 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 Difficulty of Paying Living Expenses:   Food Insecurity:   .  Worried About Programme researcher, broadcasting/film/video in the Last Year:   . Barista in the Last Year:   Transportation Needs:   . Freight forwarder (Medical):   Marland Kitchen Lack of Transportation (Non-Medical):   Physical Activity:   . Days of Exercise per Week:   . Minutes of Exercise per Session:   Stress:   . Feeling of Stress :   Social Connections:   . Frequency of Communication with Friends and Family:   . Frequency of Social Gatherings with Friends and Family:   . Attends Religious Services:   . Active Member of Clubs or Organizations:   . Attends Banker Meetings:   Marland Kitchen Marital Status:    Additional Social History:                         Sleep:  Good  Appetite:  Good  Current Medications: Current Facility-Administered Medications  Medication Dose Route Frequency Provider Last Rate Last Admin  . acetaminophen (TYLENOL) tablet 650 mg  650 mg Oral Q6H PRN Antonieta Pert, MD      . alum & mag hydroxide-simeth (MAALOX/MYLANTA) 200-200-20 MG/5ML suspension 30 mL  30 mL Oral Q4H PRN Antonieta Pert, MD      . feeding supplement (ENSURE ENLIVE) (ENSURE ENLIVE) liquid 237 mL  237 mL Oral BID BM Antonieta Pert, MD   237 mL at 07/12/19 1004  . folic acid (FOLVITE) tablet 1 mg  1 mg Oral Daily Antonieta Pert, MD   1 mg at 07/12/19 0820  . hydrOXYzine (ATARAX/VISTARIL) tablet 25 mg  25 mg Oral TID PRN Antonieta Pert, MD      . OLANZapine zydis (ZYPREXA) disintegrating tablet 10 mg  10 mg Oral Q8H PRN Antonieta Pert, MD       And  . LORazepam (ATIVAN) tablet 1 mg  1 mg Oral PRN Antonieta Pert, MD       And  . ziprasidone (GEODON) injection 20 mg  20 mg Intramuscular PRN Antonieta Pert, MD      . LORazepam (ATIVAN) tablet 1 mg  1 mg Oral Q6H PRN Antonieta Pert, MD      . magnesium hydroxide (MILK OF MAGNESIA) suspension 30 mL  30 mL Oral Daily PRN Antonieta Pert, MD      . multivitamin with minerals tablet 1 tablet  1 tablet Oral Daily Antonieta Pert, MD   1 tablet at 07/12/19 0820  . ondansetron (ZOFRAN) tablet 8 mg  8 mg Oral Q8H PRN Antonieta Pert, MD      . Melene Muller ON 07/13/2019] QUEtiapine (SEROQUEL) tablet 100 mg  100 mg Oral Daily Aldean Baker, NP      . QUEtiapine (SEROQUEL) tablet 400 mg  400 mg Oral QHS Aldean Baker, NP      . sertraline (ZOLOFT) tablet 50 mg  50 mg Oral Daily Antonieta Pert, MD   50 mg at 07/12/19 0820  . traZODone (DESYREL) tablet 50 mg  50 mg Oral QHS PRN Antonieta Pert, MD   50 mg at 07/10/19 2049    Lab Results:  Results for orders placed or performed during the hospital encounter of 07/08/19 (from the past 48 hour(s))  TSH     Status: None   Collection  Time: 07/10/19  6:05 PM  Result Value Ref Range   TSH 1.009 0.350 - 4.500 uIU/mL  Comment: Performed by a 3rd Generation assay with a functional sensitivity of <=0.01 uIU/mL. Performed at [facility], [place] [address], [place], [place] [address]     Blood Alcohol level:  Lab Results  Component Value Date   ETH <10 98/33/8250    Metabolic Disorder Labs: No results found for: HGBA1C, MPG No results found for: PROLACTIN No results found for: CHOL, TRIG, HDL, CHOLHDL, VLDL, LDLCALC  Physical Findings: AIMS:  , ,  ,  ,    CIWA:    COWS:     Musculoskeletal: Strength & Muscle Tone: within normal limits Gait & Station: normal Patient leans: N/A  Psychiatric Specialty Exam: Physical Exam  Nursing note and vitals reviewed. Constitutional: He is oriented to person, place, and time. He appears well-developed and well-nourished.  Cardiovascular: Normal rate.  Respiratory: Effort normal.  Neurological: He is alert and oriented to person, place, and time.    Review of Systems  Constitutional: Negative.   Respiratory: Negative for cough and shortness of breath.   Psychiatric/Behavioral: Positive for dysphoric mood, hallucinations and sleep disturbance. Negative for agitation, behavioral problems, confusion, self-injury and suicidal ideas. The patient is not nervous/anxious and is not hyperactive.     Blood pressure 124/88, pulse 95, temperature 98.4 F (36.9 C), temperature source Oral, resp. rate 18, height 5\' 3"  (1.6 m), weight 44.5 kg, SpO2 100 %.Body mass index is 17.36 kg/m.  General Appearance: Guarded  Eye Contact:  Fair  Speech:  Slow  Volume:  Decreased  Mood:  Anxious and Depressed  Affect:  Flat  Thought Process:  Descriptions of Associations: Tangential  Orientation:  Full (Time, Place, and Person)  Thought Content:  Delusions and Paranoid Ideation  Suicidal Thoughts:  Yes.  without intent/plan  Homicidal Thoughts:  No  Memory:  Immediate;    Fair Recent;   Fair  Judgement:  Impaired  Insight:  Lacking  Psychomotor Activity:  Decreased  Concentration:  Concentration: Fair and Attention Span: Fair  Recall:  [employer] of Knowledge:  Fair  Language:  Good  Akathisia:  No  Handed:  Right  AIMS (if indicated):     Assets:  Financial Resources/Insurance Housing Physical Health Social Support  ADL's:  Intact  Cognition:  WNL  Sleep:  Number of Hours: 6.75     Treatment Plan Summary: Daily contact with patient to assess and evaluate symptoms and progress in treatment and Medication management   Continue inpatient hospitalization.  Increase Seroquel to 100 mg PO QAM, 400 mg PO QHS for psychosis/sleep Continue Zoloft 50 mg PO daily for depression/anxiety Continue Vistaril 25 mg PO TID PRN anxiety Continue trazodone 50 mg PO QHS PRN insomnia Continue folic acid 1 mg PO daily for supplementation Continue agitation protocol PRN agitation  Patient will participate in the therapeutic group milieu.  Discharge disposition in progress.   Connye Burkitt, NP 07/12/2019, 10:25 AM

## 2019-07-12 NOTE — Progress Notes (Signed)
Recreation Therapy Notes  Animal-Assisted Activity (AAA) Program Checklist/Progress Notes Patient Eligibility Criteria Checklist & Daily Group note for Rec Tx Intervention  Date: 6.8.21 Time: 1430 Location: [address]   AAA/T Program Assumption of Risk Form signed by Engineer, production or Parent Legal Guardian  YES   Patient is free of allergies or sever asthma  YES   Patient reports no fear of animals YES   Patient reports no history of cruelty to animals YES   Patient understands his/her participation is voluntary  YES   Patient washes hands before animal contact  YES   Patient washes hands after animal contact  YES   Behavioral Response: None  Education: Charity fundraiser, Appropriate Animal Interaction   Education Outcome: Acknowledges understanding/In group clarification offered/Needs additional education.   Clinical Observations/Feedback: Pt came in for the last 10 minutes of group and observed.    Caroll Rancher, LRT/CTRS    Caroll Rancher A 07/12/2019 3:34 PM

## 2019-07-13 DIAGNOSIS — F23 Brief psychotic disorder: Secondary | ICD-10-CM | POA: Diagnosis not present

## 2019-07-13 NOTE — Progress Notes (Signed)
   07/12/19 2200  Psych Admission Type (Psych Patients Only)  Admission Status Voluntary  Psychosocial Assessment  Patient Complaints None  Eye Contact Brief  Facial Expression Sullen;Sad  Affect Depressed;Sad;Sullen  Speech Logical/coherent;Soft;Slow  Interaction Cautious;Guarded;Isolative  Motor Activity Slow  Appearance/Hygiene Unremarkable  Behavior Characteristics Cooperative  Mood Depressed  Thought Process  Coherency Blocking  Content UTA  Delusions None reported or observed  Perception WDL  Hallucination None reported or observed  Judgment Impaired  Confusion None  Danger to Self  Current suicidal ideation? Denies  Danger to Others  Danger to Others None reported or observed  Pt. Presents as depressed but cooperative.  Pt. Provided with medication as ordered but reports that nothing has worked for him so far and he has had little sleep.  Pt. Provided with support and encouragement, he remains safe on the unit.

## 2019-07-13 NOTE — Progress Notes (Signed)
[facility] MD Progress Note  07/13/2019 1:44 PM Dustin Tate  MRN:  [id number] Subjective:  He reports he is feeling " all right". He reports feeling somewhat sedated, tired from medication , denies other side effects. Denies suicidal ideations.  He reports auditory hallucinations have decreased but not completely resolved .   Objective : I have discussed case with treatment team and have met with patient. 24 y old male, presented to hospital on 6/5 reporting suicidal ideations /thoughts of hanging self and auditory hallucinations, descried as demeaning /critical . States these had started a few  weeks ago Initially presented guarded/ limited historian. Endorses regular cannabis use. No prior history of psychiatric illness.  Today patient reports he is feeling better, and describes improving mood. Currently denies suicidal ideations . Denies medication side effects other than feeling tired. At this time presents alert and attentive. He does report his energy level has improved and states  " I've been starting to exercise , and reports has been doing push ups in this room" Reports hallucinations have improved and decreased, but have not completely resolved . Describes voices have become " quieter". No disruptive or agitated behaviors on unit. Cooperative but cautious during session. Limited group participation, tends to spend most time in his room.   Principal Problem: Brief psychotic disorder ([place]) Diagnosis: Principal Problem:   Brief psychotic disorder ([place]) Active Problems:   Suicidal ideations  Total Time spent with patient: 20 minutes  Past Psychiatric History: See admission H&P  Past Medical History:  Past Medical History:  Diagnosis Date  . Depression   . 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 Sickle cell trait ([place])    History reviewed. No pertinent surgical history. Family History:  Family History  Problem Relation Age of Onset  . Healthy Mother   . Healthy Father    Family Psychiatric   History:See admission H&P Social History:  Social History   Substance and Sexual Activity  Alcohol Use No     Social History   Substance and Sexual Activity  Drug Use Yes  . Frequency: 2.0 times per week  . Types: Marijuana   Comment: Advised to quit    Social History   Socioeconomic History  . Marital status: Single    Spouse name: Not on file  . Number of children: Not on file  . Years of education: Not on file  . Highest education level: Not on file  Occupational History  . 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 Smoking status: Current Some Day Smoker    Packs/day: 0.10    Years: 2.00    Pack years: 0.20    Types: Cigarettes    Start date: 2016  . Smokeless tobacco: Never Used  Substance and Sexual Activity  . Alcohol use: No  . Drug use: Yes    Frequency: 2.0 times per week    Types: Marijuana    Comment: Advised to quit  . Sexual activity: Not Currently    Partners: Female    Birth control/protection: Condom  Other Topics Concern  . 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 Difficulty of Paying Living Expenses:   Food Insecurity:   . Worried About Charity fundraiser in the Last Year:   . [place] in the Last Year:  Transportation Needs:   . Film/video editor (Medical):   Marland Kitchen Lack of Transportation (Non-Medical):   Physical Activity:   . Days of Exercise per Week:   . Minutes of Exercise per Session:   Stress:   . Feeling of Stress :   Social Connections:   . Frequency of Communication with Friends and Family:   . Frequency of Social Gatherings with Friends and Family:   . Attends Religious Services:   . Active Member of Clubs or Organizations:   . Attends Archivist Meetings:   Marland Kitchen Marital Status:    Additional Social History:   Sleep: Good  Appetite:   Good  Current Medications: Current Facility-Administered Medications  Medication Dose Route Frequency Provider Last Rate Last Admin  . acetaminophen (TYLENOL) tablet 650 mg  650 mg Oral Q6H PRN Sharma Covert, MD      . alum & mag hydroxide-simeth (MAALOX/MYLANTA) 200-200-20 MG/5ML suspension 30 mL  30 mL Oral Q4H PRN Sharma Covert, MD      . feeding supplement (ENSURE ENLIVE) (ENSURE ENLIVE) liquid 237 mL  237 mL Oral BID BM Sharma Covert, MD   237 mL at 07/13/19 1116  . folic acid (FOLVITE) tablet 1 mg  1 mg Oral Daily Sharma Covert, MD   1 mg at 07/13/19 4970  . hydrOXYzine (ATARAX/VISTARIL) tablet 25 mg  25 mg Oral TID PRN Sharma Covert, MD      . OLANZapine zydis (ZYPREXA) disintegrating tablet 10 mg  10 mg Oral Q8H PRN Sharma Covert, MD       And  . LORazepam (ATIVAN) tablet 1 mg  1 mg Oral PRN Sharma Covert, MD       And  . ziprasidone (GEODON) injection 20 mg  20 mg Intramuscular PRN Sharma Covert, MD      . magnesium hydroxide (MILK OF MAGNESIA) suspension 30 mL  30 mL Oral Daily PRN Sharma Covert, MD      . multivitamin with minerals tablet 1 tablet  1 tablet Oral Daily Sharma Covert, MD   1 tablet at 07/13/19 0806  . ondansetron (ZOFRAN) tablet 8 mg  8 mg Oral Q8H PRN Sharma Covert, MD      . QUEtiapine (SEROQUEL) tablet 100 mg  100 mg Oral Daily Connye Burkitt, NP   100 mg at 07/13/19 0806  . QUEtiapine (SEROQUEL) tablet 400 mg  400 mg Oral QHS Connye Burkitt, NP   400 mg at 07/12/19 2130  . sertraline (ZOLOFT) tablet 50 mg  50 mg Oral Daily Sharma Covert, MD   50 mg at 07/13/19 0806  . traZODone (DESYREL) tablet 50 mg  50 mg Oral QHS PRN Sharma Covert, MD   50 mg at 07/12/19 2130    Lab Results:  No results found for this or any previous visit (from the past 48 hour(s)).  Blood Alcohol level:  Lab Results  Component Value Date   ETH <10 26/37/8588    Metabolic Disorder Labs: No results found for: HGBA1C,  MPG No results found for: PROLACTIN No results found for: CHOL, TRIG, HDL, CHOLHDL, VLDL, LDLCALC  Physical Findings: AIMS:  , ,  ,  ,    CIWA:    COWS:     Musculoskeletal: Strength & Muscle Tone: within normal limits Gait & Station: normal Patient leans: N/A  Psychiatric Specialty Exam: Physical Exam  Nursing note and vitals reviewed. Constitutional: He is oriented to  person, place, and time. He appears well-developed and well-nourished.  Cardiovascular: Normal rate.  Respiratory: Effort normal.  Neurological: He is alert and oriented to person, place, and time.    Review of Systems  Constitutional: Negative.   Respiratory: Negative for cough and shortness of breath.   Psychiatric/Behavioral: Positive for dysphoric mood, hallucinations and sleep disturbance. Negative for agitation, behavioral problems, confusion, self-injury and suicidal ideas. The patient is not nervous/anxious and is not hyperactive.   reports headache earlier today, now resolved , no chest pain, no shortness of breath, no nausea or vomiting   Blood pressure 130/74, pulse 97, temperature 98.1 F (36.7 C), temperature source Oral, resp. rate 18, height 5' 3"  (1.6 m), weight 44.5 kg, SpO2 100 %.Body mass index is 17.36 kg/m.  General Appearance: Fairly Groomed  Eye Contact:  Fair  Speech:  Normal Rate  Volume:  Decreased  Mood:  reports feeling better, describes mood as 7/10 with 10 being best   Affect:  restricted, cautious  Thought Process:  Linear and Descriptions of Associations: Intact- at this time thought process seems linear   Orientation:  Full (Time, Place, and Person)  Thought Content:  reports improving , resolving hallucinations, not internally preoccupied at this time, no delusions currently expressed  Suicidal Thoughts:  No denies suicidal or self injurious ideations  Homicidal Thoughts:  Nodenies HI or violent idetaions  Memory:  recent and remote grossly intact   Judgement:  Fair   Insight:  Fair  Psychomotor Activity:  Normal- no psychomotor agitation or restlessness   Concentration:  Concentration: Fair and Attention Span: Fair  Recall:  Good  Fund of Knowledge:  Good  Language:  Good  Akathisia:  No  Handed:  Right  AIMS (if indicated):     Assets:  Financial Resources/Insurance Housing Physical Health Social Support  ADL's:  Intact  Cognition:  WNL  Sleep:  Number of Hours: 6.75   Assessment -  76 y old male, presented to hospital on 6/5 reporting suicidal ideations /thoughts of hanging self and auditory hallucinations, descried as demeaning /critical . States these had started a few  weeks ago Initially presented guarded/ limited historian. Endorses regular cannabis use. No prior history of psychiatric illness.  Patient reports that he is feeling better . Reports improving mood , denies SI and states auditory hallucinations have subsided. At this time his behavior is in good control/does not appear internally preoccupied. Tolerating medications well, does report feeling subjectively tired .Will discontinue AM Seroquel to help address and D/C Trazodone  Treatment Plan Summary: Daily contact with patient to assess and evaluate symptoms and progress in treatment and Medication management  Treatment Plan reviewed as below today 6/9 Encourage group and milieu participation Encourage sobriety and abstinence- have reviewed potential negative impact that cannabis use can have on mental health  Decrease Seroquel to  400 mg PO QHS for psychosis/sleep Continue Zoloft 50 mg PO daily for depression/anxiety Continue Vistaril 25 mg PO TID PRN anxiety Continue folic acid 1 mg PO daily for supplementation Continue agitation protocol PRN agitation Treatment team is working on disposition [place], MD 07/13/2019, 1:44 PM   Patient ID: Elenore Rota, male   DOB: Dec 22, 1995, 24 y.o.   MRN: [id number]

## 2019-07-13 NOTE — BHH Group Notes (Addendum)
LCSW Group Therapy Note  Type of Therapy/Topic: Group Therapy: Six Dimensions of Wellness  Participation Level: Did Not Attend  Description of Group: This group will address the concept of wellness and the six concepts of wellness: occupational, physical, social, intellectual, spiritual, and emotional. Patients will be encouraged to process areas in their lives that are out of balance and identify reasons for remaining unbalanced. Patients will be encouraged to explore ways to practice healthy habits daily to attain better physical and mental health outcomes.  Therapeutic Goals: 1. Identify aspects of wellness that they are doing well. 2. Identify aspects of wellness that they would like to improve upon. 3. Identify one action they can take to improve an aspect of wellness in their lives.   Summary of Patient Progress: Pt was invited and chose not to attend.     Therapeutic Modalities: Cognitive Behavioral Therapy Solution-Focused Therapy Relapse Prevention   Yaeli Hartung MSW, LCSWA Clincal Social Worker  [facility]     

## 2019-07-13 NOTE — BHH Counselor (Signed)
CSW spoke with mother, Rudolfo Brandow [phone]). Mother called from [facility] lobby seeking updates regarding treatment and progress. Mother also wanted to confirm receipt of FMLA documentation, which CSW has completed.  Enid Cutter, MSW, LCSW-A Clinical Social Worker [facility]

## 2019-07-13 NOTE — Progress Notes (Signed)
The change that the patient would like to make following discharge is that he wants to feel more independent. His goal for tomorrow is to get up early.

## 2019-07-13 NOTE — Progress Notes (Signed)
Pt denies SI/HI/AVH.  Pt is guarded with his speech and it is difficult for pt to describe his feelings in depth.  Pt spends some time in the day room with his peers, but he often stays in his room.  Q 15 min safety checks remain in place. RN will continue to monitor and provide support as needed.

## 2019-07-14 DIAGNOSIS — F23 Brief psychotic disorder: Secondary | ICD-10-CM | POA: Diagnosis not present

## 2019-07-14 MED ORDER — QUETIAPINE FUMARATE 50 MG PO TABS
50.0000 mg | ORAL_TABLET | Freq: Every day | ORAL | Status: DC
  Administered 2019-07-14 – 2019-07-15 (×2): 50 mg via ORAL
  Filled 2019-07-14 (×5): qty 1

## 2019-07-14 MED ORDER — QUETIAPINE FUMARATE 400 MG PO TABS
500.0000 mg | ORAL_TABLET | Freq: Every day | ORAL | Status: DC
  Administered 2019-07-14 – 2019-07-16 (×3): 500 mg via ORAL
  Filled 2019-07-14 (×5): qty 1

## 2019-07-14 NOTE — Progress Notes (Addendum)
   07/14/19 0640  Vital Signs  Temp 98 F (36.7 C)  Temp Source Oral  Pulse Rate 98  BP 118/85  BP Location Right Arm  BP Method Automatic  Patient Position (if appropriate) Standing  Sleep  Number of Hours 6.25   D: Patient took all medicines. Patient was out in open areas.Patient rated depression and anxiety 2/10.  A:  Patient took scheduled medicine.  Support and encouragement provided Routine safety checks conducted every 15 minutes. Patient  Informed to notify staff with any concerns.   R:  Safety maintained.

## 2019-07-14 NOTE — Progress Notes (Signed)
       07/13/19 1950  Psych Admission Type (Psych Patients Only)  Admission Status Voluntary  Psychosocial Assessment  Patient Complaints Worrying  Eye Contact Fair  Facial Expression Sad;Anxious;Animated  Affect Depressed;Sad;Anxious  Speech Logical/coherent;Soft;Slow  Interaction Assertive  Motor Activity Slow  Appearance/Hygiene Unremarkable  Behavior Characteristics Cooperative;Fidgety;Resistant to care  Mood Anxious;Pleasant  Thought Process  Coherency Blocking  Content WDL  Delusions None reported or observed  Perception WDL  Hallucination None reported or observed  Judgment WDL  Confusion None  Danger to Self  Current suicidal ideation? Denies  Danger to Others  Danger to Others None reported or observed

## 2019-07-14 NOTE — Progress Notes (Signed)
[facility] Group Notes:  (Nursing/MHT/Case Management/Adjunct)  Date:  07/14/2019  Time:  2030  Type of Therapy:  wrap up group  Participation Level:  Active  Participation Quality:  Appropriate, Attentive, Sharing and Supportive  Affect:  Anxious and Depressed  Cognitive:  Appropriate  Insight:  Improving  Engagement in Group:  Engaged  Modes of Intervention:  Clarification, Education and Support  Summary of Progress/Problems: Positive thinking and positive change were discussed.   Johann Capers S 07/14/2019, 10:03 PM

## 2019-07-14 NOTE — Progress Notes (Signed)
[facility] MD Progress Note  07/14/2019 11:15 AM Dustin Tate  MRN:  [id number]    Subjective: Follow-up for his 24 year old male diagnosed with brief psychotic disorder.  Patient reports that he is doing some better today.  He states that he is feeling little groggy from taking Seroquel.  He reports he is still having auditory hallucinations and that some of them have been command.  He states that they have told him to kill himself and that was the one that he had this morning.  He reports that when he is alone by himself that hallucinations are worse.  He states that what has improved about them is that they are less in frequency now.  He reports that he has multiple voices and they do sound recognizable and they have been male and male voices.  He reports that his mood has improved because he is feeling better and is decreased his hallucinations.  He denies any suicidal or homicidal ideations.  He reports reports still having a good appetite.  He also reports that his hands have been shaking some which started today.  Principal Problem: Brief psychotic disorder (HCC) Diagnosis: Principal Problem:   Brief psychotic disorder (HCC) Active Problems:   Suicidal ideations  Total Time spent with patient: 30 minutes  Past Psychiatric History: No known past psychiatric history outside of a learning disability.  Patient denied any previous psychiatric hospitalizations or psychiatric medications.  Past Medical History:  Past Medical History:  Diagnosis Date  . Depression   . 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 Sickle cell trait (HCC)    History reviewed. No pertinent surgical history. Family History:  Family History  Problem Relation Age of Onset  . Healthy Mother   . Healthy Father    Family Psychiatric  History: None reported Social History:  Social History   Substance and Sexual Activity  Alcohol Use No     Social History   Substance and Sexual Activity  Drug Use Yes  . Frequency: 2.0 times  per week  . Types: Marijuana   Comment: Advised to quit    Social History   Socioeconomic History  . Marital status: Single    Spouse name: Not on file  . Number of children: Not on file  . Years of education: Not on file  . Highest education level: Not on file  Occupational History  . 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 Smoking status: Current Some Day Smoker    Packs/day: 0.10    Years: 2.00    Pack years: 0.20    Types: Cigarettes    Start date: 2016  . Smokeless tobacco: Never Used  Vaping Use  . Vaping Use: Never assessed  Substance and Sexual Activity  . Alcohol use: No  . Drug use: Yes    Frequency: 2.0 times per week    Types: Marijuana    Comment: Advised to quit  . Sexual activity: Not Currently    Partners: Female    Birth control/protection: Condom  Other Topics Concern  . 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 Difficulty of Paying Living Expenses:   Food Insecurity:   . Worried About Programme researcher, broadcasting/film/video in the Last Year:   . Barista in the Last Year:   [employer]  Needs:   . Lack of Transportation (Medical):   Marland Kitchen Lack of Transportation (Non-Medical):   Physical Activity:   . Days of Exercise per Week:   . Minutes of Exercise per Session:   Stress:   . Feeling of Stress :   Social Connections:   . Frequency of Communication with Friends and Family:   . Frequency of Social Gatherings with Friends and Family:   . Attends Religious Services:   . Active Member of Clubs or Organizations:   . Attends Archivist Meetings:   Marland Kitchen Marital Status:    Additional Social History:                         Sleep: Good  Appetite:  Good  Current Medications: Current Facility-Administered Medications  Medication Dose Route Frequency Provider Last Rate Last Admin  .  acetaminophen (TYLENOL) tablet 650 mg  650 mg Oral Q6H PRN Sharma Covert, MD      . alum & mag hydroxide-simeth (MAALOX/MYLANTA) 200-200-20 MG/5ML suspension 30 mL  30 mL Oral Q4H PRN Sharma Covert, MD      . feeding supplement (ENSURE ENLIVE) (ENSURE ENLIVE) liquid 237 mL  237 mL Oral BID BM Sharma Covert, MD   237 mL at 07/14/19 0922  . folic acid (FOLVITE) tablet 1 mg  1 mg Oral Daily Sharma Covert, MD   1 mg at 07/14/19 0805  . hydrOXYzine (ATARAX/VISTARIL) tablet 25 mg  25 mg Oral TID PRN Sharma Covert, MD      . OLANZapine zydis (ZYPREXA) disintegrating tablet 10 mg  10 mg Oral Q8H PRN Sharma Covert, MD       And  . LORazepam (ATIVAN) tablet 1 mg  1 mg Oral PRN Sharma Covert, MD       And  . ziprasidone (GEODON) injection 20 mg  20 mg Intramuscular PRN Sharma Covert, MD      . magnesium hydroxide (MILK OF MAGNESIA) suspension 30 mL  30 mL Oral Daily PRN Sharma Covert, MD      . multivitamin with minerals tablet 1 tablet  1 tablet Oral Daily Sharma Covert, MD   1 tablet at 07/14/19 0805  . ondansetron (ZOFRAN) tablet 8 mg  8 mg Oral Q8H PRN Sharma Covert, MD      . QUEtiapine (SEROQUEL) tablet 400 mg  400 mg Oral QHS Connye Burkitt, NP   400 mg at 07/13/19 2201  . sertraline (ZOLOFT) tablet 50 mg  50 mg Oral Daily Sharma Covert, MD   50 mg at 07/14/19 0805    Lab Results: No results found for this or any previous visit (from the past 32 hour(s)).  Blood Alcohol level:  Lab Results  Component Value Date   ETH <10 36/14/4315    Metabolic Disorder Labs: No results found for: HGBA1C, MPG No results found for: PROLACTIN No results found for: CHOL, TRIG, HDL, CHOLHDL, VLDL, LDLCALC  Physical Findings: AIMS:  , ,  ,  ,    CIWA:    COWS:     Musculoskeletal: Strength & Muscle Tone: within normal limits Gait & Station: normal Patient leans: N/A  Psychiatric Specialty Exam: Physical Exam  Nursing note and vitals  reviewed. Constitutional: He is oriented to person, place, and time. He appears well-developed.  Cardiovascular: Normal rate.  Respiratory: Effort normal.  Musculoskeletal:        General: Normal range  of motion.  Neurological: He is alert and oriented to person, place, and time.  Skin: Skin is warm.    Review of Systems  Constitutional: Negative.   HENT: Negative.   Eyes: Negative.   Respiratory: Negative.   Cardiovascular: Negative.   Gastrointestinal: Negative.   Genitourinary: Negative.   Musculoskeletal: Negative.   Skin: Negative.   Neurological: Negative.   Psychiatric/Behavioral: Positive for hallucinations.    Blood pressure 118/85, pulse 98, temperature 98 F (36.7 C), temperature source Oral, resp. rate 18, height 5\' 3"  (1.6 m), weight 44.5 kg, SpO2 100 %.Body mass index is 17.36 kg/m.  General Appearance: Casual  Eye Contact:  Fair  Speech:  Clear and Coherent and Normal Rate  Volume:  Normal  Mood:  Anxious  Affect:  Congruent  Thought Process:  Coherent and Descriptions of Associations: Intact  Orientation:  Full (Time, Place, and Person)  Thought Content:  WDL and Hallucinations: Auditory  Suicidal Thoughts:  No  Homicidal Thoughts:  No  Memory:  Immediate;   Good Recent;   Good Remote;   Good  Judgement:  Fair  Insight:  Fair  Psychomotor Activity:  Tremor in hands  Concentration:  Concentration: Fair  Recall:  of Knowledge:  Fair  Language:  Good  Akathisia:  Possibile with new report of anxiety with tremors  Handed:  Right  AIMS (if indicated):     Assets:  Communication Skills Desire for Improvement Financial Resources/Insurance Housing Physical Health  ADL's:  Intact  Cognition:  WNL  Sleep:  Number of Hours: 6.25   Assessment: Patient presents in his room lying in the bed and is pleasant, calm, cooperative.  Patient does have tremor in his hands and fingers.  Patient has continued to report auditory hallucinations with some of  them being command.  Consulted with Dr. Fiserv about patient's medications.  We will leave the patient a dose of Seroquel 50 mg now with plan to continue daily for the hallucinations and will continue to monitor for worsening of tremors.  Nighttime dose of Seroquel be increased to 500 mg patient was informed that the grogginess effect of Seroquel should decrease after staying on the medication for several days.  Treatment Plan Summary: Daily contact with patient to assess and evaluate symptoms and progress in treatment and Medication management Increase Seroquel 1200 mg p.o. nightly for psychotic features Start Seroquel 50 mg p.o. daily for hallucinations Continue Zoloft 50 mg p.o. daily for depression 80 Vistaril 25 mg p.o. 3 times daily for anxiety Continue agitation protocol Encourage group therapy participation Continue daily minute safety checks  Jola Babinski [place] Dani, FNP 07/14/2019, 11:15 AM

## 2019-07-14 NOTE — [facility] Counselor (Signed)
CSW spoke with patients mother regarding concerns and progress with patients treatment. Pt's mother reported she has felt that this patient has been improving from conversations she has had with him on the phone the day before, but understands the need for continued treatment before he is able to discharge.     Ruthann Cancer MSW, [employer] Clincal Social Worker  [facility]

## 2019-07-15 DIAGNOSIS — F23 Brief psychotic disorder: Secondary | ICD-10-CM | POA: Diagnosis not present

## 2019-07-15 NOTE — Progress Notes (Signed)
Recreation Therapy Notes  Date: 6.11.21 Time: 0930 Location: 300 Hall Group Room  Group Topic: Stress Management  Goal Area(s) Addresses:  Patient will identify positive stress management techniques. Patient will identify benefits of using stress management post d/c.  Intervention: Stress Management  Activity:  Meditation.  LRT played a meditation that focused on letting go of the past and focusing on the present.  Patients were to listen and follow along as meditation played to engage in activity.   Education:  Stress Management, Discharge Planning.   Education Outcome: Acknowledges Education  Clinical Observations/Feedback: Pt did not attend group session.    Caroll Rancher, LRT/CTRS         Caroll Rancher A 07/15/2019 10:23 AM

## 2019-07-15 NOTE — Progress Notes (Signed)
   07/14/19 2000  Psych Admission Type (Psych Patients Only)  Admission Status Voluntary  Psychosocial Assessment  Patient Complaints None  Eye Contact Fair  Facial Expression Animated;Anxious  Affect Anxious;Depressed  Speech Logical/coherent;Soft;Slow  Interaction Assertive  Motor Activity Slow  Appearance/Hygiene Unremarkable  Behavior Characteristics Cooperative  Mood Anxious;Pleasant  Thought Process  Coherency Blocking  Content WDL  Delusions None reported or observed  Perception WDL  Hallucination None reported or observed  Judgment Impaired  Confusion None  Danger to Self  Current suicidal ideation? Denies  Danger to Others  Danger to Others None reported or observed  Pt. Presents as animated and anxious but pleasant.  He is visible interacting in the day room.  Pt. Forwards little during assessment, stating that he is "doing ok".  Support and encouragement offered, patient medication given as ordered and safety is maintained while on the unit.

## 2019-07-15 NOTE — Progress Notes (Signed)
Pt denied SI/HI/AVH.  Presented with flat affect, but states his mood is "pretty good."  RN administered pt his medications and assess for needs/concerns.  Validated feelings and encouraged pt to come out of pt's room and interact with peers.   Pt has spent the majority of the day in his room.  Pt comes out for meals and medications.  Pt is calm  And cooperative.

## 2019-07-15 NOTE — Progress Notes (Signed)
   07/15/19 1950  COVID-19 Daily Checkoff  Have you had a fever (temp > 37.80C/100F)  in the past 24 hours?  No  COVID-19 EXPOSURE  Have you traveled outside the state in the past 14 days? No  Have you been in contact with someone with a confirmed diagnosis of COVID-19 or PUI in the past 14 days without wearing appropriate PPE? No  Have you been living in the same home as a person with confirmed diagnosis of COVID-19 or a PUI (household contact)? No  Have you been diagnosed with COVID-19? No

## 2019-07-15 NOTE — Progress Notes (Signed)
Pt. Did attend AA meeting in group room.

## 2019-07-15 NOTE — Tx Team (Signed)
Interdisciplinary Treatment and Diagnostic Plan Update  07/15/2019 Time of Session: 9:05am Dustin Tate MRN: [id number]  Principal Diagnosis: Brief psychotic disorder [facility])  Secondary Diagnoses: Principal Problem:   Brief psychotic disorder ([place]) Active Problems:   Suicidal ideations   Current Medications:  Current Facility-Administered Medications  Medication Dose Route Frequency Provider Last Rate Last Admin  . acetaminophen (TYLENOL) tablet 650 mg  650 mg Oral Q6H PRN Sharma Covert, MD      . alum & mag hydroxide-simeth (MAALOX/MYLANTA) 200-200-20 MG/5ML suspension 30 mL  30 mL Oral Q4H PRN Sharma Covert, MD      . feeding supplement (ENSURE ENLIVE) (ENSURE ENLIVE) liquid 237 mL  237 mL Oral BID BM Sharma Covert, MD   237 mL at 13/24/40 1027  . folic acid (FOLVITE) tablet 1 mg  1 mg Oral Daily Sharma Covert, MD   1 mg at 07/15/19 2536  . hydrOXYzine (ATARAX/VISTARIL) tablet 25 mg  25 mg Oral TID PRN Sharma Covert, MD      . OLANZapine zydis (ZYPREXA) disintegrating tablet 10 mg  10 mg Oral Q8H PRN Sharma Covert, MD       And  . LORazepam (ATIVAN) tablet 1 mg  1 mg Oral PRN Sharma Covert, MD       And  . ziprasidone (GEODON) injection 20 mg  20 mg Intramuscular PRN Sharma Covert, MD      . magnesium hydroxide (MILK OF MAGNESIA) suspension 30 mL  30 mL Oral Daily PRN Sharma Covert, MD      . multivitamin with minerals tablet 1 tablet  1 tablet Oral Daily Sharma Covert, MD   1 tablet at 07/15/19 [phone]  . ondansetron (ZOFRAN) tablet 8 mg  8 mg Oral Q8H PRN Sharma Covert, MD      . QUEtiapine (SEROQUEL) tablet 50 mg  50 mg Oral Daily Money, Lowry Ram, FNP   50 mg at 07/15/19 3474  . QUEtiapine (SEROQUEL) tablet 500 mg  500 mg Oral QHS Money, Lowry Ram, FNP   500 mg at 07/14/19 2115  . sertraline (ZOLOFT) tablet 50 mg  50 mg Oral Daily Sharma Covert, MD   50 mg at 07/15/19 2595   PTA Medications: No medications prior to  admission.    Patient Stressors:    Patient Strengths:    Treatment Modalities: Medication Management, Group therapy, Case management,  1 to 1 session with clinician, Psychoeducation, Recreational therapy.   Physician Treatment Plan for Primary Diagnosis: Brief psychotic disorder ([place]) Long Term Goal(s): Improvement in symptoms so as ready for discharge Improvement in symptoms so as ready for discharge   Short Term Goals: Ability to identify changes in lifestyle to reduce recurrence of condition will improve Ability to verbalize feelings will improve Ability to disclose and discuss suicidal ideas Ability to demonstrate self-control will improve Ability to identify and develop effective coping behaviors will improve Ability to maintain clinical measurements within normal limits will improve Ability to identify triggers associated with substance abuse/mental health issues will improve Ability to identify changes in lifestyle to reduce recurrence of condition will improve Ability to verbalize feelings will improve Ability to disclose and discuss suicidal ideas Ability to demonstrate self-control will improve Ability to identify and develop effective coping behaviors will improve Ability to maintain clinical measurements within normal limits will improve Ability to identify triggers associated with substance abuse/mental health issues will improve  Medication Management: Evaluate patient's response, side effects, and  tolerance of medication regimen.  Therapeutic Interventions: 1 to 1 sessions, Unit Group sessions and Medication administration.  Evaluation of Outcomes: Progressing  Physician Treatment Plan for Secondary Diagnosis: Principal Problem:   Brief psychotic disorder (HCC) Active Problems:   Suicidal ideations  Long Term Goal(s): Improvement in symptoms so as ready for discharge Improvement in symptoms so as ready for discharge   Short Term Goals: Ability to identify  changes in lifestyle to reduce recurrence of condition will improve Ability to verbalize feelings will improve Ability to disclose and discuss suicidal ideas Ability to demonstrate self-control will improve Ability to identify and develop effective coping behaviors will improve Ability to maintain clinical measurements within normal limits will improve Ability to identify triggers associated with substance abuse/mental health issues will improve Ability to identify changes in lifestyle to reduce recurrence of condition will improve Ability to verbalize feelings will improve Ability to disclose and discuss suicidal ideas Ability to demonstrate self-control will improve Ability to identify and develop effective coping behaviors will improve Ability to maintain clinical measurements within normal limits will improve Ability to identify triggers associated with substance abuse/mental health issues will improve     Medication Management: Evaluate patient's response, side effects, and tolerance of medication regimen.  Therapeutic Interventions: 1 to 1 sessions, Unit Group sessions and Medication administration.  Evaluation of Outcomes: Progressing   RN Treatment Plan for Primary Diagnosis: Brief psychotic disorder (HCC) Long Term Goal(s): Knowledge of disease and therapeutic regimen to maintain health will improve  Short Term Goals: Ability to remain free from injury will improve, Ability to verbalize feelings will improve, Ability to disclose and discuss suicidal ideas, Ability to identify and develop effective coping behaviors will improve and Compliance with prescribed medications will improve  Medication Management: RN will administer medications as ordered by provider, will assess and evaluate patient's response and provide education to patient for prescribed medication. RN will report any adverse and/or side effects to prescribing provider.  Therapeutic Interventions: 1 on 1 counseling  sessions, Psychoeducation, Medication administration, Evaluate responses to treatment, Monitor vital signs and CBGs as ordered, Perform/monitor CIWA, COWS, AIMS and Fall Risk screenings as ordered, Perform wound care treatments as ordered.  Evaluation of Outcomes: Progressing   LCSW Treatment Plan for Primary Diagnosis: Brief psychotic disorder [facility]) Long Term Goal(s): Safe transition to appropriate next level of care at discharge, Engage patient in therapeutic group addressing interpersonal concerns.  Short Term Goals: Engage patient in aftercare planning with referrals and resources, Increase social support, Increase ability to appropriately verbalize feelings, Identify triggers associated with mental health/substance abuse issues and Increase skills for wellness and recovery  Therapeutic Interventions: Assess for all discharge needs, 1 to 1 time with Social worker, Explore available resources and support systems, Assess for adequacy in community support network, Educate family and significant other(s) on suicide prevention, Complete Psychosocial Assessment, Interpersonal group therapy.  Evaluation of Outcomes: Progressing   Progress in Treatment: Attending groups: No. Participating in groups: No. Taking medication as prescribed: Yes. Toleration medication: Yes. Family/Significant other contact made: Yes, individual(s) contacted:  Mother. Patient understands diagnosis: Yes. Discussing patient identified problems/goals with staff: Yes. Medical problems stabilized or resolved: No. Denies suicidal/homicidal ideation: Yes. Issues/concerns per patient self-inventory: No. Other: none  New problem(s) identified: Yes, Describe:  currently declining follow up care. CSW will continue to assess.   New Short Term/Long Term Goal(s): detox, medication management for mood stabilization; elimination of SI thoughts; development of comprehensive mental wellness/sobriety plan  Patient Goals: "Be  sober. Detox."  Discharge Plan or Barriers: Patient will return home to live with mother. Has declined follow up care at this time.   Reason for Continuation of Hospitalization: Depression Medication stabilization Suicidal ideation  Estimated Length of Stay: 3-5 days   Attendees: Patient: Dustin Tate 07/15/2019  Physician: Landry Mellow, MD; Dr. Nehemiah Massed, MD 07/15/2019   Nursing:  07/15/2019   RN Care Manager: 07/15/2019   Social Worker: Enid Cutter, LCSWA; [place], [place] 07/15/2019   Recreational Therapist:  07/15/2019   Other:  07/15/2019   Other:  07/15/2019   Other: 07/15/2019     Scribe for Treatment Team: Maeola Sarah, LCSWAA 07/15/2019 1:34 PM

## 2019-07-15 NOTE — Progress Notes (Addendum)
[facility] MD Progress Note  07/15/2019 2:36 PM Dustin Tate  MRN:  [id number]    Subjective: Patient states he is feeling better, and feels he has made progress compared to admission.  He states he has spent more time in dayroom and has been socializing with some peers.  Currently denies suicidal ideations.  Denies medication side effects-he had reported feeling sedated , but states this has improved  Objective : I have discussed case with treatment team and met with patient 24 y old male, presented to hospital on 6/5 reporting suicidal ideations /thoughts of hanging self and auditory hallucinations, descried as demeaning /critical . States these had started a few  weeks ago Initially presented guarded/ limited historian. Endorses regular cannabis use. No prior history of psychiatric illness.  Today he reports he is feeling better.  He does present with improving grooming, improving eye contact, and affect is somewhat more reactive today.  Currently denies suicidal ideations. He denies auditory hallucinations today and currently does not appear to be internally preoccupied.  No delusions are currently expressed. Currently does not endorse medication side effects.  As noted, today does not complain of sedation and currently presents fully alert and attentive. With his expressed consent I attempted to contact his mother for collateral information.  No answer at this time. CSW spoke with mother yesterday and she corroborated that patient has been improving.   Principal Problem: Brief psychotic disorder ([place]) Diagnosis: Principal Problem:   Brief psychotic disorder ([place]) Active Problems:   Suicidal ideations  Total Time spent with patient: 20 minutes  Past Psychiatric History: No known past psychiatric history outside of a learning disability.  Patient denied any previous psychiatric hospitalizations or psychiatric medications.  Past Medical History:  Past Medical History:  Diagnosis Date  .  Depression   . 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 Sickle cell trait ([place])    History reviewed. No pertinent surgical history. Family History:  Family History  Problem Relation Age of Onset  . Healthy Mother   . Healthy Father    Family Psychiatric  History: None reported Social History:  Social History   Substance and Sexual Activity  Alcohol Use No     Social History   Substance and Sexual Activity  Drug Use Yes  . Frequency: 2.0 times per week  . Types: Marijuana   Comment: Advised to quit    Social History   Socioeconomic History  . Marital status: Single    Spouse name: Not on file  . Number of children: Not on file  . Years of education: Not on file  . Highest education level: Not on file  Occupational History  . 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 Smoking status: Current Some Day Smoker    Packs/day: 0.10    Years: 2.00    Pack years: 0.20    Types: Cigarettes    Start date: 2016  . Smokeless tobacco: Never Used  Vaping Use  . Vaping Use: Never assessed  Substance and Sexual Activity  . Alcohol use: No  . Drug use: Yes    Frequency: 2.0 times per week    Types: Marijuana    Comment: Advised to quit  . Sexual activity: Not Currently    Partners: Female    Birth control/protection: Condom  Other Topics Concern  . 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 Difficulty of Paying Living Expenses:   Food Insecurity:   . Worried About Charity fundraiser in the Last Year:   . Arboriculturist in the Last Year:   Transportation Needs:   . Film/video editor (Medical):   Marland Kitchen Lack of Transportation (Non-Medical):   Physical Activity:   . Days of Exercise per Week:   . Minutes of Exercise per Session:   Stress:   . Feeling of Stress :   Social Connections:   . Frequency of Communication with Friends  and Family:   . Frequency of Social Gatherings with Friends and Family:   . Attends Religious Services:   . Active Member of Clubs or Organizations:   . Attends Archivist Meetings:   Marland Kitchen Marital Status:    Additional Social History:   Sleep: Good  Appetite:  Good  Current Medications: Current Facility-Administered Medications  Medication Dose Route Frequency Provider Last Rate Last Admin  . acetaminophen (TYLENOL) tablet 650 mg  650 mg Oral Q6H PRN Sharma Covert, MD      . alum & mag hydroxide-simeth (MAALOX/MYLANTA) 200-200-20 MG/5ML suspension 30 mL  30 mL Oral Q4H PRN Sharma Covert, MD      . feeding supplement (ENSURE ENLIVE) (ENSURE ENLIVE) liquid 237 mL  237 mL Oral BID BM Sharma Covert, MD   237 mL at 09/38/18 2993  . folic acid (FOLVITE) tablet 1 mg  1 mg Oral Daily Sharma Covert, MD   1 mg at 07/15/19 7169  . hydrOXYzine (ATARAX/VISTARIL) tablet 25 mg  25 mg Oral TID PRN Sharma Covert, MD      . OLANZapine zydis (ZYPREXA) disintegrating tablet 10 mg  10 mg Oral Q8H PRN Sharma Covert, MD       And  . LORazepam (ATIVAN) tablet 1 mg  1 mg Oral PRN Sharma Covert, MD       And  . ziprasidone (GEODON) injection 20 mg  20 mg Intramuscular PRN Sharma Covert, MD      . magnesium hydroxide (MILK OF MAGNESIA) suspension 30 mL  30 mL Oral Daily PRN Sharma Covert, MD      . multivitamin with minerals tablet 1 tablet  1 tablet Oral Daily Sharma Covert, MD   1 tablet at 07/15/19 [phone]  . ondansetron (ZOFRAN) tablet 8 mg  8 mg Oral Q8H PRN Sharma Covert, MD      . QUEtiapine (SEROQUEL) tablet 50 mg  50 mg Oral Daily Money, Lowry Ram, FNP   50 mg at 07/15/19 3810  . QUEtiapine (SEROQUEL) tablet 500 mg  500 mg Oral QHS Money, Travis B, FNP   500 mg at 07/14/19 2115  . sertraline (ZOLOFT) tablet 50 mg  50 mg Oral Daily Sharma Covert, MD   50 mg at 07/15/19 1751    Lab Results: No results found for this or any previous visit  (from the past 48 hour(s)).  Blood Alcohol level:  Lab Results  Component Value Date   ETH <10 02/58/5277    Metabolic Disorder Labs: No results found for: HGBA1C, MPG No results found for: PROLACTIN No results found for: CHOL, TRIG, HDL, CHOLHDL, VLDL, LDLCALC  Physical Findings: AIMS:  , ,  ,  ,    CIWA:    COWS:     Musculoskeletal: Strength & Muscle Tone: within normal limits Gait &  Station: normal Patient leans: N/A  Psychiatric Specialty Exam: Physical Exam  Nursing note and vitals reviewed. Constitutional: He is oriented to person, place, and time. He appears well-developed.  Cardiovascular: Normal rate.  Respiratory: Effort normal.  Musculoskeletal:        General: Normal range of motion.  Neurological: He is alert and oriented to person, place, and time.  Skin: Skin is warm.    Review of Systems  Constitutional: Negative.   HENT: Negative.   Eyes: Negative.   Respiratory: Negative.   Cardiovascular: Negative.   Gastrointestinal: Negative.   Genitourinary: Negative.   Musculoskeletal: Negative.   Skin: Negative.   Neurological: Negative.   Psychiatric/Behavioral: Positive for hallucinations.  Denies chest pain, no shortness of breath, no nausea or vomiting  Blood pressure (!) 141/90, pulse 98, temperature 97.7 F (36.5 C), temperature source Oral, resp. rate 16, height _0  (1.6 m), weight 44.5 kg, SpO2 100 %.Body mass index is 17.36 kg/m.  General Appearance: Improving grooming  Eye Contact:  Improving eye contact  Speech:  Normal Rate  Volume:  Normal  Mood:  Reports he is feeling better  Affect:  Less blunted today, smiles appropriately at times during session  Thought Process:  Linear and Descriptions of Associations: Intact  Orientation:  Other:  Fully alert and attentive at this time  Thought Content:  Currently denies ongoing auditory hallucinations and does not appear internally preoccupied at this time.  No delusions are expressed  Suicidal  Thoughts:  No denies suicidal or self-injurious ideations  Homicidal Thoughts:  No  Memory:  Recent and remote grossly intact  Judgement:  Fair-improving  Insight:  Fair  Psychomotor Activity:  No psychomotor agitation or restlessness   Concentration:  Concentration: Improving and Attention Span: Improving  Recall:  Good  Fund of Knowledge:  Good  Language:  Good  Akathisia:  No-no akathisia noted at this time  Handed:  Right  AIMS (if indicated):     Assets:  Communication Skills Desire for Improvement Financial Resources/Insurance Housing Physical Health  ADL's:  Intact  Cognition:  WNL  Sleep:  Number of Hours: 6.75   Assessment:  44 y old male, presented to hospital on 6/5 reporting suicidal ideations /thoughts of hanging self and auditory hallucinations, descried as demeaning /critical . States these had started a few  weeks ago Initially presented guarded/ limited historian. Endorses regular cannabis use. No prior history of psychiatric illness.  Today patient presents with improving mood and is starting to exhibit a more reactive affect.  Grooming and eye contact are also noted to be improving.  He has been spending some time in dayroom and is interacting more with peers.  No current auditory hallucinations and currently does not appear internally preoccupied. Had reported sedation on Seroquel, this seems to be improving and currently presents fully alert and attentive.  Will adjust Seroquel to nightly dosing only. We have reviewed negative impact that cannabis can have on mental health and psychiatric symptoms, particularly in the context of psychotic presentation  Treatment Plan Summary: Daily contact with patient to assess and evaluate symptoms and progress in treatment and Medication management  Treatment plan reviewed as below today 6/11 Encourage group and milieu participation Treatment team working on disposition planning options Continue Seroquel 500 mg p.o. nightly  for psychotic features Continue Zoloft 50 mg p.o. daily for depression Continue Vistaril 25 mg p.o. 3 times daily for anxiety Continue agitation protocol for acute agitation if needed Check HgbA1C, Lipid Panel, Prolactin  Jenne Campus, MD 07/15/2019, 2:36 PM   Patient ID: Elenore Rota, male   DOB: 06/03/1995, 24 y.o.   MRN: [id number]

## 2019-07-15 NOTE — Progress Notes (Signed)
Pt said that his goal it to work on "detox" since he's been smoking tobacco and marijuana. He is minimal with interactions. He denies SI/HI and AVH. His blood pressure remains elevated and NP, Nira Conn notified. Pt remains asymptomatic, denies hx of HTN or taking any medication for it. Vitals will be rechecked in the AM. MHT reported that during bedtime pt was observed sitting on the edge of his bed as if he may be responding to some internal stimuli. Pt denied any hallucinations upon assessment. Pt said that he was just anxious about going home tomorrow and was offered his PRN vistaril, but refused. Pt said that he's not having trouble sleeping and will lay down soon. Pt verbally contracted for safety. Active listening, reassurance, and support provided. Medications administered as ordered by MD. Q 15 minute safety checks continue.    07/15/19 1950  Psych Admission Type (Psych Patients Only)  Admission Status Voluntary  Psychosocial Assessment  Patient Complaints None  Eye Contact Avoids  Facial Expression Flat  Affect Anxious;Appropriate to circumstance;Depressed  Speech Logical/coherent;Soft;Slow  Interaction Assertive;Minimal  Motor Activity Other (Comment) (WNL)  Appearance/Hygiene Unremarkable  Behavior Characteristics Cooperative;Appropriate to situation;Calm  Mood Pleasant  Thought Process  Coherency Blocking  Content WDL  Delusions None reported or observed  Perception WDL  Hallucination None reported or observed  Judgment Impaired  Confusion None  Danger to Self  Current suicidal ideation? Denies  Danger to Others  Danger to Others None reported or observed

## 2019-07-16 DIAGNOSIS — F23 Brief psychotic disorder: Secondary | ICD-10-CM | POA: Diagnosis not present

## 2019-07-16 LAB — LIPID PANEL
Cholesterol: 135 mg/dL (ref 0–200)
HDL: 48 mg/dL (ref >40)
LDL Cholesterol: 79 mg/dL (ref 0–99)
Total CHOL/HDL Ratio: 2.8 RATIO
Triglycerides: 42 mg/dL (ref <150)
VLDL: 8 mg/dL (ref 0–40)

## 2019-07-16 LAB — HEMOGLOBIN A1C
Hgb A1c MFr Bld: 6.1 % — ABNORMAL HIGH (ref 4.8–5.6)
Mean Plasma Glucose: 128.37 mg/dL

## 2019-07-16 NOTE — Progress Notes (Addendum)
D. Pt continues to be minimal with interactions- is calm and cooperative, compliant with medications. Pt does endorse some mild tremors, which may be related to medication. Pt observed in the dayroom attending group this am. Per pt's self inventory, pt rated his depression, hopelessness and anxiety a 0/5/5, respectively. Pt currently denies SI/HI and AVH  A. Labs and vitals monitored.Pt supported emotionally and encouraged to express concerns and ask questions.   R. Pt remains safe with 15 minute checks. Will continue POC.

## 2019-07-16 NOTE — [facility] Group Notes (Signed)
Adult Psychoeducational Group Note  Date:  07/16/2019 Time:  3:08 PM  Group Topic/Focus:  Goals Group:   The focus of this group is to help patients establish daily goals to achieve during treatment and discuss how the patient can incorporate goal setting into their daily lives to aide in recovery.  Participation Level:  Active  Participation Quality:  Appropriate  Affect:  Appropriate  Cognitive:  Appropriate  Insight: Good  Engagement in Group:  Engaged  Modes of Intervention:  Clarification, Education and Role-play  Additional Comments:  Pt stated his goal today would be to stay out in the milieu more and interact with his peers  Dione Housekeeper 07/16/2019, 3:08 PM

## 2019-07-16 NOTE — Progress Notes (Signed)
Adult Psychoeducational Group Note  Date:  07/16/2019 Time:  10:15 PM  Group Topic/Focus:  Wrap-Up Group:   The focus of this group is to help patients review their daily goal of treatment and discuss progress on daily workbooks.  Participation Level:  Active  Participation Quality:  Appropriate  Affect:  Appropriate  Cognitive:  Appropriate  Insight: Appropriate  Engagement in Group:  Engaged  Modes of Intervention:  Discussion  Additional Comments: *Patient attended the wrap up group of the 5 love languages and participated.    Jasson Siegmann W Latisa Belay 07/16/2019, 10:15 PM

## 2019-07-16 NOTE — [facility] Group Notes (Signed)
Adult Psychoeducational Group Note  Date:  07/16/2019 Time:  3:11 PM  Group Topic/Focus:  Identifying Needs:   The focus of this group is to help patients identify their personal needs that have been historically problematic and identify healthy behaviors to address their needs.  Participation Level:  Active  Participation Quality:  Appropriate  Affect:  Blunted and Flat  Cognitive:  Alert  Insight: Improving  Engagement in Group:  Engaged  Modes of Intervention:  Discussion, Education, Role-play and Support  Additional Comments:  Patient participates in the group and makes suggestions, as well as keeps very attentive  Dione Housekeeper 07/16/2019, 3:11 PM

## 2019-07-16 NOTE — Progress Notes (Signed)
Pt said that he attended group and that one thing he would like to work on is his "hope and moving forward." Positive reinforcement, encouragement, and support provided to pt. Pt seen in the dayroom pleasantly watching TV. He did c/o left lower abdominal pain radiating to his lower back, for which his PRN tylenol was administered. The tylenol was ineffective and a heating pack has been provided. Pt denies SI/HI and AVH. Medications administered as ordered by MD. Q 15 min safety checks continue.    07/16/19 1945  Psych Admission Type (Psych Patients Only)  Admission Status Voluntary  Psychosocial Assessment  Patient Complaints None  Eye Contact Avoids  Facial Expression Flat  Affect Blunted;Flat;Appropriate to circumstance  Speech Logical/coherent;Soft;Slow  Interaction Forwards little;Minimal  Motor Activity Other (Comment) (WNL)  Appearance/Hygiene Unremarkable  Behavior Characteristics Cooperative;Anxious  Mood Pleasant  Thought Process  Coherency WDL  Content WDL  Delusions None reported or observed  Perception WDL  Hallucination None reported or observed  Judgment Impaired  Confusion None  Danger to Self  Current suicidal ideation? Denies  Self-Injurious Behavior No self-injurious ideation or behavior indicators observed or expressed   Danger to Others  Danger to Others None reported or observed

## 2019-07-16 NOTE — [facility] Group Notes (Signed)
LCSW Group Therapy Note  07/16/2019   10:00-11:00am   Type of Therapy and Topic:  Group Therapy: Anger Cues and Responses  Participation Level:  Did Not Attend   Description of Group:   In this group, patients learned how to recognize the physical, cognitive, emotional, and behavioral responses they have to anger-provoking situations.  They identified a recent time they became angry and how they reacted.  They analyzed how their reaction was possibly beneficial and how it was possibly unhelpful.  The group discussed a variety of healthier coping skills that could help with such a situation in the future.  Focus was placed on how helpful it is to recognize the underlying emotions to our anger, because working on those can lead to a more permanent solution as well as our ability to focus on the important rather than the urgent.  Therapeutic Goals: 1. Patients will remember their last incident of anger and how they felt emotionally and physically, what their thoughts were at the time, and how they behaved. 2. Patients will identify how their behavior at that time worked for them, as well as how it worked against them. 3. Patients will explore possible new behaviors to use in future anger situations. 4. Patients will learn that anger itself is normal and cannot be eliminated, and that healthier reactions can assist with resolving conflict rather than worsening situations.  Summary of Patient Progress:  The patient was invited to group but did not attend  Therapeutic Modalities:   Cognitive Behavioral Therapy  Alann Avey J Grossman-Orr    

## 2019-07-16 NOTE — Progress Notes (Addendum)
[facility] MD Progress Note  07/16/2019 11:40 AM Dustin Tate  MRN:  [id number]    Subjective: Continues to report improvement.  He reports his mood as "maybe 70% better".  He denies hallucinations at this time.  He reports improving medication tolerance and no longer feels less sedated or tired. Objective : I have discussed case with treatment team and met with patient 24 y old male, presented to hospital on 6/5 reporting suicidal ideations /thoughts of hanging self and auditory hallucinations, descried as demeaning /critical . States these had started a few  weeks ago Initially presented guarded/ limited historian. Endorses regular cannabis use. No prior history of psychiatric illness.  As above patient reports improvement compared to admission.  He presents calm and polite on approach.  He answers questions with monosyllables or short phrases and does not elaborate much.  He does state he feels "70% better" and describes improving mood/denies hallucinations.  He does not appear internally preoccupied.  No delusions are expressed. No disruptive or agitated behaviors on unit/limited milieu participation.  With his expressed consent I spoke with his mother via phone.  She acknowledges improvement compared to admission. Labs reviewed-lipid panel unremarkable, hemoglobin A1c  6.1.   Principal Problem: Brief psychotic disorder ([place]) Diagnosis: Principal Problem:   Brief psychotic disorder ([employer]) Active Problems:   Suicidal ideations  Total Time spent with patient: 20 minutes  Past Psychiatric History: No known past psychiatric history outside of a learning disability.  Patient denied any previous psychiatric hospitalizations or psychiatric medications.  Past Medical History:  Past Medical History:  Diagnosis Date  . Depression   . 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 Sickle cell trait ([place])    History reviewed. No pertinent surgical history. Family History:  Family History  Problem Relation Age of  Onset  . Healthy Mother   . Healthy Father    Family Psychiatric  History: None reported Social History:  Social History   Substance and Sexual Activity  Alcohol Use No     Social History   Substance and Sexual Activity  Drug Use Yes  . Frequency: 2.0 times per week  . Types: Marijuana   Comment: Advised to quit    Social History   Socioeconomic History  . Marital status: Single    Spouse name: Not on file  . Number of children: Not on file  . Years of education: Not on file  . Highest education level: Not on file  Occupational History  . 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 Smoking status: Current Some Day Smoker    Packs/day: 0.10    Years: 2.00    Pack years: 0.20    Types: Cigarettes    Start date: 2016  . Smokeless tobacco: Never Used  Vaping Use  . Vaping Use: Never assessed  Substance and Sexual Activity  . Alcohol use: No  . Drug use: Yes    Frequency: 2.0 times per week    Types: Marijuana    Comment: Advised to quit  . Sexual activity: Not Currently    Partners: Female    Birth control/protection: Condom  Other Topics Concern  . 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 Difficulty of Paying Living Expenses:   Food Insecurity:   . Worried About Charity fundraiser in  the Last Year:   . [place] in the Last Year:   Transportation Needs:   . Film/video editor (Medical):   Marland Kitchen Lack of Transportation (Non-Medical):   Physical Activity:   . Days of Exercise per Week:   . Minutes of Exercise per Session:   Stress:   . Feeling of Stress :   Social Connections:   . Frequency of Communication with Friends and Family:   . Frequency of Social Gatherings with Friends and Family:   . Attends Religious Services:   . Active Member of Clubs or Organizations:   . Attends Theatre manager Meetings:   Marland Kitchen Marital Status:    Additional Social History:   Sleep: Good  Appetite:  Good  Current Medications: Current Facility-Administered Medications  Medication Dose Route Frequency Provider Last Rate Last Admin  . acetaminophen (TYLENOL) tablet 650 mg  650 mg Oral Q6H PRN Sharma Covert, MD      . alum & mag hydroxide-simeth (MAALOX/MYLANTA) 200-200-20 MG/5ML suspension 30 mL  30 mL Oral Q4H PRN Sharma Covert, MD      . feeding supplement (ENSURE ENLIVE) (ENSURE ENLIVE) liquid 237 mL  237 mL Oral BID BM Sharma Covert, MD   237 mL at 07/16/19 0831  . folic acid (FOLVITE) tablet 1 mg  1 mg Oral Daily Sharma Covert, MD   1 mg at 07/16/19 0830  . hydrOXYzine (ATARAX/VISTARIL) tablet 25 mg  25 mg Oral TID PRN Sharma Covert, MD      . OLANZapine zydis (ZYPREXA) disintegrating tablet 10 mg  10 mg Oral Q8H PRN Sharma Covert, MD       And  . LORazepam (ATIVAN) tablet 1 mg  1 mg Oral PRN Sharma Covert, MD       And  . ziprasidone (GEODON) injection 20 mg  20 mg Intramuscular PRN Sharma Covert, MD      . magnesium hydroxide (MILK OF MAGNESIA) suspension 30 mL  30 mL Oral Daily PRN Sharma Covert, MD      . multivitamin with minerals tablet 1 tablet  1 tablet Oral Daily Sharma Covert, MD   1 tablet at 07/16/19 0829  . ondansetron (ZOFRAN) tablet 8 mg  8 mg Oral Q8H PRN Sharma Covert, MD      . QUEtiapine (SEROQUEL) tablet 500 mg  500 mg Oral QHS Money, Lowry Ram, FNP   500 mg at 07/15/19 2101  . sertraline (ZOLOFT) tablet 50 mg  50 mg Oral Daily Sharma Covert, MD   50 mg at 07/16/19 0830    Lab Results:  Results for orders placed or performed during the hospital encounter of 07/08/19 (from the past 48 hour(s))  Lipid panel     Status: None   Collection Time: 07/16/19  6:42 AM  Result Value Ref Range   Cholesterol 135 0 - 200 mg/dL   Triglycerides 42 <150 mg/dL   HDL 48 >40 mg/dL   Total CHOL/HDL Ratio 2.8 RATIO    VLDL 8 0 - 40 mg/dL   LDL Cholesterol 79 0 - 99 mg/dL    Comment:        Total Cholesterol/HDL:CHD Risk Coronary Heart Disease Risk Table                     Men   Women  1/2 Average Risk   3.4   3.3  Average Risk  5.0   4.4  2 X Average Risk   9.6   7.1  3 X Average Risk  23.4   11.0        Use the calculated Patient Ratio above and the CHD Risk Table to determine the patient's CHD Risk.        ATP III CLASSIFICATION (LDL):  <100     mg/dL   Optimal  100-129  mg/dL   Near or Above                    Optimal  130-159  mg/dL   Borderline  160-189  mg/dL   High  >190     mg/dL   Very High Performed at [facility] [address] [place], [place] [address]   Hemoglobin A1c     Status: Abnormal   Collection Time: 07/16/19  6:42 AM  Result Value Ref Range   Hgb A1c MFr Bld 6.1 (H) 4.8 - 5.6 %    Comment: (NOTE) Pre diabetes:          5.7%-6.4%  Diabetes:              >6.4%  Glycemic control for   <7.0% adults with diabetes    Mean Plasma Glucose 128.37 mg/dL    Comment: Performed at [facility] [address], [place], [place] [address]    Blood Alcohol level:  Lab Results  Component Value Date   ETH <10 18/56/3149    Metabolic Disorder Labs: Lab Results  Component Value Date   HGBA1C 6.1 (H) 07/16/2019   MPG 128.37 07/16/2019   No results found for: PROLACTIN Lab Results  Component Value Date   CHOL 135 07/16/2019   TRIG 42 07/16/2019   HDL 48 07/16/2019   CHOLHDL 2.8 07/16/2019   VLDL 8 07/16/2019   LDLCALC 79 07/16/2019    Physical Findings: AIMS:  , ,  ,  ,    CIWA:    COWS:     Musculoskeletal: Strength & Muscle Tone: within normal limits Gait & Station: normal Patient leans: N/A  Psychiatric Specialty Exam: Physical Exam  Nursing note and vitals reviewed. Constitutional: He is oriented to person, place, and time. He appears well-developed.  Cardiovascular: Normal rate.  Respiratory: Effort normal.   Musculoskeletal:        General: Normal range of motion.  Neurological: He is alert and oriented to person, place, and time.  Skin: Skin is warm.    Review of Systems  Constitutional: Negative.   HENT: Negative.   Eyes: Negative.   Respiratory: Negative.   Cardiovascular: Negative.   Gastrointestinal: Negative.   Genitourinary: Negative.   Musculoskeletal: Negative.   Skin: Negative.   Neurological: Negative.   Psychiatric/Behavioral: Positive for hallucinations.  Denies chest pain, no shortness of breath, no nausea or vomiting  Blood pressure 137/83, pulse (!) 106, temperature 98.1 F (36.7 C), temperature source Oral, resp. rate 16, height _0  (1.6 m), weight 44.5 kg, SpO2 97 %.Body mass index is 17.36 kg/m.  General Appearance: Improving grooming  Eye Contact:  Improving eye contact  Speech:  Normal Rate  Volume:  Normal  Mood:  Describes improvement, states feeling "70% better"  Affect:  Slightly blunted, improves partially during session, smiles briefly at times.  Thought Process:  Linear and Descriptions of Associations: Intact  Orientation:  Other:  Fully alert and attentive at this time  Thought Content:  Does not endorse hallucinations at present  and does not appear internally preoccupied.  No delusions are expressed.  Suicidal Thoughts:  No denies suicidal or self-injurious ideations  Homicidal Thoughts:  No  Memory:  Recent and remote grossly intact  Judgement:  Fair-improving  Insight:  Fair  Psychomotor Activity:  Decreased   Concentration:  Concentration: Improving and Attention Span: Improving  Recall:  Good  Fund of Knowledge:  Good  Language:  Good  Akathisia:  No-no akathisia noted at this time  Handed:  Right  AIMS (if indicated):     Assets:  Communication Skills Desire for Improvement Financial Resources/Insurance Housing Physical Health  ADL's:  Intact  Cognition:  WNL  Sleep:  Number of Hours: 6   Assessment:  64 y old male, presented to  hospital on 6/5 reporting suicidal ideations /thoughts of hanging self and auditory hallucinations, descried as demeaning /critical . States these had started a few  weeks ago Initially presented guarded/ limited historian. Endorses regular cannabis use. No prior history of psychiatric illness.  Patient is reporting improving mood, and currently denies suicidal ideations.  He does not endorse psychotic symptoms at this time and does not appear internally preoccupied at present.  Affect has tended to improve but is still vaguely blunted.  Mother corroborates improvement compared to admission.  Tolerating Seroquel well and no longer feeling as sedated/presents fully alert and attentive.  Of note hemoglobin A1c 6.1. His weight today is 103 lbs, reports height as 63 inches: BMI 18.2  *Seroquel is a new medication trial for patient and was started on this admission- as per chart notes had been on Zyprexa for a brief period of time prior to admission, which could have been a contributor to hgbA1c  - have reviewed potential for Seroquel to contribute to metabolic disorders to include hyperglycemia and weight gain. Currently he  does not  endorse significant increase in appetite or weight gain on Seroquel thus far. Discussed options, including  change to another antipsychotic less likely to be associated with weight gain/metabolic side effects. At this time he prefers to continue Seroquel.  Treatment Plan Summary: Daily contact with patient to assess and evaluate symptoms and progress in treatment and Medication management  Treatment plan reviewed as below today 6/12 Encourage group and milieu participation Treatment team working on disposition planning options Continue Seroquel 500 mg p.o. nightly for psychosis Continue Zoloft 50 mg p.o. daily for depression Continue Vistaril 25 mg p.o. 3 times daily for anxiety Continue agitation protocol for acute agitation if needed   Jenne Campus, MD 07/16/2019,  11:40 AM   Patient ID: Dustin Tate, male   DOB: 1995/04/22, 24 y.o.   MRN: [id number]

## 2019-07-16 NOTE — Progress Notes (Signed)
   07/16/19 1945  COVID-19 Daily Checkoff  Have you had a fever (temp > 37.80C/100F)  in the past 24 hours?  No  COVID-19 EXPOSURE  Have you traveled outside the state in the past 14 days? No  Have you been in contact with someone with a confirmed diagnosis of COVID-19 or PUI in the past 14 days without wearing appropriate PPE? No  Have you been living in the same home as a person with confirmed diagnosis of COVID-19 or a PUI (household contact)? No  Have you been diagnosed with COVID-19? No

## 2019-07-17 DIAGNOSIS — F23 Brief psychotic disorder: Secondary | ICD-10-CM | POA: Diagnosis not present

## 2019-07-17 LAB — GLUCOSE, CAPILLARY: Glucose-Capillary: 77 mg/dL (ref 70–99)

## 2019-07-17 LAB — PROLACTIN: Prolactin: 8.6 ng/mL (ref 4.0–15.2)

## 2019-07-17 MED ORDER — QUETIAPINE FUMARATE 100 MG PO TABS: 500.0000 mg | ORAL_TABLET | Freq: Every day | ORAL | 0 refills | Status: DC

## 2019-07-17 MED ORDER — HYDROXYZINE HCL 25 MG PO TABS: 25.0000 mg | ORAL_TABLET | Freq: Three times a day (TID) | ORAL | 0 refills | Status: DC | PRN

## 2019-07-17 MED ORDER — SERTRALINE HCL 50 MG PO TABS: 50.0000 mg | ORAL_TABLET | Freq: Every day | ORAL | 0 refills | Status: DC

## 2019-07-17 NOTE — [facility] Group Notes (Signed)
[facility] LCSW Group Therapy Note  07/17/2019    Type of Therapy and Topic:  Group Therapy:  Adding Supports Including Yourself  Participation Level:  Active   Description of Group:   Patients in this group were introduced to the concept that additional supports including self-support are an essential part of recovery.  Patients listed what supports they believe they need to add to their lives to achieve their goals at discharge, and they listed such things as therapist, family, doctor, support groups, 12-step groups and service animals.   A song entitled "My Own Hero" was played and a group discussion ensued in which patients stated they could relate to the song and it inspired them to realize they have be willing to help themselves in order to succeed, because other people cannot achieve sobriety or stability for them.  "Fight For It" was played, then "Tell Your Heart To Beat Again" to encourage patients.  They discussed the impact on them and how they must remain convinced that their lives are worth the effort it takes to become sober and/or stable.  Therapeutic Goals: 1)  demonstrate the importance of being a key part of one's own support system 2)  discuss various available supports 3)  encourage patient to use music as part of their self-support and focus on goals 4)  elicit ideas from patients about supports that need to be added   Summary of Patient Progress:  The patient expressed that his mother and brothers are good, healthy supports while his father is "sometimes" a good support.  He talked about needing a job, and was able through feedback from the group to admit that he himself is often an unhealthy support for himself.   Therapeutic Modalities:   Motivational Interviewing Activity  Carloyn Jaeger Grossman-Orr  2:54 PM

## 2019-07-17 NOTE — Discharge Summary (Addendum)
Physician Discharge Summary Note  Patient:  Dustin Tate is an 24 y.o., male MRN:  [id number] DOB:  Jun 27, 1995 Patient phone:  [phone] (home)  Patient address:   [address] [place] [address],  Total Time spent with patient: 30 minutes  Date of Admission:  07/08/2019 Date of Discharge: 07/17/19  Reason for Admission:  24 year old male who presented to the [facility] emergency department on 07/07/2019 under the supervision of his mother. The patient continues to be not a great historian. Apparently the patient had attempted self-harm by putting a rope around his neck the day prior to admission. He at least acknowledged the fact that that had taken place. This a.m. he also admitted to auditory hallucinations. According to the notes the voices telling him that he is hopeless and worthless. He stated in the emergency department that he did not want to live any longer.   Principal Problem: Brief psychotic disorder [facility]) Discharge Diagnoses: Principal Problem:   Brief psychotic disorder ([facility]) Active Problems:   Suicidal ideations   Past Psychiatric History: No known past psychiatric history outside of a learning disability.  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 No pertinent surgical history. Family History:  Family History  Problem Relation Age of Onset   Healthy Mother    Healthy Father    Family Psychiatric  History: None reported Social History:  Social History   Substance and Sexual Activity  Alcohol Use No     Social History   Substance and Sexual Activity  Drug Use Yes   Frequency: 2.0 times per week   Types: Marijuana   Comment: Advised to quit    Social History   Socioeconomic History   Marital status: Single    Spouse  name: Not on file   Number of children: Not on file   Years of education: Not on file   Highest education level: Not on file  Occupational History   Occupation: Health and safety inspector: [employer]    Comment: Studies Primary school teacher  Tobacco Use   Smoking status: Current Some Day Smoker    Packs/day: 0.10    Years: 2.00    Pack years: 0.20    Types: Cigarettes    Start date: 2016   Smokeless tobacco: Never Used  Building services engineer Use: Never assessed  Substance and Sexual Activity   Alcohol use: No   Drug use: Yes    Frequency: 2.0 times per week    Types: Marijuana    Comment: Advised to quit   Sexual activity: Not Currently    Partners: Female    Birth control/protection: Condom  Other Topics Concern   Not on file  Social History Scientist, 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   Social Determinants of Health   Financial Resource Strain:    Difficulty of Paying Living Expenses:   Food Insecurity:    Worried About Programme researcher, broadcasting/film/video in the Last Year:    Barista in the Last Year:   Transportation Needs:    Freight forwarder (Medical):    Lack of Transportation (Non-Medical):   Physical Activity:    Days of Exercise per Week:    Minutes of Exercise per Session:   Stress:  Feeling of Stress :   Social Connections:    Frequency of Communication with Friends and Family:    Frequency of Social Gatherings with Friends and Family:    Attends Religious Services:    Active Member of Clubs or Organizations:    Attends Archivist Meetings:    Marital Status:     Hospital Course:  Patient remained on the [facility] unit for 8 days. The patient stabilized on medication and therapy. Patient was discharged on Seroquel 500 mg QHS and Zoloft 50 mg Daily. Patient has shown improvement with improved mood, affect, sleep, appetite, and interaction. Patient has attended group and participated. Patient has been seen in the day room interacting  with peers and staff appropriately. Patient denies any SI/HI/AVH and contracts for safety. Patient agrees to follow up at [facility] and [facility]. Patient is provided with prescriptions for their medications upon discharge.  Physical Findings: AIMS:  , ,  ,  ,    CIWA:    COWS:     Musculoskeletal: Strength & Muscle Tone: within normal limits Gait & Station: normal Patient leans: N/A  Psychiatric Specialty Exam: Physical Exam  Nursing note and vitals reviewed. Constitutional: He is oriented to person, place, and time. He appears well-developed.  Cardiovascular: Normal rate.  Respiratory: Effort normal.  Musculoskeletal:        General: Normal range of motion.  Neurological: He is alert and oriented to person, place, and time.  Skin: Skin is warm.    Review of Systems  Constitutional: Negative.   HENT: Negative.   Eyes: Negative.   Respiratory: Negative.   Cardiovascular: Negative.   Gastrointestinal: Negative.   Genitourinary: Negative.   Musculoskeletal: Negative.   Skin: Negative.   Neurological: Negative.     Blood pressure 137/87, pulse 80, temperature 97.7 F (36.5 C), temperature source Oral, resp. rate 16, height 5\' 3"  (1.6 m), weight 44.5 kg, SpO2 100 %.Body mass index is 17.36 kg/m.   General Appearance: Casual  Eye Contact::  Improving  Speech:  Normal Rate409  Volume:  Normal  Mood:  Reports mood is "okay"  Affect:  Becoming more reactive, smiles appropriately at times during session  Thought Process:  Linear and Descriptions of Associations: Intact  Orientation:  Other:  Fully alert and attentive  Thought Content:  Denies hallucinations and does not appear internally preoccupied. No delusions are expressed.  Suicidal Thoughts:  No denies suicidal or self-injurious ideations  Homicidal Thoughts:  No denies any homicidal or violent ideations  Memory:  Recent and remote grossly intact  Judgement:   Other:  Improving  Insight:  Fair/improving  Psychomotor Activity:  Normal-no psychomotor agitation or restlessness  Concentration:  Good  Recall:  Good  Fund of Knowledge:Good  Language: Good  Akathisia:  Negative  Handed:  Right  AIMS (if indicated):     Assets:  Communication Skills Desire for Improvement Resilience  Sleep:  Number of Hours: 6.75  Cognition: WNL  ADL's:  Intact      Has this patient used any form of tobacco in the last 30 days? (Cigarettes, Smokeless Tobacco, Cigars, and/or Pipes) Yes, No  Blood Alcohol level:  Lab Results  Component Value Date   ETH <10 44/31/5400    Metabolic Disorder Labs:  Lab Results  Component Value Date   HGBA1C 6.1 (H) 07/16/2019   MPG 128.37 07/16/2019   No results found for: PROLACTIN Lab Results  Component Value Date   CHOL  135 07/16/2019   TRIG 42 07/16/2019   HDL 48 07/16/2019   CHOLHDL 2.8 07/16/2019   VLDL 8 07/16/2019   LDLCALC 79 07/16/2019    See Psychiatric Specialty Exam and Suicide Risk Assessment completed by Attending Physician prior to discharge.  Discharge destination:  Home  Is patient on multiple antipsychotic therapies at discharge:  No   Has Patient had three or more failed trials of antipsychotic monotherapy by history:  No  Recommended Plan for Multiple Antipsychotic Therapies: NA   Allergies as of 07/17/2019   No Known Allergies     Medication List    TAKE these medications     Indication  hydrOXYzine 25 MG tablet Commonly known as: ATARAX/VISTARIL Take 1 tablet (25 mg total) by mouth 3 (three) times daily as needed for anxiety.  Indication: Feeling Anxious   QUEtiapine 100 MG tablet Commonly known as: SEROQUEL Take 5 tablets (500 mg total) by mouth at bedtime.  Indication: psychosis   sertraline 50 MG tablet Commonly known as: ZOLOFT Take 1 tablet (50 mg total) by mouth daily. Start taking on: July 18, 2019  Indication: Major Depressive Disorder       Follow-up  Information    [facility] Follow up.   Specialty: Behavioral Health Why: medication management and therapy services Contact information: [address] [place] [address] [phone]        [facility]. Go on 08/11/2019.   Specialty: Behavioral Health Why: 08/11/19 at 1:00 for mm, and 08/11/19 at 2 pm for therapy, arrive at 12:45 pm Contact information: [address] [place] [address] [phone]              Follow-up recommendations:  Continue activity as tolerated. Continue diet as recommended by your PCP. Ensure to keep all appointments with outpatient providers.  Comments:  Patient is instructed prior to discharge to: Take all medications as prescribed by his/her mental healthcare provider. Report any adverse effects and or reactions from the medicines to his/her outpatient provider promptly. Patient has been instructed & cautioned: To not engage in alcohol and or illegal drug use while on prescription medicines. In the event of worsening symptoms, patient is instructed to call the crisis hotline, 911 and or go to the nearest ED for appropriate evaluation and treatment of symptoms. To follow-up with his/her primary care provider for your other medical issues, concerns and or health care needs.    Signed: Gerlene Burdock Money, FNP 07/17/2019, 9:53 AM   Patient seen, Suicide Assessment Completed.  Disposition Plan Reviewed

## 2019-07-17 NOTE — Progress Notes (Signed)
Pt discharged to lobby- mother present to pick up patient. Pt was stable and appreciative at that time. All papers and prescriptions were given and valuables returned. Verbal understanding expressed. Denies SI/HI and A/VH. Pt given opportunity to express concerns and ask questions.

## 2019-07-17 NOTE — Progress Notes (Signed)
   07/17/19 0800  Psych Admission Type (Psych Patients Only)  Admission Status Voluntary  Psychosocial Assessment  Patient Complaints None  Eye Contact Brief  Facial Expression Flat  Affect Blunted;Flat;Appropriate to circumstance  Speech Logical/coherent;Soft;Slow  Interaction Forwards little;Minimal  Motor Activity Other (Comment) (WNL)  Appearance/Hygiene Unremarkable  Behavior Characteristics Cooperative  Mood Pleasant  Thought Process  Coherency WDL  Content WDL  Delusions None reported or observed  Perception WDL  Hallucination None reported or observed  Judgment Impaired  Confusion None  Danger to Self  Current suicidal ideation? Denies  Self-Injurious Behavior No self-injurious ideation or behavior indicators observed or expressed   Agreement Not to Harm Self Yes  Description of Agreement verbal contract for safety  Danger to Others  Danger to Others None reported or observed

## 2019-07-17 NOTE — Progress Notes (Signed)
Adult Psychoeducational Group Note  Date:  07/17/2019  Time:  12:21 PM  Group Topic/Focus:  Goals Group:   The focus of this group is to help patients establish daily goals to achieve during treatment and discuss how the patient can incorporate goal setting into their daily lives to aide in recovery.  Participation Level:  Active  Participation Quality:  Appropriate  Affect:  Appropriate  Cognitive:  Alert  Insight: Appropriate  Engagement in Group:  Engaged  Modes of Intervention:  Discussion  Additional Comments:  Pt attended group and participated in discussion.  Kandon Hosking R Cantrell Larouche 07/17/2019, 12:21 PM

## 2019-07-17 NOTE — Progress Notes (Signed)
  [facility] Adult Case Management Discharge Plan :  Will you be returning to the same living situation after discharge:  Yes,  with family At discharge, do you have transportation home?: Yes,  mother Do you have the ability to pay for your medications: Yes,  insurance  Release of information consent forms completed and emailed to Medical Records, then turned in to Medical Records by CSW.   Patient to Follow up at:  Follow-up Information    [facility] Follow up.   Specialty: Behavioral Health Why: Medication management and therapy services are available at this agency.  You would need to call them directly and give them a $150 deposit prior to first appointment. Contact information: [address] [place] [address] [phone]        [facility]. Go on 08/11/2019.   Specialty: Behavioral Health Why: 08/11/19 at 1:00pm for medication management, and 08/11/19 at 2pm for therapy, arrive at 12:45pm please. Contact information: [address] [place] [address] [phone]              Next level of care provider has access to [facility] Link:no  Safety Planning and Suicide Prevention discussed: Yes,  with mother     Has patient been referred to the Quitline?: N/A patient is not a smoker  Patient has been referred for addiction treatment: Yes  Lynnell Chad, LCSW 07/17/2019, 10:03 AM

## 2019-07-17 NOTE — BHH Group Notes (Signed)
Adult Psychoeducational Group Note  Date:  07/17/2019  Time:  9:00am-9:45am  Group Topic/Focus: PROGRESSIVE RELAXATION. A group where deep breathing is taught and tensing and relaxation muscle groups is used. Imagery is used as well.  Pts are asked to imagine 3 pillars that hold them up when they are not able to hold themselves up.  Participation Level:  Active  Participation Quality:  Appropriate  Affect:  Flat  Cognitive:  Appropriate  Insight: Lacking  Engagement in Group:  Engaged  Modes of Intervention:  Activity, Discussion, Education, and Support  Additional Comments:  Rates his energy level a 8/10. States his 3 pillars are: Life, his family and his motivation. States he has learned not to be scared of anybody.  Vira Blanco A 12:43 PM

## 2019-07-17 NOTE — BHH Suicide Risk Assessment (Addendum)
[facility] Discharge Suicide Risk Assessment   Principal Problem: Brief psychotic disorder [facility]) Discharge Diagnoses: Principal Problem:   Brief psychotic disorder ([place]) Active Problems:   Suicidal ideations   Total Time spent with patient: 30 minutes  Musculoskeletal: Strength & Muscle Tone: within normal limits Gait & Station: normal Patient leans: N/A  Psychiatric Specialty Exam: Review of Systems denies headache, no chest pain, no shortness of breath, no nausea, no vomiting  Blood pressure 137/87, pulse 80, temperature 97.7 F (36.5 C), temperature source Oral, resp. rate 16, height 5\' 3"  (1.6 m), weight 44.5 kg, SpO2 100 %.Body mass index is 17.36 kg/m.  General Appearance: Casual  Eye Contact::  Improving  Speech:  Normal Rate409  Volume:  Normal  Mood:  Reports mood is "okay"  Affect:  Becoming more reactive, smiles appropriately at times during session  Thought Process:  Linear and Descriptions of Associations: Intact  Orientation:  Other:  Fully alert and attentive  Thought Content:  Denies hallucinations and does not appear internally preoccupied. No delusions are expressed.  Suicidal Thoughts:  No denies suicidal or self-injurious ideations  Homicidal Thoughts:  No denies any homicidal or violent ideations  Memory:  Recent and remote grossly intact  Judgement:  Other:  Improving  Insight:  Fair/improving  Psychomotor Activity:  Normal-no psychomotor agitation or restlessness  Concentration:  Good  Recall:  Good  Fund of Knowledge:Good  Language: Good  Akathisia:  Negative  Handed:  Right  AIMS (if indicated):     Assets:  Communication Skills Desire for Improvement Resilience  Sleep:  Number of Hours: 6.75  Cognition: WNL  ADL's:  Intact   Mental Status Per Nursing Assessment::   On Admission:     Demographic Factors:  24-year-old male, lives with mother  Loss Factors: Does not identify any specific triggers or stressors  Historical Factors: History of  learning disability, no prior psychiatric admissions. History of cannabis use.  Risk Reduction Factors:   Sense of responsibility to family, Living with another person, especially a relative and Positive coping skills or problem solving skills  Continued Clinical Symptoms:  Today patient presents alert, attentive, without psychomotor agitation or restlessness, cooperative on approach. Describes mood as improved and does not feel depressed at this time affect is more reactive, smiles at times appropriately during session. Denies hallucinations, does not appear internally preoccupied at this time. No delusions are currently expressed. Behavior on unit is calm and in good control, no disruptive or agitated behaviors. He has been more active in milieu/visible in dayroom. With his expressed consent I spoke with his mother who corroborates that patient has improved since admission. She is in agreement with discharge. He denies medication side effects ( On Seroquel/Zoloft, which are both new medications for patient ) . He is no longer reporting sedation and appears fully alert/attentive at this time. Side effects have been reviewed, to include risk of sedation/weight gain/hyperglycemia/metabolic disorder/motor disorders associated with Seroquel, and potential risk of sexual side effects on Zoloft. We also reviewed the potential for increased suicidal ideations early in treatment with antidepressants in young adults. Have reviewed potential negative impact that cannabis could have on psychiatric symptoms, encouraged abstinence.  Of note, hemoglobin  A1c 6.1. CBG done today prior to discharge (about 1/2 hours after breakfast)- 77.  Weight 103 lbs. BMI 18.04 reported 5 foot 3 inches height. Patient aware of the importance of following up with outpatient provider/PCP for further monitoring.   Cognitive Features That Contribute To Risk:  No gross cognitive  deficits noted upon discharge. Is alert , attentive, and  oriented x 3    Suicide Risk:  Mild:  Suicidal ideation of limited frequency, intensity, duration, and specificity.  There are no identifiable plans, no associated intent, mild dysphoria and related symptoms, good self-control (both objective and subjective assessment), few other risk factors, and identifiable protective factors, including available and accessible social support.   Follow-up Information    [facility] Follow up.   Specialty: Behavioral Health Why: medication management and therapy services Contact information: [address] [place] [address] [phone]        [facility]. Go on 08/11/2019.   Specialty: Behavioral Health Why: 08/11/19 at 1:00 for mm, and 08/11/19 at 2 pm for therapy, arrive at 12:45 pm Contact information: [address] [place] [address] [phone]              Plan Of Care/Follow-up recommendations:  Activity:  As tolerated Diet:  Regular Tests:  NA Other:  See below  Patient is expressing readiness for discharge. Plans to return home. His mother will be picking him up later today. Follow-up as above.  Craige Cotta, MD 07/17/2019, 10:03 AM

## 2019-07-22 ENCOUNTER — Telehealth (HOSPITAL_COMMUNITY): Payer: Medicaid Other | Admitting: Psychiatric/Mental Health

## 2019-07-22 ENCOUNTER — Other Ambulatory Visit: Payer: Self-pay

## 2019-07-28 ENCOUNTER — Ambulatory Visit (INDEPENDENT_AMBULATORY_CARE_PROVIDER_SITE_OTHER): Payer: BC Managed Care – PPO | Admitting: Psychiatry

## 2019-07-28 ENCOUNTER — Other Ambulatory Visit: Payer: Self-pay

## 2019-07-28 ENCOUNTER — Encounter (HOSPITAL_COMMUNITY): Payer: Self-pay | Admitting: Psychiatry

## 2019-07-28 DIAGNOSIS — F3341 Major depressive disorder, recurrent, in partial remission: Secondary | ICD-10-CM

## 2019-07-28 DIAGNOSIS — F23 Brief psychotic disorder: Secondary | ICD-10-CM | POA: Diagnosis not present

## 2019-07-28 DIAGNOSIS — R110 Nausea: Secondary | ICD-10-CM | POA: Diagnosis not present

## 2019-07-28 DIAGNOSIS — F1290 Cannabis use, unspecified, uncomplicated: Secondary | ICD-10-CM

## 2019-07-28 MED ORDER — HYDROXYZINE HCL 25 MG PO TABS: 25.0000 mg | ORAL_TABLET | Freq: Three times a day (TID) | ORAL | 0 refills | Status: DC | PRN

## 2019-07-28 MED ORDER — QUETIAPINE FUMARATE 300 MG PO TABS: 300.0000 mg | ORAL_TABLET | Freq: Every day | ORAL | 1 refills | Status: DC

## 2019-07-28 MED ORDER — MIRTAZAPINE 15 MG PO TABS: 15.0000 mg | ORAL_TABLET | Freq: Every day | ORAL | 1 refills | Status: DC

## 2019-07-28 MED ORDER — QUETIAPINE FUMARATE 200 MG PO TABS: 200.0000 mg | ORAL_TABLET | Freq: Every day | ORAL | 1 refills | Status: DC

## 2019-07-28 NOTE — Progress Notes (Signed)
Psychiatric Initial Adult Assessment   Patient Identification: Dustin Tate MRN:  [id number] Date of Evaluation:  07/28/2019 Referral Source: [facility] Chief Complaint:  Per patient "I've been nauseous" Visit Diagnosis:    ICD-10-CM   1. Nausea without vomiting  R11.0   2. Marijuana use  F12.90   3. Recurrent major depressive disorder, in partial remission (HCC)  F33.41 QUEtiapine (SEROQUEL) 200 MG tablet    mirtazapine (REMERON) 15 MG tablet    QUEtiapine (SEROQUEL) 300 MG tablet    hydrOXYzine (ATARAX/VISTARIL) 25 MG tablet    DISCONTINUED: hydrOXYzine (ATARAX/VISTARIL) 25 MG tablet  4. Brief psychotic disorder (HCC)  F23 QUEtiapine (SEROQUEL) 200 MG tablet    QUEtiapine (SEROQUEL) 300 MG tablet    History of Present Illness:24 year old male seen today for initial psychiatric evaluation.  He was recently discharged from [facility] for suicidal ideation and suicidal attempt.  He has a psychiatric history of brief psychotic disorder, marijuana use, depression, and suicidal ideation/attempt. He is being managed on Seroquel 500 mg at bedtime, hydroxyzine 25 mg 3 times a day as needed, and Zoloft 50 mg.  Today he denies SI/HI/VAH.  Today patient reports that he has been having nausea.  He informed Clinical research associate that he smokes marijuana every 2 days however notes that nausea was not a problem prior to him taking medications.  During exam patient had flat affect, poor eye contact, slow speech, and low voice volume.  He notes however that he does not feel depressed or anxious.  Since being on the medication he notes that he has had a decreased appetite.  Patient was seen with his mother who notes prior to this admission the family took a trip to [place].  During that time and she notes that her son isolated himself and spoke very little.  When they returned home she reports he  Isolated himself, was more depressed, had a decreased appetite, and spoke very  little.  She informed Clinical research associate that she asked him what was wrong and he informed her that life was not worth it and that he was going to kill himself before Friday.  Since being discharged from the hospital she reports that he still isolates himself, has decreased conversation, and has depressed moods.  She notes that he is a quiet  and calm person however notes that recently he has become less verbal.  She reports that she monitored his behaviors, locked sharp objects away, and removed items from the home that could cause him harm.  She has also taken a leave of absence from work so that she can monitor her safety.   Patient and his mother are agreeable to discontinue Zoloft and start Remeron 15 mg at bedtime to help with depressive symptoms. Potential side effects of medication and risks vs benefits of treatment vs non-treatment were explained and discussed. All questions were answered.  Writer informed patient that marijuana use can lead to increased nausea/vomiting, depression, and psycosis.  Patient was encouraged to stop his marijuana use.  He was also asked if he needed assistance with stopping use. Patient reports that he will try to cut his cannabis consumption noting that he stopped on his own in the past.  Patient also informed writer that he will take a month off of work to focus on his mental health.  No other concerns noted at this time. Associated Signs/Symptoms: Depression Symptoms:  psychomotor retardation, decreased appetite, (Hypo) Manic Symptoms:  Flight of Ideas, Anxiety Symptoms:  Denies Psychotic Symptoms:  Denies PTSD Symptoms: NA  Past Psychiatric History: Brief Psychotic disorder, marijuana use. depression, Suicidal Ideation/Attempt  Previous Psychotropic Medications: Yes   Substance Abuse History in the last 12 months:  Yes.    Consequences of Substance Abuse: Medical Consequences:  psychosis  Past Medical History:  Past Medical History:  Diagnosis Date  . Depression    . 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 Sickle cell trait (HCC)    No past surgical history on file.  Family Psychiatric History: Denies  Family History:  Family History  Problem Relation Age of Onset  . Healthy Mother   . Healthy Father     Social History:   Social History   Socioeconomic History  . Marital status: Single    Spouse name: Not on file  . Number of children: Not on file  . Years of education: Not on file  . Highest education level: Not on file  Occupational History  . 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 Smoking status: Current Some Day Smoker    Packs/day: 0.10    Years: 2.00    Pack years: 0.20    Types: Cigarettes    Start date: 2016  . Smokeless tobacco: Never Used  Vaping Use  . Vaping Use: Never assessed  Substance and Sexual Activity  . Alcohol use: No  . Drug use: Yes    Frequency: 2.0 times per week    Types: Marijuana    Comment: Advised to quit  . Sexual activity: Not Currently    Partners: Female    Birth control/protection: Condom  Other Topics Concern  . Not on file  Social History Narrative   Student at [employer]. Studies Primary school teacher   Social Determinants of Health   Financial Resource Strain:   . Difficulty of Paying Living Expenses:   Food Insecurity:   . Worried About Programme researcher, broadcasting/film/video in the Last Year:   . Barista in the Last Year:   Transportation Needs:   . Freight forwarder (Medical):   Marland Kitchen Lack of Transportation (Non-Medical):   Physical Activity:   . Days of Exercise per Week:   . Minutes of Exercise per Session:   Stress:   . Feeling of Stress :   Social Connections:   . Frequency of Communication with Friends and Family:   . Frequency of Social Gatherings with Friends and Family:   . Attends Religious Services:   . Active Member of Clubs or Organizations:   . Attends Banker Meetings:   Marland Kitchen Marital Status:     Additional Social History:  Patient resides in [place] with his mother and 2 younger brother.  He currently works at a company [employer].  He denies tobacco and alcohol use.  He endorses smoking cannabis every 2 days  Allergies:  No Known Allergies  Metabolic Disorder Labs: Lab Results  Component Value Date   HGBA1C 6.1 (H) 07/16/2019   MPG 128.37 07/16/2019   Lab Results  Component Value Date   PROLACTIN 8.6 07/16/2019   Lab Results  Component Value Date   CHOL 135 07/16/2019   TRIG 42 07/16/2019   HDL 48 07/16/2019   CHOLHDL 2.8 07/16/2019   VLDL 8 07/16/2019   LDLCALC 79 07/16/2019   Lab Results  Component Value Date   TSH 1.009 07/10/2019    Therapeutic Level Labs: No results found for:  LITHIUM No results found for: CBMZ No results found for: VALPROATE  Current Medications: Current Outpatient Medications  Medication Sig Dispense Refill  . hydrOXYzine (ATARAX/VISTARIL) 25 MG tablet Take 1 tablet (25 mg total) by mouth 3 (three) times daily as needed for anxiety. 30 tablet 0  . mirtazapine (REMERON) 15 MG tablet Take 1 tablet (15 mg total) by mouth at bedtime. 30 tablet 1  . QUEtiapine (SEROQUEL) 200 MG tablet Take 1 tablet (200 mg total) by mouth at bedtime. 30 tablet 1  . QUEtiapine (SEROQUEL) 300 MG tablet Take 1 tablet (300 mg total) by mouth at bedtime. 60 tablet 1   No current facility-administered medications for this visit.    Musculoskeletal: Strength & Muscle Tone: within normal limits Gait & Station: normal Patient leans: N/A  Psychiatric Specialty Exam: Review of Systems  There were no vitals taken for this visit.There is no height or weight on file to calculate BMI.  General Appearance: Well Groomed  Eye Contact:  Poor  Speech:  Clear and Coherent and Slow  Volume:  Decreased  Mood:  Depressed  Affect:  Congruent and Flat  Thought Process:  Coherent, Goal Directed and Linear  Orientation:  Full (Time, Place, and Person)  Thought Content:  WDL and Logical   Suicidal Thoughts:  No  Homicidal Thoughts:  No  Memory:  Immediate;   Good Recent;   Good Remote;   Good  Judgement:  Fair  Insight:  Fair  Psychomotor Activity:  Decreased  Concentration:  Concentration: Good and Attention Span: Good  Recall:  Good  Fund of Knowledge:Good  Language: Good  Akathisia:  No  Handed:  Right  AIMS (if indicated):  Not done  Assets:  Communication Skills Desire for Improvement Financial Resources/Insurance Housing Social Support  ADL's:  Intact  Cognition: WNL  Sleep:  Good   Screenings: AUDIT     Admission (Discharged) from 07/08/2019 in [facility]  Alcohol Use Disorder Identification Test Final Score (AUDIT) 0    PHQ2-9     Office Visit from 05/18/2019 in [facility] Office Visit from 09/29/2018 in [facility] Office Visit from 08/20/2017 in [place] Office Visit from 09/18/2016 in [facility]  PHQ-2 Total Score 0 0 0 0      Assessment and Plan: Patient endorses nausea since being on Zoloft.  He is agreeable to discontinue Zoloft and start Remeron 15 mg at bedtime to help with depressive symptoms.  Patient also reports that he will try to cut his cannabis consumption down due to potential risk of psychosis, nausea and depression.  1. Marijuana use   2. Nausea without vomiting   3. Recurrent major depressive disorder, in partial remission (HCC)  Continue- QUEtiapine (SEROQUEL) 200 MG tablet; Take 1 tablet (200 mg total) by mouth at bedtime.  Dispense: 30 tablet; Refill: 1 Start- mirtazapine (REMERON) 15 MG tablet; Take 1 tablet (15 mg total) by mouth at bedtime.  Dispense: 30 tablet; Refill: 1 Continue- QUEtiapine (SEROQUEL) 300 MG tablet; Take 1 tablet (300 mg total) by mouth at bedtime.  Dispense: 60 tablet; Refill: 1 Continue- hydrOXYzine (ATARAX/VISTARIL) 25 MG tablet; Take 1 tablet (25 mg total) by mouth 3 (three)  times daily as needed for anxiety.  Dispense: 30 tablet; Refill: 0  4. Brief psychotic disorder (HCC)  Continue- QUEtiapine (SEROQUEL) 200 MG tablet; Take 1 tablet (200 mg total) by mouth at bedtime.  Dispense: 30  tablet; Refill: 1 Continue- QUEtiapine (SEROQUEL) 300 MG tablet; Take 1 tablet (300 mg total) by mouth at bedtime.  Dispense: 60 tablet; Refill: 1  Follow-up in 1 month  Shanna Cisco, NP 6/24/20211:20 PM

## 2019-07-29 ENCOUNTER — Telehealth (HOSPITAL_COMMUNITY): Payer: BC Managed Care – PPO

## 2019-08-11 ENCOUNTER — Ambulatory Visit (HOSPITAL_COMMUNITY): Payer: BC Managed Care – PPO | Admitting: Psychiatry

## 2019-08-11 ENCOUNTER — Ambulatory Visit (HOSPITAL_COMMUNITY): Payer: Medicaid Other | Admitting: Licensed Clinical Social Worker

## 2019-08-11 ENCOUNTER — Encounter (HOSPITAL_COMMUNITY): Payer: Self-pay

## 2019-08-26 ENCOUNTER — Encounter (HOSPITAL_COMMUNITY): Payer: Self-pay | Admitting: Psychiatry

## 2019-08-26 ENCOUNTER — Ambulatory Visit (HOSPITAL_COMMUNITY): Payer: Medicaid Other | Admitting: Psychiatry

## 2019-08-26 ENCOUNTER — Other Ambulatory Visit: Payer: Self-pay

## 2019-08-26 DIAGNOSIS — F3341 Major depressive disorder, recurrent, in partial remission: Secondary | ICD-10-CM

## 2019-08-26 DIAGNOSIS — F23 Brief psychotic disorder: Secondary | ICD-10-CM

## 2019-08-26 MED ORDER — MIRTAZAPINE 15 MG PO TABS: 15.0000 mg | ORAL_TABLET | Freq: Every day | ORAL | 2 refills | Status: DC

## 2019-08-26 MED ORDER — HYDROXYZINE HCL 25 MG PO TABS: 25.0000 mg | ORAL_TABLET | Freq: Three times a day (TID) | ORAL | 2 refills | Status: DC | PRN

## 2019-08-26 MED ORDER — QUETIAPINE FUMARATE 200 MG PO TABS: 200.0000 mg | ORAL_TABLET | Freq: Every day | ORAL | 2 refills | Status: DC

## 2019-08-26 MED ORDER — QUETIAPINE FUMARATE 300 MG PO TABS: 300.0000 mg | ORAL_TABLET | Freq: Every day | ORAL | 2 refills | Status: DC

## 2019-08-26 NOTE — Progress Notes (Signed)
BH MD/PA/NP OP Progress Note  08/26/2019 3:47 PM Dustin Tate  MRN:  [id number]  Chief Complaint: "Im doing good"        Per mother "He's sleeping and he back to himself".   HPI: 24 year old male seen today for followup psychiatric evaluation. He has a psychiatric history of brief psychotic disorder, marijuana use, depression, and suicidal ideation/attempt. He is being managed on Seroquel 500 mg at bedtime, hydroxyzine 25 mg 3 times a day as needed, and Remeron 15 mg HS  Today he denies SI/HI/VAH.  Today patient reports that he has been doing well on his current medication regimen. He denies symptoms of anxiety and depression and endorses adequate sleep. Patient is more talkative today compared to past visit. He is also well groomed and engaged in good eye coontact.  He was seen with his mother who notes that he is doing better. She notes that he has been sleeping better and  informed writer that he is back to his old self. She notes that she feels comfortable returning to work because his mental health is more stable. The patient notes that he is ready to return to work as well.  Patient is agreeable to continue all medications as prescribed and follow up with provider in 2 months. No other concerns noted at this time.    Visit Diagnosis:    ICD-10-CM   1. Recurrent major depressive disorder, in partial remission (HCC)  F33.41 mirtazapine (REMERON) 15 MG tablet    hydrOXYzine (ATARAX/VISTARIL) 25 MG tablet    QUEtiapine (SEROQUEL) 200 MG tablet    QUEtiapine (SEROQUEL) 300 MG tablet  2. Brief psychotic disorder (HCC)  F23 QUEtiapine (SEROQUEL) 200 MG tablet    QUEtiapine (SEROQUEL) 300 MG tablet    Past Psychiatric History: Brief Psychotic disorder, marijuana use. depression, Suicidal Ideation/Attempt  Past Medical History:  Past Medical History:  Diagnosis Date  . Depression   . 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 Sickle cell trait (HCC)    No past surgical history on file.  Family  Psychiatric History: Denies  Family History:  Family History  Problem Relation Age of Onset  . Healthy Mother   . Healthy Father     Social History:  Social History   Socioeconomic History  . Marital status: Single    Spouse name: Not on file  . Number of children: Not on file  . Years of education: Not on file  . Highest education level: Not on file  Occupational History  . 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 Smoking status: Current Some Day Smoker    Packs/day: 0.10    Years: 2.00    Pack years: 0.20    Types: Cigarettes    Start date: 2016  . Smokeless tobacco: Never Used  Vaping Use  . Vaping Use: Never assessed  Substance and Sexual Activity  . Alcohol use: No  . Drug use: Yes    Frequency: 2.0 times per week    Types: Marijuana    Comment: Advised to quit  . Sexual activity: Not Currently    Partners: Female    Birth control/protection: Condom  Other Topics Concern  . Not on file  Social History Narrative   Student at [employer]. Studies Primary school teacher   Social Determinants of Health   Financial Resource Strain:   . Difficulty of Paying Living Expenses:   Food Insecurity:   .  Worried About Programme researcher, broadcasting/film/video in the Last Year:   . Barista in the Last Year:   Transportation Needs:   . Freight forwarder (Medical):   Marland Kitchen Lack of Transportation (Non-Medical):   Physical Activity:   . Days of Exercise per Week:   . Minutes of Exercise per Session:   Stress:   . Feeling of Stress :   Social Connections:   . Frequency of Communication with Friends and Family:   . Frequency of Social Gatherings with Friends and Family:   . Attends Religious Services:   . Active Member of Clubs or Organizations:   . Attends Banker Meetings:   Marland Kitchen Marital Status:     Allergies: No Known Allergies  Metabolic Disorder Labs: Lab Results  Component Value Date   HGBA1C 6.1 (H) 07/16/2019   MPG  128.37 07/16/2019   Lab Results  Component Value Date   PROLACTIN 8.6 07/16/2019   Lab Results  Component Value Date   CHOL 135 07/16/2019   TRIG 42 07/16/2019   HDL 48 07/16/2019   CHOLHDL 2.8 07/16/2019   VLDL 8 07/16/2019   LDLCALC 79 07/16/2019   Lab Results  Component Value Date   TSH 1.009 07/10/2019    Therapeutic Level Labs: No results found for: LITHIUM No results found for: VALPROATE No components found for:  CBMZ  Current Medications: Current Outpatient Medications  Medication Sig Dispense Refill  . hydrOXYzine (ATARAX/VISTARIL) 25 MG tablet Take 1 tablet (25 mg total) by mouth 3 (three) times daily as needed for anxiety. 90 tablet 2  . mirtazapine (REMERON) 15 MG tablet Take 1 tablet (15 mg total) by mouth at bedtime. 30 tablet 2  . QUEtiapine (SEROQUEL) 200 MG tablet Take 1 tablet (200 mg total) by mouth at bedtime. 30 tablet 2  . QUEtiapine (SEROQUEL) 300 MG tablet Take 1 tablet (300 mg total) by mouth at bedtime. 30 tablet 2   No current facility-administered medications for this visit.     Musculoskeletal: Strength & Muscle Tone: within normal limits Gait & Station: normal Patient leans: N/A  Psychiatric Specialty Exam: Review of Systems  There were no vitals taken for this visit.There is no height or weight on file to calculate BMI.  General Appearance: Well Groomed  Eye Contact:  Good  Speech:  Clear and Coherent and Normal Rate  Volume:  Normal  Mood:  Euthymic  Affect:  Congruent  Thought Process:  Coherent, Goal Directed and Linear  Orientation:  Full (Time, Place, and Person)  Thought Content: WDL and Logical   Suicidal Thoughts:  No  Homicidal Thoughts:  No  Memory:  Immediate;   Good Recent;   Good Remote;   Good  Judgement:  Good  Insight:  Good  Psychomotor Activity:  Normal  Concentration:  Concentration: Good and Attention Span: Good  Recall:  Good  Fund of Knowledge: Good  Language: Good  Akathisia:  No  Handed:  Right   AIMS (if indicated): not done  Assets:  Communication Skills Desire for Improvement Financial Resources/Insurance Housing Social Support  ADL's:  Intact  Cognition: WNL  Sleep:  Good   Screenings: AUDIT     [facility]  Alcohol Use Disorder Identification Test Final Score (AUDIT) 0    PHQ2-9     Office Visit from 05/18/2019 in [facility] Office Visit from 09/29/2018 in [facility]  Visit from 08/20/2017 in [facility] Office Visit from 09/18/2016 in [facility]  PHQ-2 Total Score 0 0 0 0       Assessment and Plan: Patient reports that he is doing well on his current medication regimen. He is agreeable to continue all medications as prescribe.   1. Recurrent major depressive disorder, in partial remission (HCC)  Continue- mirtazapine (REMERON) 15 MG tablet; Take 1 tablet (15 mg total) by mouth at bedtime.  Dispense: 30 tablet; Refill: 2 Continue- hydrOXYzine (ATARAX/VISTARIL) 25 MG tablet; Take 1 tablet (25 mg total) by mouth 3 (three) times daily as needed for anxiety.  Dispense: 90 tablet; Refill: 2 Continue- QUEtiapine (SEROQUEL) 200 MG tablet; Take 1 tablet (200 mg total) by mouth at bedtime.  Dispense: 30 tablet; Refill: 2 Continue- QUEtiapine (SEROQUEL) 300 MG tablet; Take 1 tablet (300 mg total) by mouth at bedtime.  Dispense: 30 tablet; Refill: 2  2. Brief psychotic disorder (HCC)  Continue- QUEtiapine (SEROQUEL) 200 MG tablet; Take 1 tablet (200 mg total) by mouth at bedtime.  Dispense: 30 tablet; Refill: 2 Continue- QUEtiapine (SEROQUEL) 300 MG tablet; Take 1 tablet (300 mg total) by mouth at bedtime.  Dispense: 30 tablet; Refill: 2  Follow up in 2 months   Shanna Cisco, NP 08/26/2019, 3:47 PM

## 2019-08-29 ENCOUNTER — Encounter (HOSPITAL_COMMUNITY): Payer: BC Managed Care – PPO | Admitting: Psychiatry

## 2019-10-13 ENCOUNTER — Ambulatory Visit (INDEPENDENT_AMBULATORY_CARE_PROVIDER_SITE_OTHER): Payer: BC Managed Care – PPO | Admitting: Family Medicine

## 2019-10-13 ENCOUNTER — Encounter: Payer: Self-pay | Admitting: Family Medicine

## 2019-10-13 ENCOUNTER — Other Ambulatory Visit: Payer: Self-pay

## 2019-10-13 VITALS — BP 120/72 | HR 79 | Ht 64.0 in | Wt 113.8 lb

## 2019-10-13 DIAGNOSIS — F339 Major depressive disorder, recurrent, unspecified: Secondary | ICD-10-CM | POA: Diagnosis not present

## 2019-10-13 DIAGNOSIS — Z0000 Encounter for general adult medical examination without abnormal findings: Secondary | ICD-10-CM

## 2019-10-13 DIAGNOSIS — F17200 Nicotine dependence, unspecified, uncomplicated: Secondary | ICD-10-CM | POA: Diagnosis not present

## 2019-10-13 MED ORDER — NICOTINE POLACRILEX 2 MG MT GUM: 2.0000 mg | CHEWING_GUM | OROMUCOSAL | 0 refills | Status: DC | PRN

## 2019-10-13 NOTE — Patient Instructions (Addendum)
Today you were seen for your physical exam.  Follow up with your therapist and behavioral health.   You appear to be doing well. I think you have a great outlook about improvement.   I have given you nicotine gum to help with quitting smoking. Follow the directions. Proper chewing of gum is important for optimal results. "Chew and park" is recommended: chew the gum until the nicotine taste appears, then "park" the gum against the buccal mucosa until the taste disappears, then chew a few more times to release more nicotine. Repeat this for 30 minutes, then discard the gum (because all nicotine in the gum has been released).  Preventive Care 23-23 Years Old, Male Preventive care refers to lifestyle choices and visits with your health care provider that can promote health and wellness. This includes:  A yearly physical exam. This is also called an annual well check.  Regular dental and eye exams.  Immunizations.  Screening for certain conditions.  Healthy lifestyle choices, such as eating a healthy diet, getting regular exercise, not using drugs or products that contain nicotine and tobacco, and limiting alcohol use. What can I expect for my preventive care visit? Physical exam Your health care provider will check:  Height and weight. These may be used to calculate body mass index (BMI), which is a measurement that tells if you are at a healthy weight.  Heart rate and blood pressure.  Your skin for abnormal spots. Counseling Your health care provider may ask you questions about:  Alcohol, tobacco, and drug use.  Emotional well-being.  Home and relationship well-being.  Sexual activity.  Eating habits.  Work and work Statistician. What immunizations do I need?  Influenza (flu) vaccine  This is recommended every 23 year. Tetanus, diphtheria, and pertussis (Tdap) vaccine  You may need a Td booster every 23 years. Varicella (chickenpox) vaccine  You may need this vaccine if  you have not already been vaccinated. Human papillomavirus (HPV) vaccine  If recommended by your health care provider, you may need three doses over 6 months. Measles, mumps, and rubella (MMR) vaccine  You may need at least one dose of MMR. You may also need a second dose. Meningococcal conjugate (MenACWY) vaccine  One dose is recommended if you are 23-23 years old and a Market researcher living in a residence hall, or if you have one of several medical conditions. You may also need additional booster doses. Pneumococcal conjugate (PCV13) vaccine  You may need this if you have certain conditions and were not previously vaccinated. Pneumococcal polysaccharide (PPSV23) vaccine  You may need one or two doses if you smoke cigarettes or if you have certain conditions. Hepatitis A vaccine  You may need this if you have certain conditions or if you travel or work in places where you may be exposed to hepatitis A. Hepatitis B vaccine  You may need this if you have certain conditions or if you travel or work in places where you may be exposed to hepatitis B. Haemophilus influenzae type b (Hib) vaccine  You may need this if you have certain risk factors. You may receive vaccines as individual doses or as more than one vaccine together in one shot (combination vaccines). Talk with your health care provider about the risks and benefits of combination vaccines. What tests do I need? Blood tests  Lipid and cholesterol levels. These may be checked every 5 years starting at age 23.  Hepatitis C test.  Hepatitis B test. Screening   Diabetes  screening. This is done by checking your blood sugar (glucose) after you have not eaten for a while (fasting).  Sexually transmitted disease (STD) testing. Talk with your health care provider about your test results, treatment options, and if necessary, the need for more tests. Follow these instructions at home: Eating and drinking   Eat a  diet that includes fresh fruits and vegetables, whole grains, lean protein, and low-fat dairy products.  Take vitamin and mineral supplements as recommended by your health care provider.  Do not drink alcohol if your health care provider tells you not to drink.  If you drink alcohol: ? Limit how much you have to 0-2 drinks a day. ? Be aware of how much alcohol is in your drink. In the [place] one drink equals one 12 oz bottle of beer (355 mL), one 5 oz glass of wine (148 mL), or one 1 oz glass of hard liquor (44 mL). Lifestyle  Take daily care of your teeth and gums.  Stay active. Exercise for at least 30 minutes on 5 or more days each week.  Do not use any products that contain nicotine or tobacco, such as cigarettes, e-cigarettes, and chewing tobacco. If you need help quitting, ask your health care provider.  If you are sexually active, practice safe sex. Use a condom or other form of protection to prevent STIs (sexually transmitted infections). What's next?  Go to your health care provider once a year for a well check visit.  Ask your health care provider how often you should have your eyes and teeth checked.  Stay up to date on all vaccines. This information is not intended to replace advice given to you by your health care provider. Make sure you discuss any questions you have with your health care provider. Document Revised: 01/14/2018 Document Reviewed: 01/14/2018 Elsevier Patient Education  2020 [employer].

## 2019-10-13 NOTE — Progress Notes (Signed)
    SUBJECTIVE:   Chief compliant/HPI: annual examination  Dustin Tate is a 24 y.o. who presents today for an annual exam.   Reviewed and updated history.   Review of systems form without concerns.   OBJECTIVE:   BP 120/72   Pulse 79   Ht 5\' 4"  (1.626 m)   Wt 113 lb 12.8 oz (51.6 kg)   SpO2 98%   BMI 19.53 kg/m   General: Appears well, no acute distress. Age appropriate. HEENT: Normocephalic, PERRL, TMs normal bilaterally, nares patent, no lymphadenopathy or thyromegaly/nodules Cardiac: RRR, normal heart sounds, no murmurs Respiratory: CTAB, normal effort Abdomen: soft, nontender, nondistended Extremities: No edema or cyanosis. Skin: Warm and dry, no rashes noted Neuro: alert and oriented, no focal deficits. U+L ext. Reflexes symmetrical PHQ9 SCORE ONLY 10/17/2019 05/18/2019 09/29/2018  PHQ-9 Total Score 5 0 0    ASSESSMENT/PLAN:   Depression, recurrent (HCC) PHQ 9 with mild depression score today. Connected with Behavioral health. See NP 10/01/2018 who also manages his medications. He is currently on atarax, remeron, and seroquel. Hx of sucidial ideation. Not present today and without a plan -Continue care at behavioral health and medications  Tobacco use disorder Endorsing 2 cigarettes every other day. Would like to quit. Prefers gum. -Nicorette gum rx to pharmacy; discuss how to properly chew gum for full effect  Well adult exam No concerns today. Doing well from a mood standpoint.  -HIV, HepC health maintenance labs ordered   Annual Examination  See AVS for age appropriate recommendations  PHQ score 5, reviewed and discussed.  Blood pressure reviewed and at goal.   Considered the following items based upon USPSTF recommendations: HIV testing: ordered Hepatitis C: ordered Hepatitis B: not indicated Syphilis if at high risk: {declined GC/CTdeclined Lipid panel (nonfasting or fasting) discussed based upon AHA recommendations and not ordered.  Consider  repeat every 4-6 years.  Reviewed risk factors for latent tuberculosis and not indicated Immunizations Flu vaccine today   Follow up in 1 year or sooner if indicated.    Doyne Keel, DO [facility]

## 2019-10-14 LAB — HCV INTERPRETATION

## 2019-10-14 LAB — HIV ANTIBODY (ROUTINE TESTING W REFLEX): HIV Screen 4th Generation wRfx: NONREACTIVE

## 2019-10-14 LAB — HCV AB W REFLEX TO QUANT PCR: HCV Ab: <0.1 s/co ratio (ref 0.0–0.9)

## 2019-10-17 ENCOUNTER — Encounter: Payer: Self-pay | Admitting: Family Medicine

## 2019-10-17 DIAGNOSIS — F339 Major depressive disorder, recurrent, unspecified: Secondary | ICD-10-CM | POA: Insufficient documentation

## 2019-10-17 NOTE — Assessment & Plan Note (Signed)
No concerns today. Doing well from a mood standpoint.  -HIV, HepC health maintenance labs ordered

## 2019-10-17 NOTE — Assessment & Plan Note (Signed)
PHQ 9 with mild depression score today. Connected with Behavioral health. See NP Doyne Keel who also manages his medications. He is currently on atarax, remeron, and seroquel. Hx of sucidial ideation. Not present today and without a plan -Continue care at behavioral health and medications

## 2019-10-17 NOTE — Assessment & Plan Note (Signed)
Endorsing 2 cigarettes every other day. Would like to quit. Prefers gum. -Nicorette gum rx to pharmacy; discuss how to properly chew gum for full effect

## 2019-10-27 ENCOUNTER — Other Ambulatory Visit: Payer: Self-pay

## 2019-10-27 ENCOUNTER — Encounter (HOSPITAL_COMMUNITY): Payer: Self-pay | Admitting: Psychiatry

## 2019-10-27 ENCOUNTER — Telehealth (INDEPENDENT_AMBULATORY_CARE_PROVIDER_SITE_OTHER): Payer: BC Managed Care – PPO | Admitting: Psychiatry

## 2019-10-27 DIAGNOSIS — F17210 Nicotine dependence, cigarettes, uncomplicated: Secondary | ICD-10-CM

## 2019-10-27 DIAGNOSIS — F23 Brief psychotic disorder: Secondary | ICD-10-CM

## 2019-10-27 DIAGNOSIS — F3341 Major depressive disorder, recurrent, in partial remission: Secondary | ICD-10-CM

## 2019-10-27 DIAGNOSIS — F17200 Nicotine dependence, unspecified, uncomplicated: Secondary | ICD-10-CM | POA: Diagnosis not present

## 2019-10-27 MED ORDER — MIRTAZAPINE 15 MG PO TABS: 15.0000 mg | ORAL_TABLET | Freq: Every day | ORAL | 2 refills | Status: DC

## 2019-10-27 MED ORDER — QUETIAPINE FUMARATE 300 MG PO TABS: 300.0000 mg | ORAL_TABLET | Freq: Every day | ORAL | 2 refills | Status: DC

## 2019-10-27 MED ORDER — NICOTINE POLACRILEX 2 MG MT GUM: 2.0000 mg | CHEWING_GUM | OROMUCOSAL | 2 refills | Status: DC | PRN

## 2019-10-27 MED ORDER — QUETIAPINE FUMARATE 200 MG PO TABS: 200.0000 mg | ORAL_TABLET | Freq: Every day | ORAL | 2 refills | Status: DC

## 2019-10-27 MED ORDER — HYDROXYZINE HCL 25 MG PO TABS: 25.0000 mg | ORAL_TABLET | Freq: Three times a day (TID) | ORAL | 2 refills | Status: DC | PRN

## 2019-10-27 NOTE — Progress Notes (Signed)
BH MD/PA/NP OP Progress Note Virtual Visit via Video Note  I connected with Dustin Tate on 10/27/19 at  4:30 PM EDT by a video enabled telemedicine application and verified that I am speaking with the correct person using two identifiers.  Location: Patient: Home Provider: Clinic   I discussed the limitations of evaluation and management by telemedicine and the availability of in person appointments. The patient expressed understanding and agreed to proceed.  I provided of non-face-to-face time during this encounter.     10/27/2019 4:43 PM Dustin Tate  MRN:  [id number]  Chief Complaint: "I guess the medication is working"        Per mother "He's doing okay"   HPI: 24 year old male seen today for followup psychiatric evaluation. He has a psychiatric history of brief psychotic disorder, marijuana use, depression, and suicidal ideation/attempt. He is being managed on Seroquel 500 mg at bedtime, hydroxyzine 25 mg 3 times a day as needed, and Remeron 15 mg HS  Today he denies SI/HI/VAH.  Today patient is well groomed, pleasant, cooperative, engaged in conversation, and maintained eye contact.  He reports that he has been doing well on his current medication regimen. He denies symptoms of anxiety and depression and endorses adequate sleep and apetite.  He was seen with his mother who notes that he is doing well. She informed provider that he was recently started on Nicorette Gum to help manage his nicotine dependence. The patient notes it is somewhat effective and notes he is down to smoking tow cigarettes per day. He also notes that he has reduced his consumption of marijuana.  He denies SI/HI/VAH or paranoia.   No medication adjustments made today. He is agreeable to continue all medications as prescribed and follow up with provider in 3 months. No other concerns noted at this time.    Visit Diagnosis:    ICD-10-CM   1. Cigarette nicotine dependence without  complication  F17.210 nicotine polacrilex (NICORETTE) 2 MG gum  2. Recurrent major depressive disorder, in partial remission (HCC)  F33.41 hydrOXYzine (ATARAX/VISTARIL) 25 MG tablet    mirtazapine (REMERON) 15 MG tablet    QUEtiapine (SEROQUEL) 200 MG tablet    QUEtiapine (SEROQUEL) 300 MG tablet  3. Brief psychotic disorder (HCC)  F23 QUEtiapine (SEROQUEL) 200 MG tablet    QUEtiapine (SEROQUEL) 300 MG tablet  4. Tobacco use disorder  F17.200 nicotine polacrilex (NICORETTE) 2 MG gum    Past Psychiatric History: Brief Psychotic disorder, marijuana use. depression, Suicidal Ideation/Attempt  Past Medical History:  Past Medical History:  Diagnosis Date  . Depression   . 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 Sickle cell trait (HCC)    No past surgical history on file.  Family Psychiatric History: Denies  Family History:  Family History  Problem Relation Age of Onset  . Healthy Mother   . Healthy Father     Social History:  Social History   Socioeconomic History  . Marital status: Single    Spouse name: Not on file  . Number of children: Not on file  . Years of education: Not on file  . Highest education level: Not on file  Occupational History  . 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 Smoking status: Current Some Day Smoker    Packs/day: 0.10    Years: 5.00    Pack years: 0.50    Types: Cigarettes    Start  date: 2016  . Smokeless tobacco: Never Used  Vaping Use  . Vaping Use: Never assessed  Substance and Sexual Activity  . Alcohol use: No  . Drug use: Yes    Frequency: 2.0 times per week    Types: Marijuana    Comment: Advised to quit  . Sexual activity: Not Currently    Partners: Female    Birth control/protection: Condom  Other Topics Concern  . 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 Difficulty of Paying  Living Expenses: Not on file  Food Insecurity:   . Worried About Programme researcher, broadcasting/film/video in the Last Year: Not on file  . Ran Out of Food in the Last Year: Not on file  Transportation Needs:   . Lack of Transportation (Medical): Not on file  . Lack of Transportation (Non-Medical): Not on file  Physical Activity:   . Days of Exercise per Week: Not on file  . Minutes of Exercise per Session: Not on file  Stress:   . Feeling of Stress : Not on file  Social Connections:   . Frequency of Communication with Friends and Family: Not on file  . Frequency of Social Gatherings with Friends and Family: Not on file  . Attends Religious Services: Not on file  . Active Member of Clubs or Organizations: Not on file  . Attends Banker Meetings: Not on file  . Marital Status: Not on file    Allergies: No Known Allergies  Metabolic Disorder Labs: Lab Results  Component Value Date   HGBA1C 6.1 (H) 07/16/2019   MPG 128.37 07/16/2019   Lab Results  Component Value Date   PROLACTIN 8.6 07/16/2019   Lab Results  Component Value Date   CHOL 135 07/16/2019   TRIG 42 07/16/2019   HDL 48 07/16/2019   CHOLHDL 2.8 07/16/2019   VLDL 8 07/16/2019   LDLCALC 79 07/16/2019   Lab Results  Component Value Date   TSH 1.009 07/10/2019    Therapeutic Level Labs: No results found for: LITHIUM No results found for: VALPROATE No components found for:  CBMZ  Current Medications: Current Outpatient Medications  Medication Sig Dispense Refill  . hydrOXYzine (ATARAX/VISTARIL) 25 MG tablet Take 1 tablet (25 mg total) by mouth 3 (three) times daily as needed for anxiety. 90 tablet 2  . mirtazapine (REMERON) 15 MG tablet Take 1 tablet (15 mg total) by mouth at bedtime. 30 tablet 2  . nicotine polacrilex (NICORETTE) 2 MG gum Take 1 each (2 mg total) by mouth as needed for smoking cessation. 100 tablet 2  . QUEtiapine (SEROQUEL) 200 MG tablet Take 1 tablet (200 mg total) by mouth at bedtime. 30 tablet  2  . QUEtiapine (SEROQUEL) 300 MG tablet Take 1 tablet (300 mg total) by mouth at bedtime. 30 tablet 2   No current facility-administered medications for this visit.     Musculoskeletal: Strength & Muscle Tone: Unable to assess due to telehealth visit Gait & Station: Unable to assess due to telehealth visit Patient leans: N/A  Psychiatric Specialty Exam: Review of Systems  There were no vitals taken for this visit.There is no height or weight on file to calculate BMI.  General Appearance: Well Groomed  Eye Contact:  Good  Speech:  Clear and Coherent and Normal Rate  Volume:  Normal  Mood:  Euthymic  Affect:  Congruent  Thought Process:  Coherent, Goal Directed and Linear  Orientation:  Full (Time, Place, and Person)  Thought Content: WDL and Logical   Suicidal Thoughts:  No  Homicidal Thoughts:  No  Memory:  Immediate;   Good Recent;   Good Remote;   Good  Judgement:  Good  Insight:  Good  Psychomotor Activity:  Normal  Concentration:  Concentration: Good and Attention Span: Good  Recall:  Good  Fund of Knowledge: Good  Language: Good  Akathisia:  No  Handed:  Right  AIMS (if indicated): not done  Assets:  Communication Skills Desire for Improvement Financial Resources/Insurance Housing Social Support  ADL's:  Intact  Cognition: WNL  Sleep:  Good   Screenings: AUDIT     [facility]  Alcohol Use Disorder Identification Test Final Score (AUDIT) 0    PHQ2-9     [facility]18[facility]2019 in [facility] Office Visit from 09/29/2018 in [facility] Office Visit from 08/20/2017 in [facility] Office Visit from 09/18/2016 in [facility]  PHQ-2 Total Score 4 0 0 0 0  PHQ-9 Total Score 5 -- -- -- --       Assessment and Plan: Patient reports that  he is doing well on his current medication regimen. He is agreeable to continue all medications as prescribe.   1. Recurrent major depressive disorder, in partial remission (HCC)  Continue- mirtazapine (REMERON) 15 MG tablet; Take 1 tablet (15 mg total) by mouth at bedtime.  Dispense: 30 tablet; Refill: 2 Continue- hydrOXYzine (ATARAX/VISTARIL) 25 MG tablet; Take 1 tablet (25 mg total) by mouth 3 (three) times daily as needed for anxiety.  Dispense: 90 tablet; Refill: 2 Continue- QUEtiapine (SEROQUEL) 200 MG tablet; Take 1 tablet (200 mg total) by mouth at bedtime.  Dispense: 30 tablet; Refill: 2 Continue- QUEtiapine (SEROQUEL) 300 MG tablet; Take 1 tablet (300 mg total) by mouth at bedtime.  Dispense: 30 tablet; Refill: 2  2. Brief psychotic disorder (HCC)  Continue- QUEtiapine (SEROQUEL) 200 MG tablet; Take 1 tablet (200 mg total) by mouth at bedtime.  Dispense: 30 tablet; Refill: 2 Continue- QUEtiapine (SEROQUEL) 300 MG tablet; Take 1 tablet (300 mg total) by mouth at bedtime.  Dispense: 30 tablet; Refill: 2  Follow up in 3 months   Shanna Cisco, NP 10/27/2019, 4:43 PM

## 2019-11-04 ENCOUNTER — Other Ambulatory Visit (HOSPITAL_COMMUNITY): Payer: Self-pay | Admitting: Psychiatry

## 2019-11-04 DIAGNOSIS — F3341 Major depressive disorder, recurrent, in partial remission: Secondary | ICD-10-CM

## 2019-11-04 DIAGNOSIS — F23 Brief psychotic disorder: Secondary | ICD-10-CM

## 2019-11-07 ENCOUNTER — Encounter (HOSPITAL_COMMUNITY): Payer: Self-pay

## 2019-11-07 ENCOUNTER — Ambulatory Visit (HOSPITAL_COMMUNITY): Payer: BC Managed Care – PPO | Admitting: Licensed Clinical Social Worker

## 2020-01-16 ENCOUNTER — Other Ambulatory Visit: Payer: Self-pay

## 2020-01-16 ENCOUNTER — Ambulatory Visit (INDEPENDENT_AMBULATORY_CARE_PROVIDER_SITE_OTHER): Payer: BC Managed Care – PPO | Admitting: Licensed Clinical Social Worker

## 2020-01-16 ENCOUNTER — Encounter (HOSPITAL_COMMUNITY): Payer: Self-pay | Admitting: Licensed Clinical Social Worker

## 2020-01-16 DIAGNOSIS — F3341 Major depressive disorder, recurrent, in partial remission: Secondary | ICD-10-CM | POA: Diagnosis not present

## 2020-01-16 NOTE — Progress Notes (Signed)
Comprehensive Clinical Assessment (CCA) Note  01/16/2020 Dustin Tate [id number]  Chief Complaint:  Chief Complaint  Patient presents with  . nicotene   . Addiction Problem    Nicotine    Visit Diagnosis: Major depression   Client is a  24 year old male. Client is referred by [facility] for a Depression and brief psychosis .   Client states mental health symptoms as evidenced by:    Depression: Difficulty Concentrating; Hopelessness; Worthlessness; Increase/decrease in appetite     Client denies suicidal and homicidal ideations at this time Client denies hallucinations and delusions at this time  Client was screened for the following SDOH: Smoking, exercise, stress/tension, social interactions, and depression.    Assessment Information that integrates subjective and objective details with a therapist's professional interpretation:   Pt was alert and oriented x 5. He was dress casually but well groomed. He presented with flat, depressed, and anxious mood/affect. He was cooperative with avoided eye contact.   Pt presents today for initial assessment post [facility] discharge 6 months ago. Primary stressor for the pt is quitting smoking and pt is down to 2 to 3 cigarettes daily. Mother was also with pt and feel that pt needs someone to talk too. Dustin Tate reports primary symptoms as insomnia. He is taking all his medications as prescribed but is having problems falling asleep.   Pt would like to gain more formal support through therapy while learning the signs and symptoms of his Dx. Pt has informal support system with his immediate family. He does not have many friends currently. Primary hobbies include video games and sleeping. Dustin Tate would like to improve social interactions.   LCSW spoke with pt and mother about plan moving forward. Pt reports that he would prefer a male therapist. LCSW was agreeable to refer pt to a male therapist.       Client meets criteria for Major depression    Client states use of the following substances None reported    Treatment recommendations are include plan: Pt preferred a male provide, new provider with develop a plan.     Client was in agreement with treatment recommendations CCA Screening, Triage and Referral (STR)  Patient Reported Information Referral name: [facility] discharge from 6 month ago   Whom do you see for routine medical problems? Primary Care  Practice/Facility Name: Dr. Sharol Harness Name of Contact: [facility] What Do You Feel Would Help You the Most Today? Therapy; Medication   Have You Recently Been in Any Inpatient Treatment (Hospital/Detox/Crisis Center/28-Day Program)? No   Have You Ever Received Services From [facility] Before? Yes  Who [facility] at [facility]? [facility]   Have You Recently Had Any Thoughts About Hurting Yourself? No  Are You Planning to Commit Suicide/Harm Yourself At This time? No   Have you Recently Had Thoughts About Hurting Someone Dustin Tate? No   Have You Used Any Alcohol or Drugs in the Past 24 Hours? No   Have You Been Recently Discharged From Any Office Practice or Programs? Yes  Explanation of Discharge From Practice/Program: Dr. Doyne Keel     CCA Screening Triage Referral Assessment Type of Contact: Face-to-Face Date Telepsych consult ordered in [facility]:  07/08/2019  Time Telepsych consult ordered in [facility]:  0938   Patient Reported Information Reviewed? Yes  Collateral Involvement: Mother Name and Contact of Legal Guardian: -- (NA)  If Minor and Not Living with Parent(s), Who has Custody? -- (NA)  Is CPS involved or ever been involved?  Never  Is APS involved or ever been involved? Never   Patient Determined To Be At Risk for Harm To Self or Others Based on Review of Patient Reported Information or Presenting Complaint? No   Location of Assessment: [facility]   Does Patient Present under Involuntary Commitment? No data recorded IVC  Papers Initial File Date: No data recorded  [place] of Residence: [place]   Patient Currently Receiving the Following Services: No data recorded  Determination of Need: No data recorded  Options For Referral: No data recorded    CCA Biopsychosocial Intake/Chief Complaint:  nicotene depedence  Patient Reported Schizophrenia/Schizoaffective Diagnosis in Past: No   Strengths: No data recorded Preferences: No data recorded Abilities: No data recorded  Type of Services Patient Feels are Needed: therapy   Initial Clinical Notes/Concerns: insomnia   Mental Health Symptoms Depression:  Difficulty Concentrating; Hopelessness; Worthlessness; Increase/decrease in appetite   Duration of Depressive symptoms: Greater than two weeks   Mania:  No data recorded  Anxiety:   No data recorded  Psychosis:  None   Duration of Psychotic symptoms: No data recorded  Trauma:  N/A   Obsessions:  N/A   Compulsions:  N/A   Inattention:  N/A   Hyperactivity/Impulsivity:  N/A   Oppositional/Defiant Behaviors:  N/A   Emotional Irregularity:  No data recorded  Other Mood/Personality Symptoms:  No data recorded   Mental Status Exam Appearance and self-care  Stature:  Average   Weight:  Average weight   Clothing:  No data recorded  Grooming:  No data recorded  Cosmetic use:  No data recorded  Posture/gait:  Slumped   Motor activity:  Not Remarkable   Sensorium  Attention:  No data recorded  Concentration:  Preoccupied   Orientation:  X5   Recall/memory:  Normal   Affect and Mood  Affect:  Anxious; Depressed; Flat   Mood:  Anxious; Depressed   Relating  Eye contact:  Normal   Facial expression:  Depressed   Attitude toward examiner:  Argumentative   Thought and Language  Speech flow: Clear and Coherent   Thought content:  Appropriate to Mood and Circumstances   Preoccupation:  No data recorded  Hallucinations:  No data recorded  Organization:  No data  recorded  [employer] of Knowledge:  Fair   Intelligence:  Average   Abstraction:  No data recorded  Judgement:  Fair   Reality Testing:  No data recorded  Insight:  Fair   Decision Making:  Normal   Social Functioning  Social Maturity:  Isolates   Social Judgement:  Normal   Stress  Stressors:  Other (Comment) (stopping smoking)   Coping Ability:  No data recorded  Skill Deficits:  No data recorded  Supports:  Family; Church     Religion: Religion/Spirituality Are You A Religious Person?: Yes  Leisure/Recreation: Leisure / Recreation Do You Have Hobbies?: Yes Leisure and Hobbies: "Play video games, sleep"  Exercise/Diet: Exercise/Diet Do You Exercise?: Yes What Type of Exercise Do You Do?: Run/Walk Have You Gained or Lost A Significant Amount of Weight in the Past Six Months?: No Do You Follow a Special Diet?: No Do You Have Any Trouble Sleeping?: Yes Explanation of Sleeping Difficulties: staying asleep   CCA Employment/Education Employment/Work Situation: Employment / Work Situation Employment situation: Unemployed Patient's job has been impacted by current illness: No What is the longest time patient has a held a job?: Almost 1 year Where was the patient employed at that  time?: [employer] Has patient ever been in the [employer]?: No  Education: Education Last Grade Completed: 12 Did Garment/textile technologist From [employer]?: Yes Did You Attend College?: No Did You Attend Graduate School?: No Did You Have An Individualized Education Program (IIEP): No Did You Have Any Difficulty At School?: No Patient's Education Has Been Impacted by Current Illness: No   CCA Family/Childhood History Family and Relationship History: Family history Are you sexually active?: No What is your sexual orientation?: Heterosexual Has your sexual activity been affected by drugs, alcohol, medication, or emotional stress?: No Does patient have children?: No  Childhood  History:  Childhood History By whom was/is the patient raised?: Mother Additional childhood history information: "Rough" Description of patient's relationship with caregiver when they were a child: "Tight" How were you disciplined when you got in trouble as a child/adolescent?: "whoopings" Did patient suffer any verbal/emotional/physical/sexual abuse as a child?: No Has patient ever been sexually abused/assaulted/raped as an adolescent or adult?: No Witnessed domestic violence?: No Has patient been affected by domestic violence as an adult?: No  Child/Adolescent Assessment:      DSM5 Diagnoses: Patient Active Problem List   Diagnosis Date Noted  . Recurrent major depressive disorder, in partial remission (HCC) 10/27/2019  . Cigarette nicotine dependence without complication 10/27/2019  . Depression, recurrent (HCC) 10/17/2019  . Brief psychotic disorder (HCC) 07/12/2019  . Suicidal ideation 07/08/2019  . Suicidal ideations 07/08/2019  . Well adult exam 09/18/2016  . History of prematurity 09/18/2016  . Sickle cell trait (HCC) 09/18/2016  . Tobacco use disorder 09/18/2016  . Marijuana use 09/18/2016     Weber Cooks, LCSW

## 2020-01-21 ENCOUNTER — Ambulatory Visit: Payer: BC Managed Care – PPO | Attending: Internal Medicine

## 2020-01-21 DIAGNOSIS — Z23 Encounter for immunization: Secondary | ICD-10-CM

## 2020-01-21 NOTE — Progress Notes (Signed)
   Covid-19 Vaccination Clinic  Name:  Dustin Tate    MRN: [id number] DOB: 09/23/1995  01/21/2020  [name] was observed post Covid-19 immunization for 15 minutes without incident. He was provided with Vaccine Information Sheet and instruction to access the V-Safe system.   [name] was instructed to call 911 with any severe reactions post vaccine: Marland Kitchen Difficulty breathing  . Swelling of face and throat  . A fast heartbeat  . A bad rash all over body  . Dizziness and weakness   Immunizations Administered    Name Date Dose VIS Date Route   Pfizer COVID-19 Vaccine 01/21/2020 10:34 AM 0.3 mL 11/23/2019 Intramuscular   Manufacturer: [employer], [employer]   Lot: [id number]   NDC: [address]-4

## 2020-01-25 ENCOUNTER — Telehealth (HOSPITAL_COMMUNITY): Payer: BC Managed Care – PPO | Admitting: Psychiatry

## 2020-01-31 ENCOUNTER — Other Ambulatory Visit: Payer: Self-pay

## 2020-01-31 ENCOUNTER — Ambulatory Visit (INDEPENDENT_AMBULATORY_CARE_PROVIDER_SITE_OTHER): Payer: BC Managed Care – PPO | Admitting: Psychiatry

## 2020-01-31 ENCOUNTER — Encounter (HOSPITAL_COMMUNITY): Payer: Self-pay | Admitting: Psychiatry

## 2020-01-31 DIAGNOSIS — F3341 Major depressive disorder, recurrent, in partial remission: Secondary | ICD-10-CM | POA: Diagnosis not present

## 2020-01-31 DIAGNOSIS — F23 Brief psychotic disorder: Secondary | ICD-10-CM

## 2020-01-31 MED ORDER — HYDROXYZINE HCL 25 MG PO TABS: 25.0000 mg | ORAL_TABLET | Freq: Three times a day (TID) | ORAL | 2 refills | Status: DC | PRN

## 2020-01-31 MED ORDER — MIRTAZAPINE 15 MG PO TABS
15.0000 mg | ORAL_TABLET | Freq: Every day | ORAL | 2 refills | Status: DC
Start: 2020-01-31 — End: 2020-04-04

## 2020-01-31 MED ORDER — QUETIAPINE FUMARATE 300 MG PO TABS: 300.0000 mg | ORAL_TABLET | Freq: Every day | ORAL | 2 refills | Status: DC

## 2020-01-31 MED ORDER — QUETIAPINE FUMARATE 200 MG PO TABS: 200.0000 mg | ORAL_TABLET | Freq: Every day | ORAL | 2 refills | Status: DC

## 2020-01-31 NOTE — Progress Notes (Signed)
BH MD/PA/NP OP Progress Note Virtual Visit via Video Note  I connected with Dustin Tate on 01/31/20 at  8:00 AM EST by a video enabled telemedicine application and verified that I am speaking with the correct person using two identifiers.  Location: Patient: Home Provider: Clinic   I discussed the limitations of evaluation and management by telemedicine and the availability of in person appointments. The patient expressed understanding and agreed to proceed.  I provided of non-face-to-face time during this encounter.     01/31/2020 9:02 AM Dustin Tate  MRN:  [id number]  Chief Complaint:  Chief Complaint    Medication Management    "I guess the medication is working"        Per mother "He's doing okay"   HPI: 24 year old male seen today for follow up psychiatric evaluation. He has a psychiatric history of brief psychotic disorder, marijuana use, depression, and suicidal ideation/attempt. He is currently managed on Seroquel 500 mg at bedtime, hydroxyzine 25 mg 3 times a day as needed, and Remeron 15 mg nightly.  He informed provider that he takes his medications periodically and notes that recently he has not been sleeping well.   Today patient is well groomed, pleasant, cooperative, engaged in conversation, and maintained eye contact.  He notes that overall he is doing well and notes that he has minimal anxiety and depression. Provider conducted a GAD 7 and patient scored a 2. Provider also conducted a PHQ 9 and patient scored a 4. He informed Clinical research associate that he takes his medications periodically and is now only sleeping 4 hours nightly. Provider asked patient why he decided to not take his medications as prescribed and he noted that he was unsure. Patient was seen with his mother who noted that he plays video games at night which distracts him from taking his medications.  Provider discussed the importance of sleep hygiene. Patient endorsed understanding.   Patient  reports that he discontinued his Nicorette Gum as he found it eneffective. He noted that he now smokes two cigarettes a day. He also noted that  he has reduced his consumption of marijuana and only smokes twice per week.  He denies SI/HI/VAH or paranoia.   No medication adjustments made today. He is agreeable to continue all medications as prescribed and follow up with out patient counseling for therapy. No other concerns noted at this time.    Visit Diagnosis:    ICD-10-CM   1. Recurrent major depressive disorder, in partial remission (HCC)  F33.41 hydrOXYzine (ATARAX/VISTARIL) 25 MG tablet    mirtazapine (REMERON) 15 MG tablet    QUEtiapine (SEROQUEL) 200 MG tablet    QUEtiapine (SEROQUEL) 300 MG tablet  2. Brief psychotic disorder (HCC)  F23 QUEtiapine (SEROQUEL) 200 MG tablet    QUEtiapine (SEROQUEL) 300 MG tablet    Past Psychiatric History: Brief Psychotic disorder, marijuana use. depression, Suicidal Ideation/Attempt  Past Medical History:  Past Medical History:  Diagnosis Date  . Depression   . 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 Sickle cell trait (HCC)    No past surgical history on file.  Family Psychiatric History: Denies  Family History:  Family History  Problem Relation Age of Onset  . Healthy Mother   . Healthy Father     Social History:  Social History   Socioeconomic History  . Marital status: Single    Spouse name: Not on file  . Number of children: Not on file  . Years of education: Not  on file  . Highest education level: Not on file  Occupational History  . 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 Smoking status: Current Some Day Smoker    Packs/day: 0.10    Years: 5.00    Pack years: 0.50    Types: Cigarettes    Start date: 2016  . Smokeless tobacco: Never Used  Vaping Use  . Vaping Use: Not on file  Substance and Sexual Activity  . Alcohol use: No  . Drug use: Not Currently    Types: Marijuana  .  Sexual activity: Not Currently    Partners: Female    Birth control/protection: Condom  Other Topics Concern  . 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 Difficulty of Paying Living Expenses: Not hard at all  Food Insecurity: No Food Insecurity  . Worried About Programme researcher, broadcasting/film/video in the Last Year: Never true  . Ran Out of Food in the Last Year: Never true  Transportation Needs: No Transportation Needs  . Lack of Transportation (Medical): No  . Lack of Transportation (Non-Medical): No  Physical Activity: Insufficiently Active  . Days of Exercise per Week: 3 days  . Minutes of Exercise per Session: 30 min  Stress: Stress Concern Present  . Feeling of Stress : To some extent  Social Connections: Socially Isolated  . Frequency of Communication with Friends and Family: Twice a week  . Frequency of Social Gatherings with Friends and Family: Once a week  . Attends Religious Services: Never  . Active Member of Clubs or Organizations: No  . Attends Banker Meetings: Never  . Marital Status: Never married    Allergies: No Known Allergies  Metabolic Disorder Labs: Lab Results  Component Value Date   HGBA1C 6.1 (H) 07/16/2019   MPG 128.37 07/16/2019   Lab Results  Component Value Date   PROLACTIN 8.6 07/16/2019   Lab Results  Component Value Date   CHOL 135 07/16/2019   TRIG 42 07/16/2019   HDL 48 07/16/2019   CHOLHDL 2.8 07/16/2019   VLDL 8 07/16/2019   LDLCALC 79 07/16/2019   Lab Results  Component Value Date   TSH 1.009 07/10/2019    Therapeutic Level Labs: No results found for: LITHIUM No results found for: VALPROATE No components found for:  CBMZ  Current Medications: Current Outpatient Medications  Medication Sig Dispense Refill  . hydrOXYzine (ATARAX/VISTARIL) 25 MG tablet Take 1 tablet (25 mg total) by mouth 3 (three) times daily  as needed for anxiety. 90 tablet 2  . mirtazapine (REMERON) 15 MG tablet Take 1 tablet (15 mg total) by mouth at bedtime. 30 tablet 2  . QUEtiapine (SEROQUEL) 200 MG tablet Take 1 tablet (200 mg total) by mouth at bedtime. 30 tablet 2  . QUEtiapine (SEROQUEL) 300 MG tablet Take 1 tablet (300 mg total) by mouth at bedtime. 30 tablet 2   No current facility-administered medications for this visit.     Musculoskeletal: Strength & Muscle Tone: within normal limits Gait & Station: normal Patient leans: N/A  Psychiatric Specialty Exam: Review of Systems  Blood pressure 115/74, pulse 71, height 5\' 4"  (1.626 m), weight 111 lb (50.3 kg), SpO2 94 %.Body mass index is 19.05 kg/m.  General Appearance: Well Groomed  Eye Contact:  Good  Speech:  Clear and Coherent and Normal Rate  Volume:  Normal  Mood:  Euthymic  Affect:  Congruent  Thought Process:  Coherent, Goal Directed and Linear  Orientation:  Full (Time, Place, and Person)  Thought Content: WDL and Logical   Suicidal Thoughts:  No  Homicidal Thoughts:  No  Memory:  Immediate;   Good Recent;   Good Remote;   Good  Judgement:  Good  Insight:  Good  Psychomotor Activity:  Normal  Concentration:  Concentration: Good and Attention Span: Good  Recall:  Good  Fund of Knowledge: Good  Language: Good  Akathisia:  No  Handed:  Right  AIMS (if indicated): not done  Assets:  Communication Skills Desire for Improvement Financial Resources/Insurance Housing Social Support  ADL's:  Intact  Cognition: WNL  Sleep:  Poor   Screenings: AUDIT   Flowsheet Row [facility]  Alcohol Use Disorder Identification Test Final Score (AUDIT) 0    GAD-7   Flowsheet Row Clinical Support from 01/31/2020 in [facility]  Total GAD-7 Score 2    PHQ2-9   Flowsheet Row Clinical Support from 01/31/2020 in [facility]  Counselor from 01/16/2020 in [facility] Office Visit from 10/13/2019 in [facility] Office Visit from 05/18/2019 in [facility] Office Visit from 09/29/2018 in [facility]  PHQ-2 Total Score 1 2 4  0 0  PHQ-9 Total Score 4 8 5  -- --       Assessment and Plan: Patient reports that he has not been sleeping well since taking his medications periodically. He is agreeable to take medications as prescribed. No medication changes made today.     1. Recurrent major depressive disorder, in partial remission (HCC)  Continue- mirtazapine (REMERON) 15 MG tablet; Take 1 tablet (15 mg total) by mouth at bedtime.  Dispense: 30 tablet; Refill: 2 Continue- hydrOXYzine (ATARAX/VISTARIL) 25 MG tablet; Take 1 tablet (25 mg total) by mouth 3 (three) times daily as needed for anxiety.  Dispense: 90 tablet; Refill: 2 Continue- QUEtiapine (SEROQUEL) 200 MG tablet; Take 1 tablet (200 mg total) by mouth at bedtime.  Dispense: 30 tablet; Refill: 2 Continue- QUEtiapine (SEROQUEL) 300 MG tablet; Take 1 tablet (300 mg total) by mouth at bedtime.  Dispense: 30 tablet; Refill: 2  2. Brief psychotic disorder (HCC)  Continue- QUEtiapine (SEROQUEL) 200 MG tablet; Take 1 tablet (200 mg total) by mouth at bedtime.  Dispense: 30 tablet; Refill: 2 Continue- QUEtiapine (SEROQUEL) 300 MG tablet; Take 1 tablet (300 mg total) by mouth at bedtime.  Dispense: 30 tablet; Refill: 2  Follow up in 3 months Follow up with therapy  , NP 01/31/2020, 9:02 AM

## 2020-03-13 ENCOUNTER — Other Ambulatory Visit: Payer: Self-pay

## 2020-03-13 ENCOUNTER — Ambulatory Visit (HOSPITAL_COMMUNITY): Payer: BC Managed Care – PPO | Admitting: Licensed Clinical Social Worker

## 2020-03-13 ENCOUNTER — Ambulatory Visit (INDEPENDENT_AMBULATORY_CARE_PROVIDER_SITE_OTHER): Payer: BC Managed Care – PPO | Admitting: Clinical

## 2020-03-13 DIAGNOSIS — F3341 Major depressive disorder, recurrent, in partial remission: Secondary | ICD-10-CM | POA: Diagnosis not present

## 2020-03-13 NOTE — Progress Notes (Signed)
   THERAPIST PROGRESS NOTE  Session Time: 20 minutes  Participation Level: Minimal  Behavioral Response: CasualAlertEuthymic  Type of Therapy: Individual Therapy  Treatment Goals addressed: Coping  Interventions: CBT  Summary:  Dustin Tate is a 25 y.o. male who presents for the scheduled session oriented times five, appropriately dressed and friendly. Client denied hallucinations and delusions. Client reported on today he is doing fairly well. Client presents for initial session to engage in therapy services. Client reported so far his medication has been working well to manage his depressive symptoms. Client reported his mother mainly has pushed him to engage in therapy and he does not think he needs it. Client reported his mom believes he is too much to himself. Client reported he does not interact with anyone socially and spends his time playing video games and sleeping. Client reported he is content with how things are for him. Client reported he has been in therapy before and found it to be beneficial.     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 Therapist engaged with the client asking open ended questions about his current mental health symptoms. Therapist asked the client open ended questions about what he would like to work on in his therapy sessions. Therapist used CBT to discuss "thinking feeling connection". Therapist provided the client with a psychoeducational worksheet to read about deciphering between thoughts and actual feelings. Client was scheduled for next appointment.   Plan: Return again in 5 weeks for individual therapy.  Diagnosis: Recurrent major depressive disorder, in partial remission  Neena Rhymes Caylah Plouff, LCSW 03/13/2020

## 2020-04-04 ENCOUNTER — Ambulatory Visit (INDEPENDENT_AMBULATORY_CARE_PROVIDER_SITE_OTHER): Payer: BC Managed Care – PPO | Admitting: Psychiatry

## 2020-04-04 ENCOUNTER — Ambulatory Visit (INDEPENDENT_AMBULATORY_CARE_PROVIDER_SITE_OTHER): Payer: BC Managed Care – PPO | Admitting: Clinical

## 2020-04-04 ENCOUNTER — Encounter (HOSPITAL_COMMUNITY): Payer: Self-pay | Admitting: Psychiatry

## 2020-04-04 ENCOUNTER — Other Ambulatory Visit: Payer: Self-pay

## 2020-04-04 VITALS — BP 117/62 | HR 97 | Ht 64.0 in | Wt 114.8 lb

## 2020-04-04 DIAGNOSIS — F3341 Major depressive disorder, recurrent, in partial remission: Secondary | ICD-10-CM

## 2020-04-04 DIAGNOSIS — F1994 Other psychoactive substance use, unspecified with psychoactive substance-induced mood disorder: Secondary | ICD-10-CM | POA: Diagnosis not present

## 2020-04-04 MED ORDER — QUETIAPINE FUMARATE 200 MG PO TABS: 200.0000 mg | ORAL_TABLET | Freq: Every day | ORAL | 2 refills | Status: DC

## 2020-04-04 MED ORDER — QUETIAPINE FUMARATE 300 MG PO TABS
300.0000 mg | ORAL_TABLET | Freq: Every day | ORAL | 2 refills | Status: DC
Start: 2020-04-04 — End: 2020-06-28

## 2020-04-04 NOTE — Progress Notes (Signed)
   THERAPIST PROGRESS NOTE  Session Time: 25 minutes  Participation Level: Active  Behavioral Response: CasualAlertDepressed  Type of Therapy: Individual Therapy  Treatment Goals addressed: Diagnosis: Depression  Interventions: CBT  Summary:  Dustin Tate is a 25 y.o. male who presents for the scheduled session oriented times five, appropriately dressed, and friendly. Client denied hallucinations and delusions. Client presented for session after medication management appointment with the psychiatrist. Client reported on today he has been maintaining well. Client reported for the past two days he has had insomnia for an unidentifiable reason other than he cannot sleep. Client reported he has had a lot of energy despite having no sleep. Client reported since the last session he has begun to look for work in a warehouse or company where he would have minimal interaction with the public. Client reported he needs money but he does feel ready to go back to work. 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 Client reported he does spend some time with his brother and a few people he considers friends. Client reported he wants his goal to be able to scale back on his medication and eventually not have to take anything. Client reported his depression has been very minimal to none over the past month.   Flowsheet Row Clinical Support from 04/04/2020 in [facility]  PHQ-9 Total Score 2         Suicidal/Homicidal: Nowithout intent/plan  Therapist Response: Therapist began the session checking in and asking the client how he has been doing since last seen. Therapist asked the client open ended question about his depressive symptoms and changes that he is working on making in regards to his daily routine. Therapist used CBT to review the previous session homework assignment and  discuss his goals for therapy. Therapist assigned the client homework to read the worksheet again and make notes of how it pertains to him. Client was scheduled for next appointment.    Plan: Return again in 4 weeks for individual therapy.  Diagnosis: Recurrent major depressive disorder, in partial remission  Neena Rhymes Gwynne Kemnitz, LCSW 04/04/2020

## 2020-04-04 NOTE — Progress Notes (Signed)
BH MD/PA/NP OP Progress Note Virtual Visit via Video Note  I connected with Dustin Tate on 04/04/20 at  1:00 PM EST by a video enabled telemedicine application and verified that I am speaking with the correct person using two identifiers.  Location: Patient: Home Provider: Clinic   I discussed the limitations of evaluation and management by telemedicine and the availability of in person appointments. The patient expressed understanding and agreed to proceed.  I provided of non-face-to-face time during this encounter.     04/04/2020 1:43 PM Dustin Tate  MRN:  [id number]  Chief Complaint: " I did something stupid with the hydroxyzine and I only take my other medications sometimes" Chief Complaint    Medication Management         HPI: 25 year old male seen today for follow up psychiatric evaluation. He has a psychiatric history of brief psychotic disorder, marijuana use, depression, and suicidal ideation/attempt. He is currently managed on Seroquel 500 mg at bedtime, hydroxyzine 25 mg 3 times a day as needed, and Remeron 15 mg nightly.  He informed provider that he discontinued hydroxyzine and notes that he takes his other medications periodically.  Today patient he is well groomed, pleasant, cooperative, engaged in conversation, and maintained eye contact.  He informed provider that recently he smoked marijuana with hydroxyzine and notes that he got overheated and felt like he was going to die.  He notes that since then he discontinued his hydroxyzine.  He informed Clinical research associate that he continues to smoke marijuana daily.  He endorses fluctuations in mood, irritability, distractibility, and racing thoughts.  Today he informed provider that he has very minimal anxiety and depression. Provider conducted a GAD 7 and patient scored a 2, at last visit he scored a 2. Provider also conducted a PHQ 9 and patient scored a 2, at his last visit he scored a 4.  Patient notes at times he  sleeps for over 14 hours however notes that for the last 2 days he has not slept at all.  He notes that he has increased energy as well. Today he denies SI/HI/AVH or paranoia.  Patient informed Clinical research associate that he continues to play his video games to entertain himself.  He notes that he sits in front of the screen for 5 hours and notes that it is better than he was sitting in front of the screen for 13 hours.  He notes that during the games he sometimes has heart palpitations because he is on drawn into the game.    Today he is agreeable to discontinuing mirtazapine and hydroxyzine as he notes that he no longer believes he needs them. He will continue all medications as prescribed and follow up with out patient counseling for therapy. No other concerns noted at this time.    Visit Diagnosis:    ICD-10-CM   1. Substance induced mood disorder (HCC)  F19.94 QUEtiapine (SEROQUEL) 300 MG tablet    QUEtiapine (SEROQUEL) 200 MG tablet  2. Recurrent major depressive disorder, in partial remission (HCC)  F33.41 QUEtiapine (SEROQUEL) 300 MG tablet    QUEtiapine (SEROQUEL) 200 MG tablet    Past Psychiatric History: Brief Psychotic disorder, marijuana use. depression, Suicidal Ideation/Attempt  Past Medical History:  Past Medical History:  Diagnosis Date  . Depression   . 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 Sickle cell trait (HCC)    No past surgical history on file.  Family Psychiatric History: Denies  Family History:  Family History  Problem Relation  Age of Onset  . Healthy Mother   . Healthy Father     Social History:  Social History   Socioeconomic History  . Marital status: Single    Spouse name: Not on file  . Number of children: Not on file  . Years of education: Not on file  . Highest education level: Not on file  Occupational History  . 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 Smoking status: Current Some Day Smoker    Packs/day: 0.10     Years: 5.00    Pack years: 0.50    Types: Cigarettes    Start date: 2016  . Smokeless tobacco: Never Used  Vaping Use  . Vaping Use: Not on file  Substance and Sexual Activity  . Alcohol use: No  . Drug use: Not Currently    Types: Marijuana  . Sexual activity: Not Currently    Partners: Female    Birth control/protection: Condom  Other Topics Concern  . 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 Difficulty of Paying Living Expenses: Not hard at all  Food Insecurity: No Food Insecurity  . Worried About Programme researcher, broadcasting/film/video in the Last Year: Never true  . Ran Out of Food in the Last Year: Never true  Transportation Needs: No Transportation Needs  . Lack of Transportation (Medical): No  . Lack of Transportation (Non-Medical): No  Physical Activity: Insufficiently Active  . Days of Exercise per Week: 3 days  . Minutes of Exercise per Session: 30 min  Stress: Stress Concern Present  . Feeling of Stress : To some extent  Social Connections: Socially Isolated  . Frequency of Communication with Friends and Family: Twice a week  . Frequency of Social Gatherings with Friends and Family: Once a week  . Attends Religious Services: Never  . Active Member of Clubs or Organizations: No  . Attends Banker Meetings: Never  . Marital Status: Never married    Allergies: No Known Allergies  Metabolic Disorder Labs: Lab Results  Component Value Date   HGBA1C 6.1 (H) 07/16/2019   MPG 128.37 07/16/2019   Lab Results  Component Value Date   PROLACTIN 8.6 07/16/2019   Lab Results  Component Value Date   CHOL 135 07/16/2019   TRIG 42 07/16/2019   HDL 48 07/16/2019   CHOLHDL 2.8 07/16/2019   VLDL 8 07/16/2019   LDLCALC 79 07/16/2019   Lab Results  Component Value Date   TSH 1.009 07/10/2019    Therapeutic Level Labs: No results found for: LITHIUM No  results found for: VALPROATE No components found for:  CBMZ  Current Medications: Current Outpatient Medications  Medication Sig Dispense Refill  . QUEtiapine (SEROQUEL) 200 MG tablet Take 1 tablet (200 mg total) by mouth at bedtime. 30 tablet 2  . QUEtiapine (SEROQUEL) 300 MG tablet Take 1 tablet (300 mg total) by mouth at bedtime. 30 tablet 2   No current facility-administered medications for this visit.     Musculoskeletal: Strength & Muscle Tone: within normal limits Gait & Station: normal Patient leans: N/A  Psychiatric Specialty Exam: Review of Systems  Blood pressure 117/62, pulse 97, height 5\' 4"  (1.626 m), weight 114 lb 12.8 oz (52.1 kg), SpO2 100 %.Body mass index is 19.71 kg/m.  General Appearance: Well  Groomed  Eye Contact:  Good  Speech:  Clear and Coherent and Normal Rate  Volume:  Normal  Mood:  Euthymic  Affect:  Congruent  Thought Process:  Coherent, Goal Directed and Linear  Orientation:  Full (Time, Place, and Person)  Thought Content: WDL and Logical   Suicidal Thoughts:  No  Homicidal Thoughts:  No  Memory:  Immediate;   Good Recent;   Good Remote;   Good  Judgement:  Good  Insight:  Good  Psychomotor Activity:  Normal  Concentration:  Concentration: Good and Attention Span: Good  Recall:  Good  Fund of Knowledge: Good  Language: Good  Akathisia:  No  Handed:  Right  AIMS (if indicated): not done  Assets:  Communication Skills Desire for Improvement Financial Resources/Insurance Housing Social Support  ADL's:  Intact  Cognition: WNL  Sleep:  Poor   Screenings: AUDIT   Flowsheet Row [facility]  Alcohol Use Disorder Identification Test Final Score (AUDIT) 0    GAD-7   Flowsheet Row Clinical Support from 04/04/2020 in [facility] Clinical Support from 01/31/2020 in [facility]  Total GAD-7 Score 2 2     PHQ2-9   Flowsheet Row Clinical Support from 04/04/2020 in [facility] Counselor from 03/13/2020 in [facility] Clinical Support from 01/31/2020 in [facility] Counselor from 01/16/2020 in [facility] Office Visit from 10/13/2019 in [facility]  PHQ-2 Total Score 0 1 1 2 4   PHQ-9 Total Score 2 9 4 8 5     Flowsheet Row Clinical Support from 04/04/2020 in [facility]  C-SSRS RISK CATEGORY No Risk       Assessment and Plan: Patient notes that he has very little anxiety and depression.  He does endorse symptoms of hypomania such as distractibility, racing thoughts, fluctuations in mood, and irritability.  Today he notes that he wants to discontinue hydroxyzine and mirtazapine.  He will continue all other medications as prescribed.  1. Recurrent major depressive disorder, in partial remission (HCC) Continue- QUEtiapine (SEROQUEL) 200 MG tablet; Take 1 tablet (200 mg total) by mouth at bedtime.  Dispense: 30 tablet; Refill: 2 Continue- QUEtiapine (SEROQUEL) 300 MG tablet; Take 1 tablet (300 mg total) by mouth at bedtime.  Dispense: 30 tablet; Refill: 2  2. Substance induced mood disorder (HCC) Continue- QUEtiapine (SEROQUEL) 200 MG tablet; Take 1 tablet (200 mg total) by mouth at bedtime.  Dispense: 30 tablet; Refill: 2 Continue- QUEtiapine (SEROQUEL) 300 MG tablet; Take 1 tablet (300 mg total) by mouth at bedtime.  Dispense: 30 tablet; Refill: 2   Follow up in 3 months Follow up with therapy  06/04/2020, NP 04/04/2020, 1:43 PM

## 2020-04-09 ENCOUNTER — Telehealth (HOSPITAL_COMMUNITY): Payer: Self-pay | Admitting: *Deleted

## 2020-04-09 NOTE — Telephone Encounter (Signed)
BC/BS INGENIO Rx   QUEtiapine (SEROQUEL) 300 MG tablet           APPROVED: REFERENCE # : [id number]  EFFECTIVE: 04/09/20 ---- 04/09/21

## 2020-04-11 ENCOUNTER — Telehealth (HOSPITAL_COMMUNITY): Payer: Self-pay | Admitting: *Deleted

## 2020-04-11 NOTE — Telephone Encounter (Signed)
QUEtiapine (SEROQUEL) 200 MG tablet HAS APPROVED CLAIM ON FILE PER  HEALTH[employer] BC/BS INGENIOU Rx

## 2020-04-30 ENCOUNTER — Ambulatory Visit (HOSPITAL_COMMUNITY): Payer: Self-pay | Admitting: Clinical

## 2020-06-04 ENCOUNTER — Other Ambulatory Visit: Payer: Self-pay

## 2020-06-04 ENCOUNTER — Ambulatory Visit (INDEPENDENT_AMBULATORY_CARE_PROVIDER_SITE_OTHER): Payer: Medicaid Other | Admitting: Family Medicine

## 2020-06-04 ENCOUNTER — Encounter: Payer: Self-pay | Admitting: Family Medicine

## 2020-06-04 VITALS — BP 123/72 | HR 92 | Ht 64.0 in | Wt 109.0 lb

## 2020-06-04 DIAGNOSIS — F339 Major depressive disorder, recurrent, unspecified: Secondary | ICD-10-CM | POA: Diagnosis not present

## 2020-06-04 DIAGNOSIS — Z79899 Other long term (current) drug therapy: Secondary | ICD-10-CM | POA: Diagnosis not present

## 2020-06-04 DIAGNOSIS — Z0000 Encounter for general adult medical examination without abnormal findings: Secondary | ICD-10-CM | POA: Diagnosis not present

## 2020-06-04 NOTE — Patient Instructions (Addendum)
Thank you for choosing family medicine for your health today.  As you have been on Seroquel will for a few weeks, I will check your cholesterol and a metabolic panel to check your liver and kidney and electrolyte function.  I will call you with any abnormal results.  Recommend following up with me in 6 months.  Recommended she continue to speak with your psychiatrist/therapist regularly.  Please notify us if you begin to have any thoughts of harming your self or ending your life.    If you are feeling suicidal or depression symptoms worsen please immediately go to:   24 Hour Availability [facility]  [address] [place], [place] [phone] Crisis [phone]    . If you are thinking about harming yourself or having thoughts of suicide, or if you know someone who is, seek help right away. . Call your doctor or mental health care provider. . Call 911 or go to a hospital emergency room to get immediate help, or ask a friend or family member to help you do these things. . Call the [place] National Suicide Prevention Lifeline's toll-free, 24-hour hotline at 1-800-273-TALK 5483697351) or TTY: 1-800-799-4 TTY 323-258-0352) to talk to a trained counselor. . If you are in crisis, make sure you are not left alone.  . If someone else is in crisis, make sure he or she is not left alone   [facility] [employer] (Domestic Violence, Rape & Victim Assistance [phone]  [facility] [employer] [facility]    (ONLY from 8am-4pm)    [phone]  [facility] (24/7)   [phone]  [place] National Suicide Hotline   [phone] Len Childs)

## 2020-06-04 NOTE — Progress Notes (Signed)
    SUBJECTIVE:   CHIEF COMPLAINT / HPI: annual physical   Dustin Tate is 25 y.o. male presenting for annual physical. He estimates taking Seroquel 3 times per week to help with sleep. Sees a psychiatrist [address] [facility].  Patient denies any audio or visual hallucinations.  Patient states that he is mostly able to sleep without taking the medication.  Patient reports that he is a private person tries too not talk to many individuals.  He does find therapy helpful.  He denies any blurry vision, he denies constipation, has no issues with swallowing.   HM  Patient is recommended for HPV vaccination. He is agreeable to  having the vaccine.   PERTINENT  PMH / PSH:  Substance induced mood disorder  MDD  Tobacco Use   OBJECTIVE:   BP 123/72   Pulse 92   Ht 5\' 4"  (1.626 m)   Wt 109 lb (49.4 kg)   SpO2 99%   BMI 18.71 kg/m   General: male appearing stated age in no acute distress HEENT: MMM, no oral lesions noted,Neck non-tender without lymphadenopathy Cardio: Normal S1 and S2, no S3 or S4. Rhythm is regular. No murmurs or rubs.  Bilateral radial pulses palpable Pulm: Clear to auscultation bilaterally, no crackles, wheezing, or diminished breath sounds. Normal respiratory effort, stable on RA Abdomen: Bowel sounds normal. Abdomen soft and non-tender.  Extremities: No peripheral edema. Warm/ well perfused.  Neuro: pt alert and oriented x4  Psych: stated mood "ok", flat affect, no psychomotor retardation, normal rate of speech   ASSESSMENT/PLAN:   Depression, recurrent (HCC) Patient with flat affect today. Denies SI, AV hallucinations. Reports only taking seroquel for sleep.  - patient to continue with counseling sessions  - continue seroquel as prescribed  - CMP and lipid panel today for long term antipsychotic medication    Well adult exam Patient with no complaints or health concerns today.  Recommended HPV vaccine. Patient agreeable to this.  Will plan to  revisit vaccine at next visit as patient left without being able to receive vaccine after labs were drawn.      , MD [facility]

## 2020-06-05 LAB — LIPID PANEL
Chol/HDL Ratio: 3 ratio (ref 0.0–5.0)
Cholesterol, Total: 137 mg/dL (ref 100–199)
HDL: 46 mg/dL (ref >39)
LDL Chol Calc (NIH): 80 mg/dL (ref 0–99)
Triglycerides: 49 mg/dL (ref 0–149)
VLDL Cholesterol Cal: 11 mg/dL (ref 5–40)

## 2020-06-05 LAB — COMPREHENSIVE METABOLIC PANEL
ALT: 48 IU/L — ABNORMAL HIGH (ref 0–44)
AST: 24 IU/L (ref 0–40)
Albumin/Globulin Ratio: 2.8 — ABNORMAL HIGH (ref 1.2–2.2)
Albumin: 5 g/dL (ref 4.1–5.2)
Alkaline Phosphatase: 54 IU/L (ref 44–121)
BUN/Creatinine Ratio: 9 (ref 9–20)
BUN: 8 mg/dL (ref 6–20)
Bilirubin Total: 0.4 mg/dL (ref 0.0–1.2)
CO2: 24 mmol/L (ref 20–29)
Calcium: 9.7 mg/dL (ref 8.7–10.2)
Chloride: 103 mmol/L (ref 96–106)
Creatinine, Ser: 0.9 mg/dL (ref 0.76–1.27)
Globulin, Total: 1.8 g/dL (ref 1.5–4.5)
Glucose: 92 mg/dL (ref 65–99)
Potassium: 4.4 mmol/L (ref 3.5–5.2)
Sodium: 141 mmol/L (ref 134–144)
Total Protein: 6.8 g/dL (ref 6.0–8.5)
eGFR: 123 mL/min/{1.73_m2} (ref >59)

## 2020-06-06 NOTE — Assessment & Plan Note (Signed)
Patient with no complaints or health concerns today.  Recommended HPV vaccine. Patient agreeable to this.  Will plan to revisit vaccine at next visit as patient left without being able to receive vaccine after labs were drawn.

## 2020-06-06 NOTE — Assessment & Plan Note (Signed)
Patient with flat affect today. Denies SI, AV hallucinations. Reports only taking seroquel for sleep.  - patient to continue with counseling sessions  - continue seroquel as prescribed  - CMP and lipid panel today for long term antipsychotic medication

## 2020-06-13 ENCOUNTER — Encounter: Payer: Self-pay | Admitting: Family Medicine

## 2020-06-27 ENCOUNTER — Encounter (HOSPITAL_COMMUNITY): Payer: BC Managed Care – PPO | Admitting: Psychiatry

## 2020-06-28 ENCOUNTER — Encounter (HOSPITAL_COMMUNITY): Payer: Self-pay | Admitting: Psychiatry

## 2020-06-28 ENCOUNTER — Other Ambulatory Visit: Payer: Self-pay

## 2020-06-28 ENCOUNTER — Ambulatory Visit (INDEPENDENT_AMBULATORY_CARE_PROVIDER_SITE_OTHER): Payer: Medicaid Other | Admitting: Psychiatry

## 2020-06-28 DIAGNOSIS — F1994 Other psychoactive substance use, unspecified with psychoactive substance-induced mood disorder: Secondary | ICD-10-CM | POA: Diagnosis not present

## 2020-06-28 DIAGNOSIS — F3341 Major depressive disorder, recurrent, in partial remission: Secondary | ICD-10-CM | POA: Diagnosis not present

## 2020-06-28 MED ORDER — QUETIAPINE FUMARATE 300 MG PO TABS: 300.0000 mg | ORAL_TABLET | Freq: Every day | ORAL | 2 refills | Status: DC

## 2020-06-28 MED ORDER — QUETIAPINE FUMARATE 200 MG PO TABS: 200.0000 mg | ORAL_TABLET | Freq: Every day | ORAL | 2 refills | Status: DC

## 2020-06-28 NOTE — Progress Notes (Signed)
[facility] MD/PA/NP OP Progress Note      06/28/2020 4:09 PM Dustin Tate  MRN:  [id number]  Chief Complaint: "I take my medications maybe 3 times a week" Chief Complaint    Medication Management         HPI: 25 year old male seen today for follow up psychiatric evaluation. He has a psychiatric history of brief psychotic disorder, marijuana use, depression, and suicidal ideation/attempt. He is currently managed on Seroquel 500 mg at bedtime.  Today He informed provider that he takes his  medications periodically.  Today patient he is well groomed, pleasant, cooperative, engaged in conversation, and had poor eye.  During assessment patient affect was flat and his speech was slow and had a decreased volume.  He informed provider he only takes his medications periodically.  He notes that his anxiety and depression continues to be minimal.  Provider conducted a GAD-7 and patient scored a 4, at his last visit he scored 2.  Provider also conducted a PHQ-9 and patient scored a 5, at his last visit he scored a 2.  He endorses adequate sleep and appetite.  He denies SI/HI/VAH, mania, paranoia.  Patient notes that he continues to smoke marijuana.  He informed provider that he does it approximately 3 times a week. Provider informed patient that his marijuana use can worsen his mental health conditions.  He endorsed understanding however notes that it is helpful to him.  Patient was seen with his mother who notes that he is more irritable and has fluctuations in his mood.  She notes that he prefers to stay isolated at and in his room playing video games.  She notes that he was pursuing a degree at [employer] and gaining however stopped going to school because he lacks motivation to do things.  Provider asked patient if he was interested in restarting college and he notes that at this time he is not.    Provider asked patient if he had followed up with his therapist and he notes that he missed his appointment.  He  notes that he may reschedule in the future.  Provider recommended starting an antidepressant to help increase patient's motivation however he notes that he does not want to start another antidepressant.  He notes he just wants to take Seroquel as prescribed.  No other concerns noted at this time    Visit Diagnosis:    ICD-10-CM   1. Recurrent major depressive disorder, in partial remission (HCC)  F33.41 QUEtiapine (SEROQUEL) 200 MG tablet    QUEtiapine (SEROQUEL) 300 MG tablet  2. Substance induced mood disorder (HCC)  F19.94 QUEtiapine (SEROQUEL) 200 MG tablet    QUEtiapine (SEROQUEL) 300 MG tablet    Past Psychiatric History: Brief Psychotic disorder, marijuana use. depression, Suicidal Ideation/Attempt  Past Medical History:  Past Medical History:  Diagnosis Date  . Depression   . 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 Sickle cell trait (HCC)    No past surgical history on file.  Family Psychiatric History: Denies  Family History:  Family History  Problem Relation Age of Onset  . Healthy Mother   . Healthy Father     Social History:  Social History   Socioeconomic History  . Marital status: Single    Spouse name: Not on file  . Number of children: Not on file  . Years of education: Not on file  . Highest education level: Not on file  Occupational History  . Occupation: Health and safety inspector:  [employer]    Comment: Studies Primary school teacher  Tobacco Use  . Smoking status: Current Some Day Smoker    Packs/day: 0.10    Years: 5.00    Pack years: 0.50    Types: Cigarettes    Start date: 2016  . Smokeless tobacco: Never Used  Vaping Use  . Vaping Use: Not on file  Substance and Sexual Activity  . Alcohol use: No  . Drug use: Not Currently    Types: Marijuana  . Sexual activity: Not Currently    Partners: Female    Birth control/protection: Condom  Other Topics Concern  . Not on file  Social History Narrative   Student at [employer]. Studies Primary school teacher   Social Determinants of  Health   Financial Resource Strain: Low Risk   . Difficulty of Paying Living Expenses: Not hard at all  Food Insecurity: No Food Insecurity  . Worried About Programme researcher, broadcasting/film/video in the Last Year: Never true  . Ran Out of Food in the Last Year: Never true  Transportation Needs: No Transportation Needs  . Lack of Transportation (Medical): No  . Lack of Transportation (Non-Medical): No  Physical Activity: Insufficiently Active  . Days of Exercise per Week: 3 days  . Minutes of Exercise per Session: 30 min  Stress: Stress Concern Present  . Feeling of Stress : To some extent  Social Connections: Socially Isolated  . Frequency of Communication with Friends and Family: Twice a week  . Frequency of Social Gatherings with Friends and Family: Once a week  . Attends Religious Services: Never  . Active Member of Clubs or Organizations: No  . Attends Banker Meetings: Never  . Marital Status: Never married    Allergies: No Known Allergies  Metabolic Disorder Labs: Lab Results  Component Value Date   HGBA1C 6.1 (H) 07/16/2019   MPG 128.37 07/16/2019   Lab Results  Component Value Date   PROLACTIN 8.6 07/16/2019   Lab Results  Component Value Date   CHOL 137 06/04/2020   TRIG 49 06/04/2020   HDL 46 06/04/2020   CHOLHDL 3.0 06/04/2020   VLDL 8 07/16/2019   LDLCALC 80 06/04/2020   LDLCALC 79 07/16/2019   Lab Results  Component Value Date   TSH 1.009 07/10/2019    Therapeutic Level Labs: No results found for: LITHIUM No results found for: VALPROATE No components found for:  CBMZ  Current Medications: Current Outpatient Medications  Medication Sig Dispense Refill  . QUEtiapine (SEROQUEL) 200 MG tablet Take 1 tablet (200 mg total) by mouth at bedtime. 30 tablet 2  . QUEtiapine (SEROQUEL) 300 MG tablet Take 1 tablet (300 mg total) by mouth at bedtime. 30 tablet 2   No current facility-administered medications for this visit.     Musculoskeletal: Strength &  Muscle Tone: within normal limits Gait & Station: normal Patient leans: N/A  Psychiatric Specialty Exam: Review of Systems  Blood pressure 128/72, pulse 68, height 5\' 4"  (1.626 m), weight 109 lb (49.4 kg).Body mass index is 18.71 kg/m.  General Appearance: Well Groomed  Eye Contact:  Good  Speech:  Clear and Coherent and Slow  Volume:  Decreased  Mood:  Euthymic  Affect:  Blunt and Flat  Thought Process:  Coherent, Goal Directed and Linear  Orientation:  Full (Time, Place, and Person)  Thought Content: WDL and Logical   Suicidal Thoughts:  No  Homicidal Thoughts:  No  Memory:  Immediate;   Good Recent;   Good Remote;  Good  Judgement:  Good  Insight:  Good  Psychomotor Activity:  Normal  Concentration:  Concentration: Good and Attention Span: Good  Recall:  Good  Fund of Knowledge: Good  Language: Good  Akathisia:  No  Handed:  Right  AIMS (if indicated): not done  Assets:  Communication Skills Desire for Improvement Financial Resources/Insurance Housing Social Support  ADL's:  Intact  Cognition: WNL  Sleep:  Good   Screenings: AUDIT   Flowsheet Row [facility]  Alcohol Use Disorder Identification Test Final Score (AUDIT) 0    GAD-7   Flowsheet Row Clinical Support from 06/28/2020 in [facility] Clinical Support from 04/04/2020 in [facility] Clinical Support from 01/31/2020 in [facility]  Total GAD-7 Score 4 2 2     PHQ2-9   Flowsheet Row Clinical Support from 06/28/2020 in [facility] Office Visit from 06/04/2020 in [facility] Clinical Support from 04/04/2020 in [facility] Counselor from 03/13/2020 in [facility] Clinical Support from 01/31/2020 in [facility]  PHQ-2 Total  Score 1 0 0 1 1  PHQ-9 Total Score 5 2 2 9 4     Flowsheet Row Clinical Support from 04/04/2020 in [facility]  C-SSRS RISK CATEGORY No Risk       Assessment and Plan: Today patient affect is flat, his speech is blunted, he however denies symptoms of anxiety, depression, or psychosis.  At this time he does not want to start an antidepressant to help manage mood.  He will continue Seroquel as prescribed.  1. Recurrent major depressive disorder, in partial remission (HCC) Continue- QUEtiapine (SEROQUEL) 200 MG tablet; Take 1 tablet (200 mg total) by mouth at bedtime.  Dispense: 30 tablet; Refill: 2 Continue- QUEtiapine (SEROQUEL) 300 MG tablet; Take 1 tablet (300 mg total) by mouth at bedtime.  Dispense: 30 tablet; Refill: 2  2. Substance induced mood disorder (HCC) Continue- QUEtiapine (SEROQUEL) 200 MG tablet; Take 1 tablet (200 mg total) by mouth at bedtime.  Dispense: 30 tablet; Refill: 2 Continue- QUEtiapine (SEROQUEL) 300 MG tablet; Take 1 tablet (300 mg total) by mouth at bedtime.  Dispense: 30 tablet; Refill: 2   Follow up in 2 months Follow up with therapy  06/04/2020, NP 06/28/2020, 4:09 PM

## 2020-07-22 ENCOUNTER — Encounter (HOSPITAL_COMMUNITY): Payer: Self-pay

## 2020-07-22 ENCOUNTER — Ambulatory Visit (HOSPITAL_COMMUNITY)
Admission: EM | Admit: 2020-07-22 | Discharge: 2020-07-22 | Disposition: A | Discharge status: Home / Self Care | Payer: Medicaid Other | Attending: Internal Medicine | Admitting: Internal Medicine

## 2020-07-22 DIAGNOSIS — L739 Follicular disorder, unspecified: Secondary | ICD-10-CM

## 2020-07-22 DIAGNOSIS — Z112 Encounter for screening for other bacterial diseases: Secondary | ICD-10-CM | POA: Diagnosis not present

## 2020-07-22 LAB — POCT RAPID STREP A, ED / UC: Streptococcus, Group A Screen (Direct): NEGATIVE

## 2020-07-22 MED ORDER — ACETAMINOPHEN 325 MG PO TABS
ORAL_TABLET | ORAL | Status: AC
  Filled 2020-07-22: qty 2

## 2020-07-22 MED ORDER — DOXYCYCLINE HYCLATE 100 MG PO CAPS: 100.0000 mg | ORAL_CAPSULE | Freq: Two times a day (BID) | ORAL | 0 refills | Status: DC

## 2020-07-22 MED ORDER — ACETAMINOPHEN 325 MG PO TABS
650.0000 mg | ORAL_TABLET | Freq: Once | ORAL | Status: AC
  Administered 2020-07-22: 13:00:00 650 mg via ORAL

## 2020-07-22 NOTE — Discharge Instructions (Addendum)
-  Doxycycline twice daily for 10 days.  Make sure to wear sunscreen while spending long periods of time outside on this medication as it can increase your chance of sunburn. -This will also cover for strep throat. -In the future, if you suspect you have strep throat, seek immediate medical attention.  This needs to be treated with antibiotics to prevent complications.

## 2020-07-22 NOTE — ED Provider Notes (Signed)
[facility]    CSN: [id number] Arrival date & time: 07/22/20  1048      History   Chief Complaint Chief Complaint  Patient presents with   Rash   Headache    HPI Kiam AERO DRUMMONDS is a 25 y.o. male presenting with rash bilateral axilla, headaches, sore throat.  Medical history mood disorder, depression, nicotine dependence, suicidal ideation, and sickle cell trait. -Endorses 1 week of sore throat, subjective chills, malaise.  States he assumed he had strep throat but did not seek medical attention.  States the sore throat has largely resolved, but he still feels under the weather.  Denies cough, nausea/vomiting/diarrhea, trouble swallowing, shortness of breath. -Also states he noticed a rash in his bilateral armpits that has turned into a few lumps.  States he has not had this issue before.  Denies new products, fragrances, deodorant.  Denies drainage.  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    Sickle cell trait [facility])     Patient Active Problem List   Diagnosis Date Noted   Substance induced mood disorder (HCC) 04/04/2020   Recurrent major depressive disorder, in partial remission (HCC) 10/27/2019   Cigarette nicotine dependence without complication 10/27/2019   Depression, recurrent (HCC) 10/17/2019   Brief psychotic disorder (HCC) 07/12/2019   Suicidal ideation 07/08/2019   Suicidal ideations 07/08/2019   Well adult exam 09/18/2016   History of prematurity 09/18/2016   Sickle cell trait (HCC) 09/18/2016   Tobacco use disorder 09/18/2016   Marijuana use 09/18/2016    History reviewed. No pertinent surgical history.     Home Medications    Prior to Admission medications   Medication Sig Start Date End Date Taking? Authorizing Provider  QUEtiapine (SEROQUEL) 200 MG tablet Take 1 tablet (200 mg total) by mouth at bedtime. 06/28/20   Shanna Cisco, NP  QUEtiapine (SEROQUEL) 300 MG tablet Take 1 tablet (300  mg total) by mouth at bedtime. 06/28/20   Shanna Cisco, NP    Family History Family History  Problem Relation Age of Onset   Healthy Mother    Healthy Father     Social History Social History   Tobacco Use   Smoking status: Some Days    Packs/day: 0.10    Years: 5.00    Pack years: 0.50    Types: Cigarettes    Start date: 2016   Smokeless tobacco: Never  Substance Use Topics   Alcohol use: No   Drug use: Not Currently    Types: Marijuana     Allergies   Patient has no known allergies.   Review of Systems Review of Systems  Constitutional:  Negative for appetite change, chills and fever.  HENT:  Positive for congestion and sore throat. Negative for ear pain, rhinorrhea, sinus pressure and sinus pain.   Eyes:  Negative for redness and visual disturbance.  Respiratory:  Negative for cough, chest tightness, shortness of breath and wheezing.   Cardiovascular:  Negative for chest pain and palpitations.  Gastrointestinal:  Negative for abdominal pain, constipation, diarrhea, nausea and vomiting.  Genitourinary:  Negative for dysuria, frequency and urgency.  Musculoskeletal:  Negative for myalgias.  Neurological:  Negative for dizziness, weakness and headaches.  Psychiatric/Behavioral:  Negative for confusion.   All other systems reviewed and are negative.   Physical Exam Triage Vital Signs ED Triage Vitals  Enc Vitals Group     BP 07/22/20 1230 131/81  Pulse Rate 07/22/20 1230 (!) 102     Resp 07/22/20 1230 18     Temp 07/22/20 1230 (!) 100.7 F (38.2 C)     Temp Source 07/22/20 1230 Oral     SpO2 07/22/20 1230 100 %     Weight --      Height --      Head Circumference --      Peak Flow --      Pain Score 07/22/20 1228 8     Pain Loc --      Pain Edu? --      Excl. in GC? --    No data found.  Updated Vital Signs BP 131/81 (BP Location: Right Arm)   Pulse (!) 102   Temp (!) 100.7 F (38.2 C) (Oral)   Resp 18   SpO2 100%   Visual  Acuity Right Eye Distance:   Left Eye Distance:   Bilateral Distance:    Right Eye Near:   Left Eye Near:    Bilateral Near:     Physical Exam Vitals reviewed.  Constitutional:      General: He is not in acute distress.    Appearance: Normal appearance. He is not ill-appearing.  HENT:     Head: Normocephalic and atraumatic.     Right Ear: Hearing, tympanic membrane, ear canal and external ear normal. No swelling or tenderness. There is no impacted cerumen. No mastoid tenderness. Tympanic membrane is not perforated, erythematous, retracted or bulging.     Left Ear: Hearing, tympanic membrane, ear canal and external ear normal. No swelling or tenderness. There is no impacted cerumen. No mastoid tenderness. Tympanic membrane is not perforated, erythematous, retracted or bulging.     Nose:     Right Sinus: No maxillary sinus tenderness or frontal sinus tenderness.     Left Sinus: No maxillary sinus tenderness or frontal sinus tenderness.     Mouth/Throat:     Mouth: Mucous membranes are moist.     Pharynx: Uvula midline. Posterior oropharyngeal erythema present. No oropharyngeal exudate.     Tonsils: No tonsillar exudate. 1+ on the right. 1+ on the left.     Comments: Smooth erythema posterior pharynx  Cardiovascular:     Rate and Rhythm: Normal rate and regular rhythm.     Heart sounds: Normal heart sounds.  Pulmonary:     Breath sounds: Normal breath sounds and air entry. No wheezing, rhonchi or rales.  Chest:     Chest wall: No tenderness.  Abdominal:     General: Abdomen is flat. Bowel sounds are normal.     Tenderness: There is no abdominal tenderness. There is no guarding or rebound.  Lymphadenopathy:     Cervical: Cervical adenopathy present.     Right cervical: Superficial cervical adenopathy present.     Left cervical: Superficial cervical adenopathy present.  Skin:    Comments: Bilateral axilla with 2 pustules each, no warmth or erythema. No fluctuance. No spontaneous  drainage.   Neurological:     General: No focal deficit present.     Mental Status: He is alert and oriented to person, place, and time.  Psychiatric:        Attention and Perception: Attention and perception normal.        Mood and Affect: Mood and affect normal.        Behavior: Behavior normal. Behavior is cooperative.        Thought Content: Thought content normal.  Judgment: Judgment normal.     UC Treatments / Results  Labs (all labs ordered are listed, but only abnormal results are displayed) Labs Reviewed  CULTURE, GROUP A STREP [facility])  POCT RAPID STREP A, ED / UC    EKG   Radiology No results found.  Procedures Procedures (including critical care time)  Medications Ordered in UC Medications  acetaminophen (TYLENOL) tablet 650 mg (650 mg Oral Given 07/22/20 1243)    Initial Impression / Assessment and Plan / UC Course  I have reviewed the triage vital signs and the nursing notes.  Pertinent labs & imaging results that were available during my care of the patient were reviewed by me and considered in my medical decision making (see chart for details).     This patient is a 25 year old male presenting with folliculitis and suspected strep pharyngitis. He is borderline tachycardic and borderline febrile.  Has not taken antipyretic.  Rapid strep negative, culture sent.  Patient with classic streptococcal sore throat symptoms for 1 week, resolving on their own.  Suspect that he did have strep pharyngitis, but that this has run its natural course.  Will cover for this and folliculitis with doxycycline as below.  Coding this visit a level 4 for acute illness with systemic symptoms (strep pharyngitis with fevers and tachycardia) and prescription drug management.  Final Clinical Impressions(s) / UC Diagnoses   Final diagnoses:  Folliculitis  Screening for streptococcal infection     Discharge Instructions      -Doxycycline twice daily for 10 days.   Make sure to wear sunscreen while spending long periods of time outside on this medication as it can increase your chance of sunburn. -This will also cover for strep throat. -In the future, if you suspect you have strep throat, seek immediate medical attention.  This needs to be treated with antibiotics to prevent complications.     ED Prescriptions   None    PDMP not reviewed this encounter.   Rhys Martini, PA-C 07/22/20 1407

## 2020-07-22 NOTE — ED Notes (Signed)
Pt  Called in lobby, no response

## 2020-07-22 NOTE — ED Triage Notes (Addendum)
Pt in with c/o bilateral axilla rash that he noticed yesterday  Pt states the rash burns and itches   Pt has not used any medication for sx  Pt also c/o headache  States the when he looks up his head hurts

## 2020-07-24 ENCOUNTER — Other Ambulatory Visit: Payer: Self-pay

## 2020-07-24 ENCOUNTER — Ambulatory Visit (INDEPENDENT_AMBULATORY_CARE_PROVIDER_SITE_OTHER): Payer: Medicaid Other | Admitting: Family Medicine

## 2020-07-24 VITALS — BP 114/65 | HR 90

## 2020-07-24 DIAGNOSIS — R229 Localized swelling, mass and lump, unspecified: Secondary | ICD-10-CM | POA: Diagnosis not present

## 2020-07-24 DIAGNOSIS — G44209 Tension-type headache, unspecified, not intractable: Secondary | ICD-10-CM | POA: Diagnosis not present

## 2020-07-24 NOTE — Patient Instructions (Signed)
Ibuprofen for headaches. Try 400 mg first, then you can increase to 600 mg every 4-6 hours. Maximum of 800 mg at once and maximum of 3,200 mg a day. Always take ibuprofen with food.   NSAID-type medications have two very important potential side effects: gastrointestinal irritation including bleeding and kidney injury. Always take these medications with food and to stop if you experience any gastrointestinal upset, including vomiting, abdominal pain or black/bloody stools. NSAIDs include: Aspirin (Brand name Bayer, Bufferin, and Ecotrin, [facility]), ibuprofen (Brand name: Advil, Motrin), Naproxen (Aleve, Anaprox DS, Naprosyn), and Celecoxib (Celebrex).    Continue doxycycline for the armpit sores.

## 2020-07-24 NOTE — Progress Notes (Signed)
    SUBJECTIVE:   CHIEF COMPLAINT / HPI:   Headhache  No hx of headaches. Frontal/temporal headache. Constantly there and worse when he goes tries to go to sleep. Present when waking up, but not as bad. Pain can be up to 8 sometimes. No blurred vision, vomiting, (sometimes nausea), dizziness. Photophobia. Has tried tylenol.  Patient was seen in the ED a few days ago for the headache, sore throat and was strep negative.  He reports that the throat has completely resolved.    Axillary nodules He does continue to have the axillary nodules that he was started on doxycycline for in the emergency department.  (See picture below).  He denies any fevers, chills.  He has not noticed any draining from the nodules.  He does note that they are painful.  This is never happened to him before.  No known history of at hidradenitis suppurativa or family history of at HS. Patient does not shave the area or use any deoderants.   PERTINENT  PMH / PSH: Sickle cell trait, depression (quetiapine)  OBJECTIVE:   BP 114/65   Pulse 90   General: Well-appearing male, no acute distress HEENT: No facial tenderness.  EOMI, PERRLA.  No cranial nerve deficits. Chest: Regular rate and rhythm, no murmurs Lungs: Clear to auscultation bilaterally Neuro: No focal neurologic deficits. Skin: Billteral multiple scattered subcutaneous nodules of varying sizes from half a centimeter to 2 cm.  Able to express a small amount of serosanguineous fluid from nodule in right axilla.  Otherwise no obvious draining.      ASSESSMENT/PLAN:   Subcutaneous nodules Unclear etiology.   Can consider hidradenitis suppurativa. Unlikely lymphadenopathy as nodules very superficial. No hx of this in the past and no hx of recurent boils. No groin involvement. No concerning medications. Overall improving with doxycycline.   Continue to use doxycycline at this time.  Would recommend follow-up if this recurs or if there is no improvement with continue  doxycycline.  Tension headache History c/w tension headache vs. Migraine. No red flag symptoms. Recommend NSAIDs as needed for headaches, hydration, avoidance of triggers. Return if no improvement.   Melene Plan, MD [facility]

## 2020-07-25 DIAGNOSIS — G44209 Tension-type headache, unspecified, not intractable: Secondary | ICD-10-CM | POA: Insufficient documentation

## 2020-07-25 DIAGNOSIS — R229 Localized swelling, mass and lump, unspecified: Secondary | ICD-10-CM | POA: Insufficient documentation

## 2020-07-25 LAB — CULTURE, GROUP A STREP (THRC)

## 2020-07-25 NOTE — Assessment & Plan Note (Addendum)
History c/w tension headache vs. Migraine. No red flag symptoms. Recommend NSAIDs as needed for headaches, hydration, avoidance of triggers. Return if no improvement.

## 2020-07-25 NOTE — Assessment & Plan Note (Addendum)
Unclear etiology.   Can consider hidradenitis suppurativa. Unlikely lymphadenopathy as nodules very superficial. No hx of this in the past and no hx of recurent boils. No groin involvement. No concerning medications. Overall improving with doxycycline.   Continue to use doxycycline at this time.  Would recommend follow-up if this recurs or if there is no improvement with continue doxycycline.

## 2020-07-30 ENCOUNTER — Telehealth: Payer: Self-pay | Admitting: Family Medicine

## 2020-07-30 ENCOUNTER — Encounter (HOSPITAL_COMMUNITY): Payer: Medicaid Other | Admitting: Psychiatry

## 2020-07-30 NOTE — Telephone Encounter (Addendum)
Called to follow up with patient to see if nodules were still present. Left clnic call back number. If still present, would have patient return for follow up appointment and also get mono spot to rule out.   Melene Plan, M.D.  3:12 PM 07/30/2020

## 2020-08-15 ENCOUNTER — Ambulatory Visit (INDEPENDENT_AMBULATORY_CARE_PROVIDER_SITE_OTHER): Payer: Medicaid Other | Admitting: Psychiatry

## 2020-08-15 ENCOUNTER — Other Ambulatory Visit: Payer: Self-pay

## 2020-08-15 ENCOUNTER — Encounter (HOSPITAL_COMMUNITY): Payer: Self-pay | Admitting: Psychiatry

## 2020-08-15 DIAGNOSIS — F1994 Other psychoactive substance use, unspecified with psychoactive substance-induced mood disorder: Secondary | ICD-10-CM

## 2020-08-15 DIAGNOSIS — F3341 Major depressive disorder, recurrent, in partial remission: Secondary | ICD-10-CM

## 2020-08-15 MED ORDER — QUETIAPINE FUMARATE 300 MG PO TABS: 300.0000 mg | ORAL_TABLET | Freq: Every day | ORAL | 2 refills | Status: DC

## 2020-08-15 MED ORDER — QUETIAPINE FUMARATE 200 MG PO TABS: 200.0000 mg | ORAL_TABLET | Freq: Every day | ORAL | 2 refills | Status: DC

## 2020-08-15 NOTE — Progress Notes (Signed)
BH MD/PA/NP OP Progress Note      08/15/2020 10:56 AM Dustin Tate  MRN:  [id number]  Chief Complaint: "I take my medications maybe 2 times a week" Chief Complaint   Medication Management        HPI: 25 year old male seen today for follow up psychiatric evaluation. He has a psychiatric history of marijuana abuse, depression, SI/SA. He is currently managed on Seroquel 500mg  nightly however patient notes that he takes it PRN. Patient states he now only smokes marijuana approximately 1 day per week.   Today he is pleasant, cooperative, engaged in conversation with eye contact.  Patient is more expressive and his affect is not as flat.  His speech is more productive and he is more talkative today.  He informed that since last visit he has been doing well. His mother also endorses patient being more talkative at home. He still feels like he struggles socially and spends very little time in social settings with peers.  Provider encouraged patient to join social groups or consider joining the [employer].  He notes that he may consider he informed writer that his anxiety and depression continues to be minimal. Provider conducted GAD-7 today scoring 6, last visit scored 4. Provider also conducted PHQ-9 today scoring 7, last visit scored 5.  He is sleeping at least 6 hours nightly but describes back and joint pain which he feels is due to sleeping on the floor "because I toss and turn so much." He endorses 8 pound weight loss and reports reduced appetite. Denies visual or auditory hallucinations, SI/HI, paranoia, mania.  No medication changes made today.  Patient agreeable to continuing all medications as prescribed. He will follow up with outpatient counseling for therapy.  No other concerns noted at this time.     Visit Diagnosis:    ICD-10-CM   1. Recurrent major depressive disorder, in partial remission (HCC)  F33.41 QUEtiapine (SEROQUEL) 200 MG tablet    QUEtiapine (SEROQUEL)  300 MG tablet    2. Substance induced mood disorder (HCC)  F19.94 QUEtiapine (SEROQUEL) 200 MG tablet    QUEtiapine (SEROQUEL) 300 MG tablet      Past Psychiatric History: Brief Psychotic disorder, marijuana use. 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 No pertinent surgical history.  Family Psychiatric History: Denies  Family History:  Family History  Problem Relation Age of Onset   Healthy Mother    Healthy Father     Social History:  Social History   Socioeconomic History   Marital status: Single    Spouse name: Not on file   Number of children: Not on file   Years of education: Not on file   Highest education level: Not on file  Occupational History   Occupation: Clinical research associate: [employer]    Comment: Studies Health and safety inspector  Tobacco Use   Smoking status: Some Days    Packs/day: 0.10    Years: 5.00    Pack years: 0.50    Types: Cigarettes    Start date: 2016   Smokeless tobacco: Never  Vaping Use   Vaping Use: Not on file  Substance and Sexual Activity   Alcohol use: No   Drug use: Not Currently    Types: Marijuana   Sexual activity: Not Currently  Partners: Female    Birth control/protection: Condom  Other Topics Concern   Not on file  Social History Scientist, 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   Social Determinants of Health   Financial Resource Strain: Low Risk    Difficulty of Paying Living Expenses: Not hard at all  Food Insecurity: No Food Insecurity   Worried About Programme researcher, broadcasting/film/video in the Last Year: Never true   Barista in the Last Year: Never true  Transportation Needs: No Transportation Needs   Lack of Transportation (Medical): No   Lack of Transportation (Non-Medical): No  Physical Activity: Insufficiently Active   Days of Exercise per Week: 3 days   Minutes of Exercise  per Session: 30 min  Stress: Stress Concern Present   Feeling of Stress : To some extent  Social Connections: Socially Isolated   Frequency of Communication with Friends and Family: Twice a week   Frequency of Social Gatherings with Friends and Family: Once a week   Attends Religious Services: Never   Database administrator or Organizations: No   Attends Engineer, structural: Never   Marital Status: Never married    Allergies: No Known Allergies  Metabolic Disorder Labs: Lab Results  Component Value Date   HGBA1C 6.1 (H) 07/16/2019   MPG 128.37 07/16/2019   Lab Results  Component Value Date   PROLACTIN 8.6 07/16/2019   Lab Results  Component Value Date   CHOL 137 06/04/2020   TRIG 49 06/04/2020   HDL 46 06/04/2020   CHOLHDL 3.0 06/04/2020   VLDL 8 07/16/2019   LDLCALC 80 06/04/2020   LDLCALC 79 07/16/2019   Lab Results  Component Value Date   TSH 1.009 07/10/2019    Therapeutic Level Labs: No results found for: LITHIUM No results found for: VALPROATE No components found for:  CBMZ  Current Medications: Current Outpatient Medications  Medication Sig Dispense Refill   doxycycline (VIBRAMYCIN) 100 MG capsule Take 1 capsule (100 mg total) by mouth 2 (two) times daily. 20 capsule 0   QUEtiapine (SEROQUEL) 200 MG tablet Take 1 tablet (200 mg total) by mouth at bedtime. 30 tablet 2   QUEtiapine (SEROQUEL) 300 MG tablet Take 1 tablet (300 mg total) by mouth at bedtime. 30 tablet 2   No current facility-administered medications for this visit.     Musculoskeletal: Strength & Muscle Tone: within normal limits Gait & Station: normal Patient leans: N/A  Psychiatric Specialty Exam: Review of Systems  Blood pressure 126/81, pulse 90, height 5\' 3"  (1.6 m), weight 101 lb (45.8 kg), SpO2 100 %.Body mass index is 17.89 kg/m.  General Appearance: Well Groomed  Eye Contact:  Good  Speech:  Clear and Coherent and Slow  Volume:  Decreased  Mood:  Euthymic   Affect:  Appropriate and Congruent  Thought Process:  Coherent, Goal Directed and Linear  Orientation:  Full (Time, Place, and Person)  Thought Content: WDL and Logical   Suicidal Thoughts:  No  Homicidal Thoughts:  No  Memory:  Immediate;   Good Recent;   Good Remote;   Good  Judgement:  Good  Insight:  Good  Psychomotor Activity:  Normal  Concentration:  Concentration: Good and Attention Span: Good  Recall:  Good  Fund of Knowledge: Good  Language: Good  Akathisia:  No  Handed:  Right  AIMS (if indicated): not done  Assets:  Communication Skills Desire for Improvement Financial Resources/Insurance Housing Social Support  ADL's:  Intact  Cognition: WNL  Sleep:  Good   Screenings: AUDIT    Flowsheet Row [facility]  Alcohol Use Disorder Identification Test Final Score (AUDIT) 0      GAD-7    Flowsheet Row Clinical Support from 08/15/2020 in [facility] Clinical Support from 06/28/2020 in [facility] Clinical Support from 04/04/2020 in [facility] Clinical Support from 01/31/2020 in [facility]  Total GAD-7 Score 6 4 2 2       PHQ2-9    Flowsheet Row Clinical Support from 08/15/2020 in [facility] Clinical Support from 06/28/2020 in [facility] Office Visit from 06/04/2020 in [facility] Clinical Support from 04/04/2020 in [facility] Counselor from 03/13/2020 in [facility]  PHQ-2 Total Score 3 1 0 0 1  PHQ-9 Total Score 7 5 2 2 9       Flowsheet Row ED from 07/22/2020 in [facility] Clinical Support from 04/04/2020 in [facility]  C-SSRS RISK CATEGORY Error: Question 6 not populated No Risk         Assessment and Plan: Today patient notes that he is doing well on his current medication regimen.  Patient is more talkative today and pleasant.  No medication changes made today.  Patient agreeable to continue medications as prescribed.  1. Recurrent major depressive disorder, in partial remission (HCC) Continue- QUEtiapine (SEROQUEL) 200 MG tablet; Take 1 tablet (200 mg total) by mouth at bedtime.  Dispense: 30 tablet; Refill: 2 Continue- QUEtiapine (SEROQUEL) 300 MG tablet; Take 1 tablet (300 mg total) by mouth at bedtime.  Dispense: 30 tablet; Refill: 2  2. Substance induced mood disorder (HCC) Continue- QUEtiapine (SEROQUEL) 200 MG tablet; Take 1 tablet (200 mg total) by mouth at bedtime.  Dispense: 30 tablet; Refill: 2 Continue- QUEtiapine (SEROQUEL) 300 MG tablet; Take 1 tablet (300 mg total) by mouth at bedtime.  Dispense: 30 tablet; Refill: 2   Follow up in 3 months Follow up with therapy  06/04/2020, NP 08/15/2020, 10:56 AM

## 2020-08-24 ENCOUNTER — Ambulatory Visit (HOSPITAL_COMMUNITY): Payer: Medicaid Other | Admitting: Clinical

## 2020-11-13 ENCOUNTER — Telehealth (HOSPITAL_COMMUNITY): Payer: Medicaid Other | Admitting: Psychiatry

## 2020-12-03 ENCOUNTER — Encounter: Payer: Self-pay | Admitting: Family Medicine

## 2020-12-03 ENCOUNTER — Other Ambulatory Visit: Payer: Self-pay

## 2020-12-03 ENCOUNTER — Ambulatory Visit (INDEPENDENT_AMBULATORY_CARE_PROVIDER_SITE_OTHER): Payer: Medicaid Other | Admitting: Family Medicine

## 2020-12-03 VITALS — BP 121/74 | HR 72 | Wt 107.2 lb

## 2020-12-03 DIAGNOSIS — F1994 Other psychoactive substance use, unspecified with psychoactive substance-induced mood disorder: Secondary | ICD-10-CM

## 2020-12-03 DIAGNOSIS — Z0000 Encounter for general adult medical examination without abnormal findings: Secondary | ICD-10-CM | POA: Insufficient documentation

## 2020-12-03 DIAGNOSIS — F3341 Major depressive disorder, recurrent, in partial remission: Secondary | ICD-10-CM

## 2020-12-03 DIAGNOSIS — Z23 Encounter for immunization: Secondary | ICD-10-CM | POA: Diagnosis present

## 2020-12-03 MED ORDER — QUETIAPINE FUMARATE 200 MG PO TABS: 200.0000 mg | ORAL_TABLET | Freq: Every day | ORAL | 0 refills | Status: DC

## 2020-12-03 MED ORDER — QUETIAPINE FUMARATE 300 MG PO TABS: 300.0000 mg | ORAL_TABLET | Freq: Every day | ORAL | 0 refills | Status: DC

## 2020-12-03 NOTE — Assessment & Plan Note (Signed)
-   Influenza vaccine given today 

## 2020-12-03 NOTE — Progress Notes (Signed)
    SUBJECTIVE:   CHIEF COMPLAINT / HPI: medication f/u  Dustin Tate presents today for follow up regarding medication refill.  Patient states that he takes both 200 & 300 mg tablets of Seroquel at bedtime nightly.  He states that he needs refills.  Reviewed recent behavioral health notes which also states that this is his medication regimen.  Patient denies audio or visual hallucinations.  He reports that his mood is "okay, probably need to eat something".  He denies SI.  PERTINENT  PMH / PSH:  Tobacco Use  Mood Disorder  Hx of SI   OBJECTIVE:   BP 121/74   Pulse 72   Wt 107 lb 3.2 oz (48.6 kg)   SpO2 100%   BMI 18.99 kg/m   Physical Exam Constitutional:      General: He is not in acute distress.    Appearance: Normal appearance. He is not ill-appearing.  Eyes:     Comments: Injected conjunctiva bilaterally   Cardiovascular:     Rate and Rhythm: Normal rate and regular rhythm.     Pulses: Normal pulses.     Heart sounds: Normal heart sounds.  Pulmonary:     Effort: Pulmonary effort is normal.     Breath sounds: Normal breath sounds. No wheezing or rales.  Abdominal:     General: Bowel sounds are normal. There is no distension.     Palpations: Abdomen is soft.     Tenderness: There is no abdominal tenderness.  Skin:    Findings: No erythema or rash.  Neurological:     General: No focal deficit present.     Mental Status: He is alert and oriented to person, place, and time.  Psychiatric:     Comments: Quiet, maintains appropriate eye contact, appears slightly withdrawn, responds to questions appropriately, no signs of restlessness    ASSESSMENT/PLAN:   Substance induced mood disorder (HCC) Refills for seroquel provided per patient request. Appears to have 2 refills from prior [facility] visit. Will send refills Patient to follow up with upcoming therapy appt in Nov  Recommended follow up with [facility] in 3 months   Health care maintenance Influenza vaccine given today       Ronnald Ramp, MD [facility]

## 2020-12-03 NOTE — Patient Instructions (Signed)
I have submitted refills for you medication.   Please plan to follow up in 3 months and continue with your therapy appointments as scheduled.

## 2020-12-03 NOTE — Assessment & Plan Note (Signed)
Refills for seroquel provided per patient request. Appears to have 2 refills from prior [facility] visit. Will send refills Patient to follow up with upcoming therapy appt in Nov  Recommended follow up with [facility] in 3 months

## 2020-12-04 ENCOUNTER — Ambulatory Visit (INDEPENDENT_AMBULATORY_CARE_PROVIDER_SITE_OTHER): Payer: Medicaid Other | Admitting: Clinical

## 2020-12-04 DIAGNOSIS — F3341 Major depressive disorder, recurrent, in partial remission: Secondary | ICD-10-CM | POA: Diagnosis not present

## 2020-12-08 NOTE — Progress Notes (Signed)
   THERAPIST PROGRESS NOTE  Session Time: 20 minutes  Participation Level: Active  Behavioral Response: CasualAlertEuthymic  Type of Therapy: Individual Therapy  Treatment Goals addressed: Coping  Interventions: CBT and Supportive  Summary:  Dustin Tate is a 25 y.o. male who presents for the scheduled session oriented x5, appropriately dressed, and friendly.  Client denied hallucinations and delusions. Client reported today feeling well.  Client reported since he was last seen he is feeling better.  Client reported he has been doing better about getting himself up and going out to do things evening for himself.  Client stated "I am not really depressed".  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  Client reported he is not very social but that is his baseline.  Client reported he has known associates but did not consider himself to have close friends.  Client reported he has kept certain people at a distance for a reason.  Client reported he continues to live with his mother and aunt and is doing well at that.  Client reported he does have the long-term goal of working but has not been applications yet.  Client reported at this time he feels that scheduled therapy is not necessary and is fine with walking in for therapy as needed.  Client reported he is medication compliant.  Client reported he is eating and sleeping well.   Suicidal/Homicidal: Nowithout intent/plan  Therapist Response:  Therapist began the appointment asking the client how he has been doing since last seen. Therapist used CBT to utilize active listening and positive emotional support. Therapist used CBT to ask open-ended questions about the client identified support as she does feel depression have improved. Therapist used CBT to normalize the clients positives. Therapist used CBT to reinforce the clients use of proper sleep hygiene and staying active in positive social interactions for  hobbies ongoing.      Plan: Client agreed to returning to therapy as needed in the future but will discontinue scheduled appointments at this time.  Client will continue psych evaluation and medication management.   Diagnosis: Recurrent major depressive disorder, in partial remission  Neena Rhymes [name], LCSW 12/04/2020

## 2021-01-24 ENCOUNTER — Other Ambulatory Visit: Payer: Self-pay | Admitting: *Deleted

## 2021-01-24 DIAGNOSIS — F1994 Other psychoactive substance use, unspecified with psychoactive substance-induced mood disorder: Secondary | ICD-10-CM

## 2021-01-24 DIAGNOSIS — F3341 Major depressive disorder, recurrent, in partial remission: Secondary | ICD-10-CM

## 2021-01-24 MED ORDER — QUETIAPINE FUMARATE 200 MG PO TABS: 200.0000 mg | ORAL_TABLET | Freq: Every day | ORAL | 0 refills | Status: DC

## 2021-01-29 ENCOUNTER — Encounter (HOSPITAL_COMMUNITY): Payer: Self-pay | Admitting: Psychiatry

## 2021-01-29 ENCOUNTER — Telehealth (INDEPENDENT_AMBULATORY_CARE_PROVIDER_SITE_OTHER): Payer: Medicaid Other | Admitting: Psychiatry

## 2021-01-29 DIAGNOSIS — F3341 Major depressive disorder, recurrent, in partial remission: Secondary | ICD-10-CM

## 2021-01-29 DIAGNOSIS — F411 Generalized anxiety disorder: Secondary | ICD-10-CM | POA: Diagnosis not present

## 2021-01-29 DIAGNOSIS — F1994 Other psychoactive substance use, unspecified with psychoactive substance-induced mood disorder: Secondary | ICD-10-CM | POA: Diagnosis not present

## 2021-01-29 MED ORDER — QUETIAPINE FUMARATE 300 MG PO TABS: 300.0000 mg | ORAL_TABLET | Freq: Every day | ORAL | 3 refills | Status: DC

## 2021-01-29 MED ORDER — HYDROXYZINE HCL 10 MG PO TABS: 10.0000 mg | ORAL_TABLET | Freq: Three times a day (TID) | ORAL | 3 refills | Status: DC | PRN

## 2021-01-29 MED ORDER — QUETIAPINE FUMARATE 200 MG PO TABS: 200.0000 mg | ORAL_TABLET | Freq: Every day | ORAL | 3 refills | Status: DC

## 2021-01-29 NOTE — Progress Notes (Signed)
BH MD/PA/NP OP Progress Note Virtual Visit via Telephone Note  I connected with Dustin Tate on 01/29/21 at 11:00 AM EST by telephone and verified that I am speaking with the correct person using two identifiers.  Location: Patient: home Provider: Clinic   I discussed the limitations, risks, security and privacy concerns of performing an evaluation and management service by telephone and the availability of in person appointments. I also discussed with the patient that there may be a patient responsible charge related to this service. The patient expressed understanding and agreed to proceed.   I provided 30 minutes of non-face-to-face time during this encounter.       01/29/2021 11:43 AM Dustin Tate  MRN:  [id number]  Chief Complaint: "I take my meds sometime" Per mother "He paces back and forth"       HPI: 25 year old male seen today for follow up psychiatric evaluation. He has a psychiatric history of marijuana abuse, depression, SI/SA. He is currently managed on Seroquel 500mg  nightly however patient notes that he takes it PRN and notes that they are somewhat effective in managing his psychiatric conditions.   Today he was unable to login virtually so his assessment was done over the phone. During exam his speech was slow and the volume was decreased. He however was pleasant, cooperative, and engaged in conversation. He notes that he continues to only take his Seroquel periodically. He notes that he may take his medications twice a week. Lately he notes that he has been more anxious and depressed. Patient was seen with his mother who notes that he has been pacing more recently. She reports that she believes he is hearing voices.  Patient denies AVH or paranoia. He does note that he continues to smoke marijuana every other day. Provider informed patient that marijuana can worsen his mental health. He endorsed understanding and agreed.  Today provider conducted GAD-7  today scoring 12, last visit scored 6. Provider also conducted PHQ-9 and patient scored  a 21, last visit scored 7.  Today he endorses adequate sleep and appetite. His mother however reports that he is not sleeping well noting that he says up all day and night. Patient denies SI/HI, paranoia, mania.  Provider recommended and antidepressant to help manage anxiety and depression but patient notes that he was not interested. Provider also recommended a LAI to help with medication compliance but patient reports that he was not interested. He was agreeable to restart hydroxyzine to help manage anxiety. He will continue all other medications as prescribed.   No other concerns noted at this time.     Visit Diagnosis:    ICD-10-CM   1. Recurrent major depressive disorder, in partial remission (HCC)  F33.41 QUEtiapine (SEROQUEL) 200 MG tablet    QUEtiapine (SEROQUEL) 300 MG tablet    2. Substance induced mood disorder (HCC)  F19.94 QUEtiapine (SEROQUEL) 200 MG tablet    QUEtiapine (SEROQUEL) 300 MG tablet      Past Psychiatric History: Brief Psychotic disorder, marijuana use. 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 No pertinent surgical history.  Family Psychiatric History: Denies  Family History:  Family History  Problem Relation Age of Onset   Healthy Mother    Healthy Father     Social History:  Social History   Socioeconomic History   Marital  status: Single    Spouse name: Not on file   Number of children: Not on file   Years of education: Not on file   Highest education level: Not on file  Occupational History   Occupation: Health and safety inspector: [employer]    Comment: Studies Primary school teacher  Tobacco Use   Smoking status: Some Days    Packs/day: 0.10    Years: 5.00    Pack years: 0.50    Types: Cigarettes    Start date: 2016    Smokeless tobacco: Never  Vaping Use   Vaping Use: Not on file  Substance and Sexual Activity   Alcohol use: No   Drug use: Not Currently    Types: Marijuana   Sexual activity: Not Currently    Partners: Female    Birth control/protection: Condom  Other Topics Concern   Not on file  Social History Scientist, 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   Social Determinants of Health   Financial Resource Strain: Not on file  Food Insecurity: Not on file  Transportation Needs: Not on file  Physical Activity: Not on file  Stress: Not on file  Social Connections: Not on file    Allergies: No Known Allergies  Metabolic Disorder Labs: Lab Results  Component Value Date   HGBA1C 6.1 (H) 07/16/2019   MPG 128.37 07/16/2019   Lab Results  Component Value Date   PROLACTIN 8.6 07/16/2019   Lab Results  Component Value Date   CHOL 137 06/04/2020   TRIG 49 06/04/2020   HDL 46 06/04/2020   CHOLHDL 3.0 06/04/2020   VLDL 8 07/16/2019   LDLCALC 80 06/04/2020   LDLCALC 79 07/16/2019   Lab Results  Component Value Date   TSH 1.009 07/10/2019    Therapeutic Level Labs: No results found for: LITHIUM No results found for: VALPROATE No components found for:  CBMZ  Current Medications: Current Outpatient Medications  Medication Sig Dispense Refill   hydrOXYzine (ATARAX) 10 MG tablet Take 1 tablet (10 mg total) by mouth 3 (three) times daily as needed. 90 tablet 3   QUEtiapine (SEROQUEL) 200 MG tablet Take 1 tablet (200 mg total) by mouth at bedtime. 30 tablet 3   QUEtiapine (SEROQUEL) 300 MG tablet Take 1 tablet (300 mg total) by mouth at bedtime. 30 tablet 3   No current facility-administered medications for this visit.     Musculoskeletal: Strength & Muscle Tone:  Unable to assess due to telephone visit Gait & Station:  Unable to assess due to telephone visit Patient leans: N/A  Psychiatric Specialty Exam: Review of Systems  There were no vitals taken for this  visit.There is no height or weight on file to calculate BMI.  General Appearance:  Unable to assess due to telephone visit  Eye Contact:   Unable to assess due to telephone visit  Speech:  Clear and Coherent and Slow  Volume:  Decreased  Mood:  Anxious and Depressed  Affect:  Appropriate and Congruent  Thought Process:  Coherent, Goal Directed and Linear  Orientation:  Full (Time, Place, and Person)  Thought Content: WDL and Logical   Suicidal Thoughts:  No  Homicidal Thoughts:  No  Memory:  Immediate;   Good Recent;   Good Remote;   Good  Judgement:  Good  Insight:  Good  Psychomotor Activity:   Unable to assess due to telephone visit  Concentration:  Concentration: Good and Attention Span: Good  Recall:  Good  Fund  of Knowledge: Good  Language: Good  Akathisia:   Unable to assess due to telephone visit  Handed:  Right  AIMS (if indicated): not done  Assets:  Communication Skills Desire for Improvement Financial Resources/Insurance Housing Social Support  ADL's:  Intact  Cognition: WNL  Sleep:  Fair   Screenings: AUDIT    Flowsheet Row [facility]  Alcohol Use Disorder Identification Test Final Score (AUDIT) 0      GAD-7    Flowsheet Row Video Visit from 01/29/2021 in [facility] Clinical Support from 08/15/2020 in [facility] Clinical Support from 06/28/2020 in [facility] Clinical Support from 04/04/2020 in [facility] Clinical Support from 01/31/2020 in [facility]  Total GAD-7 Score 12 6 4 2 2       PHQ2-9    Flowsheet Row Video Visit from 01/29/2021 in [facility] Office Visit from 12/03/2020 in [facility] Clinical Support from 08/15/2020 in [facility] Clinical Support  from 06/28/2020 in [facility] Office Visit from 06/04/2020 in [facility]  PHQ-2 Total Score 6 0 3 1 0  PHQ-9 Total Score 21 3 7 5 2       Flowsheet Row ED from 07/22/2020 in [facility] Clinical Support from [facility]  C-SSRS RISK CATEGORY Error: Question 6 not populated No Risk        Assessment and Plan: Today patient endorses substance-induced mood disorder, anxiety, and depression. Provider recommended and antidepressant to help manage anxiety and depression but patient notes that he was not interested. Provider also recommended a LAI to help with medication compliance but patient reports that he was not interested. He was agreeable to restart hydroxyzine to help manage anxiety. He will continue all other medications as prescribed  1. Recurrent major depressive disorder, in partial remission (HCC)  Continue- QUEtiapine (SEROQUEL) 200 MG tablet; Take 1 tablet (200 mg total) by mouth at bedtime.  Dispense: 30 tablet; Refill: 3 Continue- QUEtiapine (SEROQUEL) 300 MG tablet; Take 1 tablet (300 mg total) by mouth at bedtime.  Dispense: 30 tablet; Refill: 3  2. Substance induced mood disorder (HCC)  Continue- QUEtiapine (SEROQUEL) 200 MG tablet; Take 1 tablet (200 mg total) by mouth at bedtime.  Dispense: 30 tablet; Refill: 3 Continue- QUEtiapine (SEROQUEL) 300 MG tablet; Take 1 tablet (300 mg total) by mouth at bedtime.  Dispense: 30 tablet; Refill: 3  3. Generalized anxiety disorder  Start- hydrOXYzine (ATARAX) 10 MG tablet; Take 1 tablet (10 mg total) by mouth 3 (three) times daily as needed.  Dispense: 90 tablet; Refill: 3    Follow up in 3 months Follow up with therapy  06/04/2020, NP 01/29/2021, 11:43 AM

## 2021-04-16 ENCOUNTER — Telehealth (HOSPITAL_COMMUNITY): Payer: Medicaid Other | Admitting: Psychiatry

## 2021-04-22 ENCOUNTER — Encounter (HOSPITAL_COMMUNITY): Payer: Medicaid Other | Admitting: Psychiatry

## 2021-05-24 IMAGING — CT CT HEAD W/O CM
4 series · 16 of 47 positions shown, 18 images · non-contrast
Comparison: July 18, 2013

CLINICAL DATA: Altered mental status. Suicidal ideation. Auditory
hallucinations.

EXAM:
CT HEAD WITHOUT CONTRAST
TECHNIQUE: Contiguous axial images were obtained from the base of the skull
through the vertex without intravenous contrast.

[Series 2: head wo · axial · 0.37mm/px · z∈[-132,-26]mm · 7 of 29 slices shown, 9 images]
[im 4/29  brain]
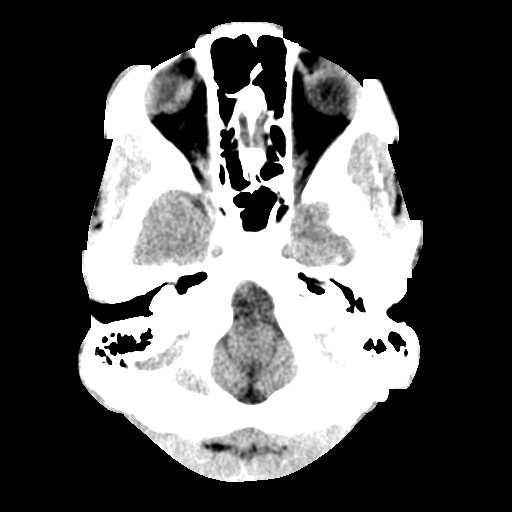
[im 4/29  bone]
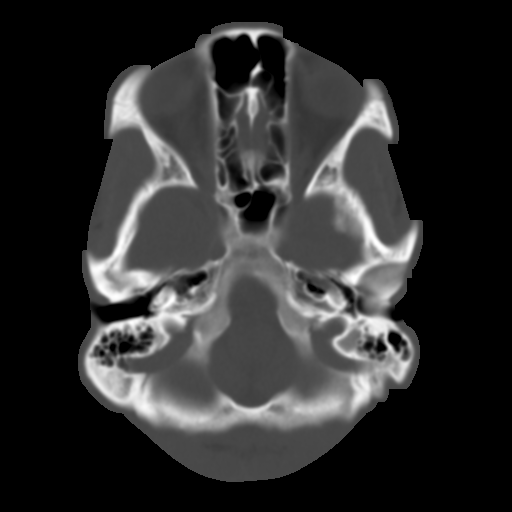
[im 8/29  brain]
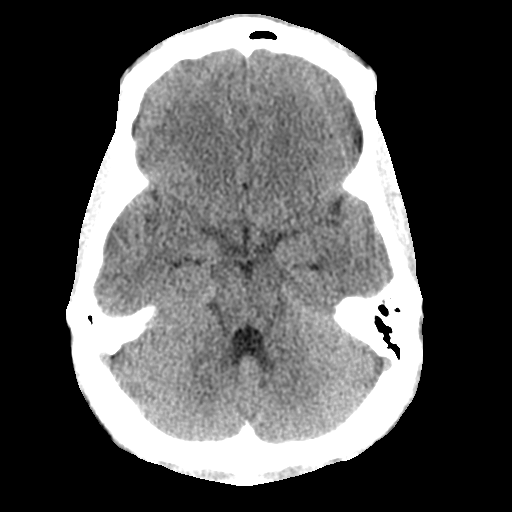
[im 11/29  brain]
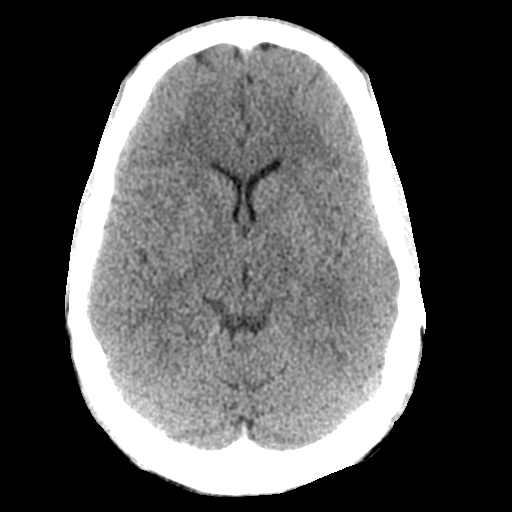
[im 15/29  brain]
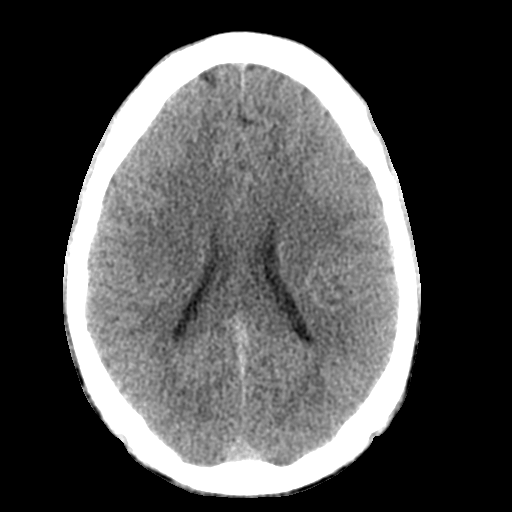
[im 18/29  brain]
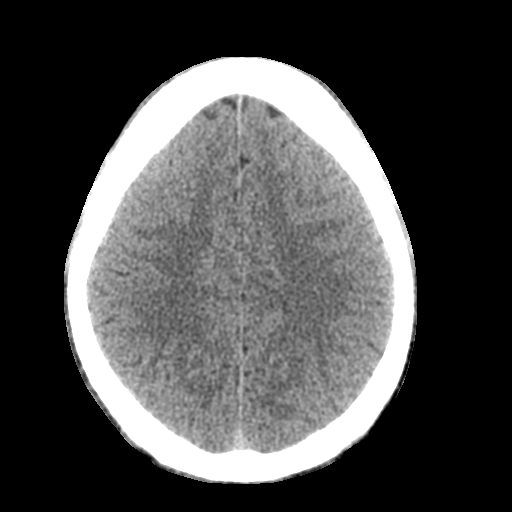
[im 18/29  bone]
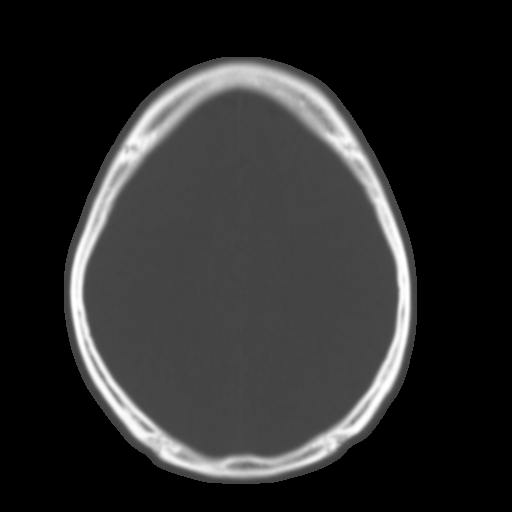
[im 22/29  brain]
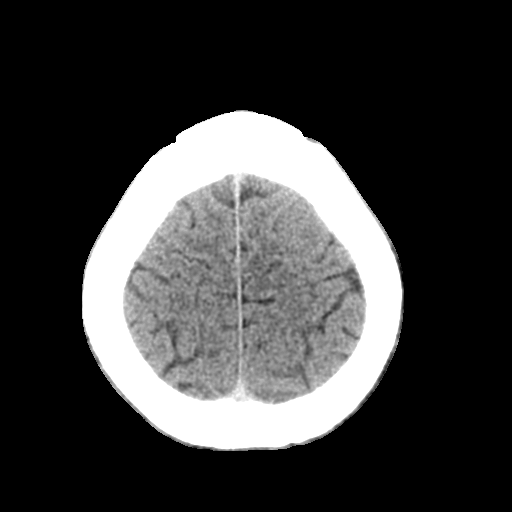
[im 25/29  brain]
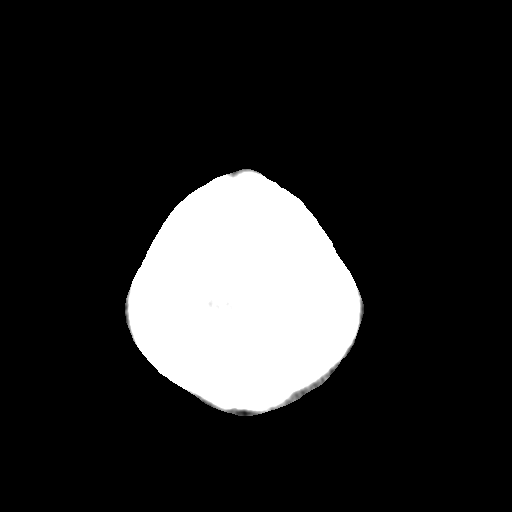

[Series 3: head bone · axial · 0.37mm/px · z∈[-132,-104]mm · 3 of 73 slices shown]
[im 8/73  bone]
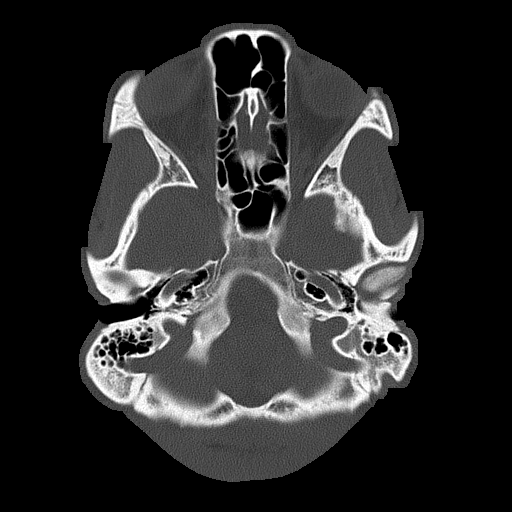
[im 15/73  bone]
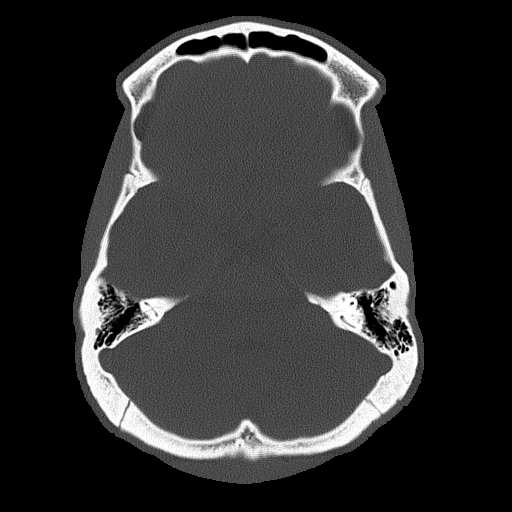
[im 22/73  bone]
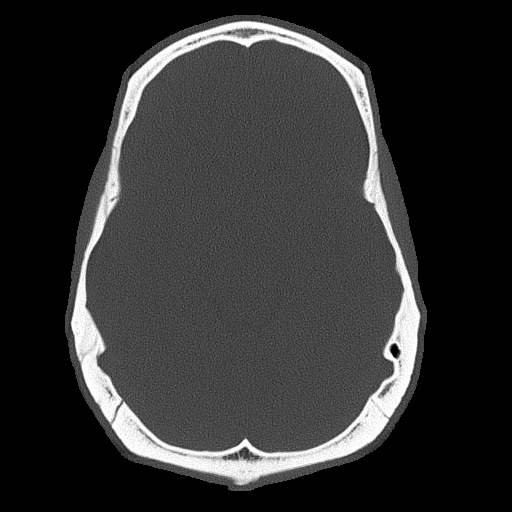

[Series 5: coronal soft tissue · coronal · 0.28mm/px · 3 of 64 slices shown]
[im 22/64  brain]
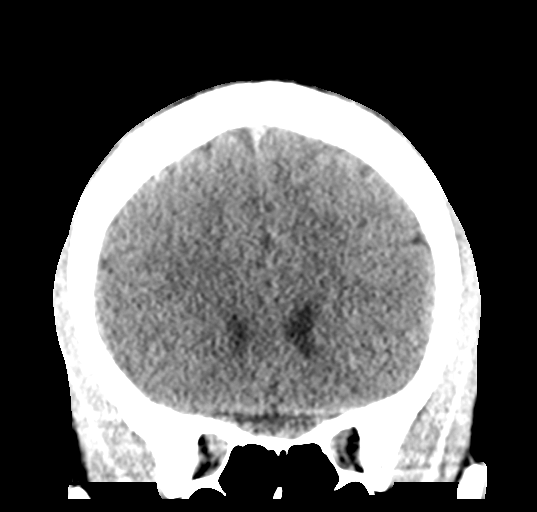
[im 29/64  brain]
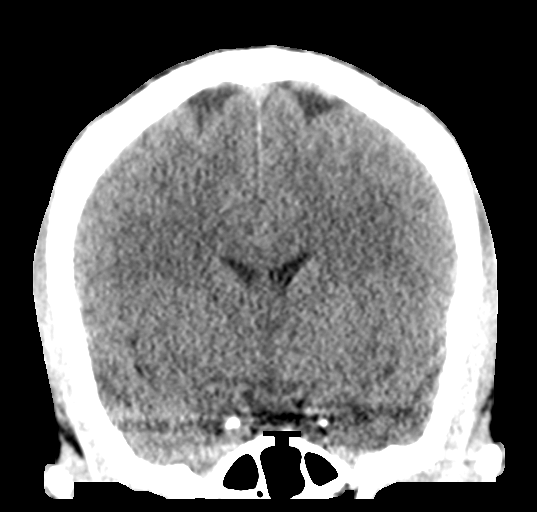
[im 36/64  brain]
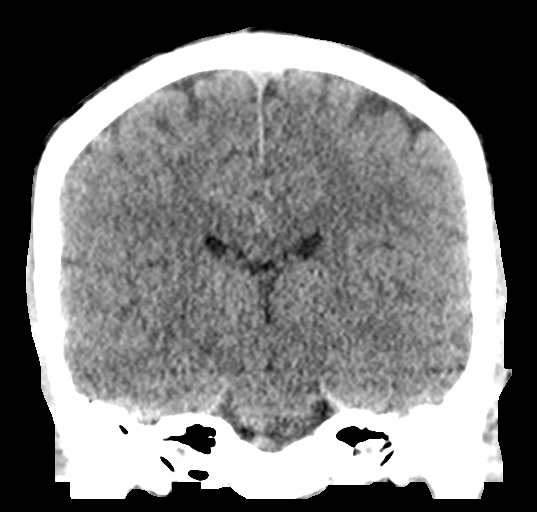

[Series 6: sagittal soft tissue · sagittal · 0.28mm/px · 3 of 51 slices shown]
[im 17/51  brain]
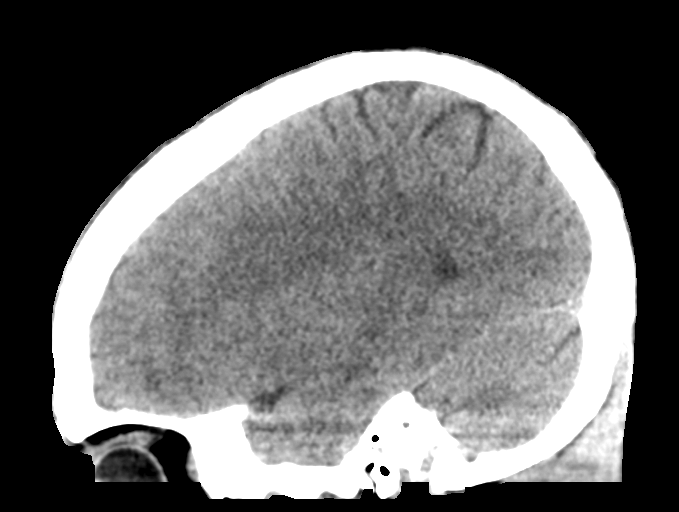
[im 26/51  brain]
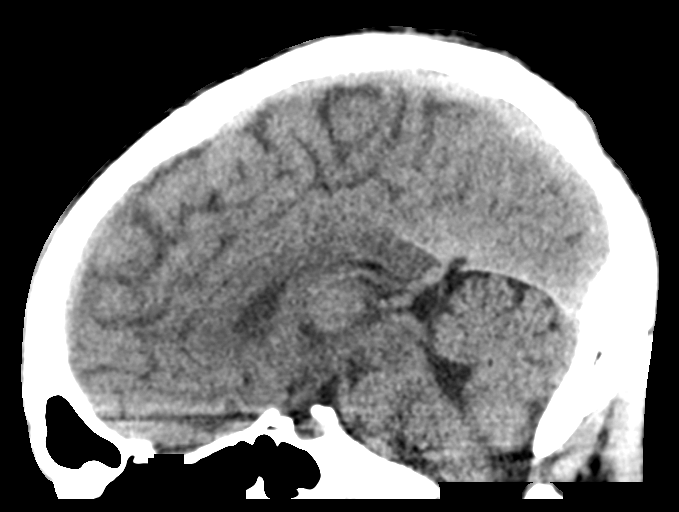
[im 34/51  brain]
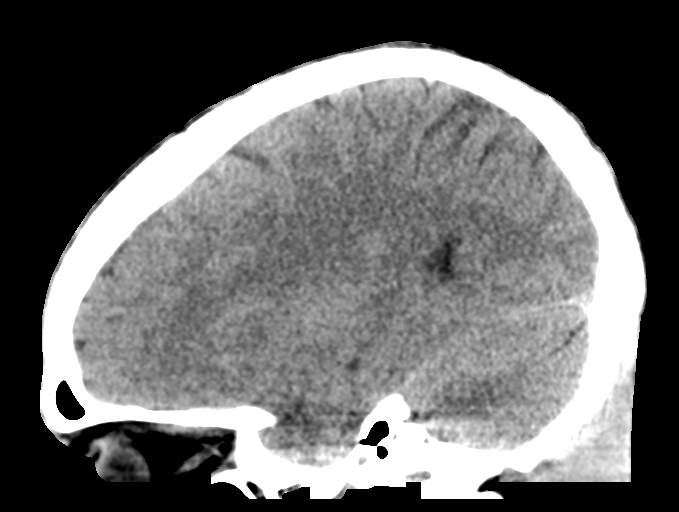

[16 of 47 positions shown; findings below may reference images not displayed]

FINDINGS: Brain: Ventricles and sulci are normal in size and configuration.
There is no intracranial mass, hemorrhage, extra-axial fluid
collection, or midline shift. The brain parenchyma appears
unremarkable. There is no evident acute infarct.

Vascular: No hyperdense vessel.  No evident vascular calcification.

Skull: The bony calvarium appears intact.

Sinuses/Orbits: Visualized paranasal sinuses are clear. Visualized
orbits appear symmetric bilaterally.

Other: Mastoid air cells are clear.
IMPRESSION: Study within normal limits.

## 2021-06-07 ENCOUNTER — Telehealth (HOSPITAL_COMMUNITY): Payer: Self-pay | Admitting: *Deleted

## 2021-06-07 NOTE — Telephone Encounter (Signed)
Submitted on cover my meds for patients quetiapine 200 mg, waiting on the determination. ?

## 2021-06-10 ENCOUNTER — Telehealth (HOSPITAL_COMMUNITY): Payer: Self-pay | Admitting: *Deleted

## 2021-06-10 NOTE — Telephone Encounter (Signed)
QUETIAPINE FUMARATE 200 MG TAB: quantity/billable units of 30 approved for 06/07/21-06/07/22. ?

## 2021-06-12 ENCOUNTER — Telehealth (HOSPITAL_COMMUNITY): Payer: Self-pay | Admitting: *Deleted

## 2021-06-12 NOTE — Telephone Encounter (Signed)
Receieved an additional PA for this patients Quetiapine which has has already been approved. Faxed his pharmacy on [address] this has already been approved. ?

## 2021-06-13 ENCOUNTER — Telehealth (HOSPITAL_COMMUNITY): Payer: Self-pay | Admitting: *Deleted

## 2021-06-13 NOTE — Telephone Encounter (Signed)
Start PA --QUEtiapie fumarate 300 mg, awaiting for approval. ?

## 2021-06-18 ENCOUNTER — Telehealth (HOSPITAL_COMMUNITY): Payer: Self-pay | Admitting: *Deleted

## 2021-06-18 NOTE — Telephone Encounter (Signed)
06/18/21 RESPONSE:BLUE CROSS/BLUE SHIELD OF [place] HEALTHY BLUE ? Requested Medication Does Not Require Prior Authorization QUEtiapine (SEROQUEL) 300 MG tablet ?Take 1 tablet (300 mg total) by mouth at bedtime  ? ?

## 2021-06-18 NOTE — Telephone Encounter (Signed)
06/17/21 RESPONSE:BLUE CROSS/BLUE SHIELD OF [place] HEALTHY BLUE ? FAXED ACCORDING TO THEIR RECORDS QUEtiapine (SEROQUEL) 300 MG tablet ?Take 1 tablet (300 mg total) by mouth at bedtime  ?  ?DRUG STRENGTH & QUANITY REQUESTED IS A DUPLICATE OF CASE THAT WAS PREVIOUSKY REVIEWED ?

## 2021-06-19 ENCOUNTER — Telehealth (HOSPITAL_COMMUNITY): Payer: Medicaid Other | Admitting: Psychiatry

## 2021-06-19 ENCOUNTER — Encounter (HOSPITAL_COMMUNITY): Payer: Self-pay

## 2021-06-20 ENCOUNTER — Telehealth (HOSPITAL_COMMUNITY): Payer: Medicaid Other | Admitting: Psychiatry

## 2021-06-20 ENCOUNTER — Encounter (HOSPITAL_COMMUNITY): Payer: Self-pay

## 2021-09-05 ENCOUNTER — Ambulatory Visit (INDEPENDENT_AMBULATORY_CARE_PROVIDER_SITE_OTHER): Payer: Medicaid Other | Admitting: Student

## 2021-09-05 ENCOUNTER — Encounter: Payer: Self-pay | Admitting: Student

## 2021-09-05 VITALS — BP 102/71 | HR 71 | Temp 97.9°F | Ht 64.0 in | Wt 98.4 lb

## 2021-09-05 DIAGNOSIS — Z23 Encounter for immunization: Secondary | ICD-10-CM

## 2021-09-05 DIAGNOSIS — F17200 Nicotine dependence, unspecified, uncomplicated: Secondary | ICD-10-CM

## 2021-09-05 DIAGNOSIS — Z0000 Encounter for general adult medical examination without abnormal findings: Secondary | ICD-10-CM

## 2021-09-05 DIAGNOSIS — F339 Major depressive disorder, recurrent, unspecified: Secondary | ICD-10-CM

## 2021-09-05 DIAGNOSIS — F1994 Other psychoactive substance use, unspecified with psychoactive substance-induced mood disorder: Secondary | ICD-10-CM | POA: Diagnosis not present

## 2021-09-05 DIAGNOSIS — F17210 Nicotine dependence, cigarettes, uncomplicated: Secondary | ICD-10-CM

## 2021-09-05 MED ORDER — NICOTINE POLACRILEX 2 MG MT GUM: 2.0000 mg | CHEWING_GUM | OROMUCOSAL | 0 refills | Status: DC | PRN

## 2021-09-05 NOTE — Progress Notes (Signed)
SUBJECTIVE:   Chief compliant/HPI: annual examination  Dustin Tate is a 26 y.o. who presents today for an annual exam.   Reviewed and updated history.   Still taking Seroquel and Hydroxyzine for anxiety and mood disorder.  Sees therapy intermittently   Smokes 2 cigarettes daily, considering quitting. He would like to try gum to stop.  Plays video games for hobbies   Review of Systems  HENT:  Negative for congestion.   Respiratory:  Negative for cough and shortness of breath.   Cardiovascular:  Negative for chest pain and leg swelling.  Gastrointestinal:  Negative for constipation and diarrhea.  Genitourinary:  Negative for urgency.  Skin:  Negative for rash.   OBJECTIVE:   BP 102/71   Pulse 71   Temp 97.9 F (36.6 C) (Oral)   Ht 5\' 4"  (1.626 m)   Wt 98 lb 6 oz (44.6 kg)   SpO2 100%   BMI 16.89 kg/m   General: Alert and oriented in no apparent distress; pleasant AAM  Heart: Regular rate and rhythm with no murmurs appreciated Lungs: CTA bilaterally, no wheezing Abdomen: Bowel sounds present, no abdominal pain Skin: Warm and dry Extremities: No lower extremity edema   ASSESSMENT/PLAN:   Substance induced mood disorder (HCC) Patient reports that he has an upcoming therapy appointment, unable to see this in his chart Offered options for therapist through Medicaid, patient declined Continue Seroquel as he has been stable on this for quite some time Follow-up in 6 months  Cigarette nicotine dependence without complication Tobacco use: Smoked 1-2 cigarettes a day. Does not qualify for lung cancer screen at this time. Patient was counseled on the risks of tobacco use and cessation strongly encouraged. Ordered gum to pharmacy   Discussed various methods of smoking cessation: Use a timer between smokes, occupying hands as that may be a trigger, making it difficult for self to access pack of cigarettes.  Advised use of Nicorette gum and follow-up with our  pharmacist, Dr. , for additional pharmacological interventions. Recommendations: Annual lung cancer screening with low-dose CT to adults aged 83 to 80 years, with a 20 pack-year smoking history, and who currently smoke or have quit within the last 15 years (B recommendation). Screening should be discontinued once a person has not smoked for 15 years, develops a substantially limited life-expectancy, or no longer desires to continue screening.   Depression, recurrent (HCC) Continue with therapy and medications   Health care maintenance UTD, received HPV today     Annual Examination  See AVS for age appropriate recommendations  PHQ score reviewed and discussed.     09/05/2021    1:41 PM 01/29/2021   11:27 AM 12/03/2020   11:00 AM  PHQ9 SCORE ONLY  PHQ-9 Total Score 14 21 3   Blood pressure reviewed and at goal.    Advanced directive Non-indicated   Considered the following items based upon USPSTF recommendations: HIV testing:  NR at last check 10/13/19 Hepatitis C:  <0.1 HCV ab 10/13/19 Syphilis if at high risk: not indicated GC/CT: not indicated//patient denies exposure to STIs  Lipid panel (nonfasting or fasting) discussed based upon AHA recommendations and not ordered.  Consider repeat every 4-6 years.  Reviewed risk factors for latent tuberculosis and not indicated Immunizations HPV    MDD  Substance Induced Mood Disorder: Seroquel 200 mg QHS  GAD: Hydroxyzine 10 mg   Continue with therapy   Follow up in 1 year or sooner if indicated.    Isaly Fasching 12/13/19,  MD [facility]

## 2021-09-05 NOTE — Assessment & Plan Note (Signed)
Continue with therapy and medications

## 2021-09-05 NOTE — Assessment & Plan Note (Signed)
UTD, received HPV today

## 2021-09-05 NOTE — Patient Instructions (Addendum)
It was great to see you today! Thank you for choosing [facility] for your primary care. Dustin Tate was seen for follow up.  Please continue with current medications  Call for refills  Continue with nicotine gum to curb cravings    If you haven't already, sign up for My Chart to have easy access to your labs results, and communication with your primary care physician.  One of your biggest health concerns is your smoking.  This increases your risk for most cancers and serious cardiovascular diseases such as strokes, heart attacks.  You should try your best to stop.  If you need assistance, please contact your PCP or Smoking Cessation Class at [facility] [phone]) or [facility] Quit-Line (1-800-QUIT-NOW).  You should return to our clinic Return in about 6 months (around 03/08/2022) for medication.  I recommend that you always bring your medications to each appointment as this makes it easy to ensure you are on the correct medications and helps [name] not miss refills when you need them.  Please arrive 15 minutes before your appointment to ensure smooth check in process.  We appreciate your efforts in making this happen.  Please call the clinic at [phone] if your symptoms worsen or you have any concerns.  Thank you for allowing me to participate in your care, Alfredo Martinez, MD 09/05/2021, 2:34 PM PGY-2, [facility]

## 2021-09-05 NOTE — Assessment & Plan Note (Signed)
Tobacco use: Smoked 1-2 cigarettes a day. Does not qualify for lung cancer screen at this time. Patient was counseled on the risks of tobacco use and cessation strongly encouraged. Ordered gum to pharmacy   Discussed various methods of smoking cessation: Use a timer between smokes, occupying hands as that may be a trigger, making it difficult for self to access pack of cigarettes.  Advised use of Nicorette gum and follow-up with our pharmacist, Dr. Raymondo Band, for additional pharmacological interventions. Recommendations: Annual lung cancer screening with low-dose CT to adults aged 74 to 80 years, with a 20 pack-year smoking history, and who currently smoke or have quit within the last 15 years (B recommendation). Screening should be discontinued once a person has not smoked for 15 years, develops a substantially limited life-expectancy, or no longer desires to continue screening.

## 2021-09-05 NOTE — Assessment & Plan Note (Signed)
Patient reports that he has an upcoming therapy appointment, unable to see this in his chart Offered options for therapist through Medicaid, patient declined Continue Seroquel as he has been stable on this for quite some time Follow-up in 6 months

## 2021-09-20 ENCOUNTER — Telehealth (INDEPENDENT_AMBULATORY_CARE_PROVIDER_SITE_OTHER): Payer: Medicaid Other | Admitting: Psychiatry

## 2021-09-20 ENCOUNTER — Encounter (HOSPITAL_COMMUNITY): Payer: Self-pay | Admitting: Psychiatry

## 2021-09-20 DIAGNOSIS — F1994 Other psychoactive substance use, unspecified with psychoactive substance-induced mood disorder: Secondary | ICD-10-CM

## 2021-09-20 DIAGNOSIS — F3341 Major depressive disorder, recurrent, in partial remission: Secondary | ICD-10-CM

## 2021-09-20 MED ORDER — QUETIAPINE FUMARATE 300 MG PO TABS: 300.0000 mg | ORAL_TABLET | Freq: Every day | ORAL | 3 refills | Status: DC

## 2021-09-20 MED ORDER — QUETIAPINE FUMARATE 200 MG PO TABS: 200.0000 mg | ORAL_TABLET | Freq: Every day | ORAL | 3 refills | Status: DC

## 2021-09-20 NOTE — Progress Notes (Signed)
BH MD/PA/NP OP Progress Note Virtual Visit via Telephone Note  I connected with Dustin Tate on 09/20/21 at 10:30 AM EDT by telephone and verified that I am speaking with the correct person using two identifiers.  Location: Patient: home Provider: Clinic   I discussed the limitations, risks, security and privacy concerns of performing an evaluation and management service by telephone and the availability of in person appointments. I also discussed with the patient that there may be a patient responsible charge related to this service. The patient expressed understanding and agreed to proceed.   I provided 30 minutes of non-face-to-face time during this encounter.       09/20/2021 1:14 PM Dustin Tate  MRN:  [id number]  Chief Complaint: "Not much has changed"        HPI: 26 year old male seen today for follow up psychiatric evaluation. He has a psychiatric history of marijuana abuse, depression, SI/SA. He is currently managed on Seroquel 500mg  nightly and notes that they are effective in managing his psychiatric conditions.   Today he was unable to login virtually so his assessment was done over the phone.  Today he was pleasant, cooperative, and engaged in conversation.  He informed that he has been doing well since his last visit.  He reports that not much is changed.  He informed Clinical research associate that his mood is stable and reports that he has minimal anxiety and depression.  A PHQ-9 was conducted on 09/05/2021 and patient scored a 14.  He endorses adequate sleep and appetite.  Today he denies SI/HI/VH, mania, paranoia.    No medication changes made today.  Patient agreeable to continue medication as prescribed.  No other concerns noted at this time.     Visit Diagnosis:    ICD-10-CM   1. Recurrent major depressive disorder, in partial remission (HCC)  F33.41 QUEtiapine (SEROQUEL) 200 MG tablet    QUEtiapine (SEROQUEL) 300 MG tablet    2. Substance induced mood disorder  (HCC)  F19.94 QUEtiapine (SEROQUEL) 200 MG tablet    QUEtiapine (SEROQUEL) 300 MG tablet      Past Psychiatric History: Brief Psychotic disorder, marijuana use. 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  Family Psychiatric History: Denies  Family History:  Family History  Problem Relation Age of Onset   Healthy Mother    Healthy Father     Social History:  Social History   Socioeconomic History   Marital status: Single    Spouse name: Not on file   Number of children: Not on file   Years of education: Not on file   Highest education level: Not on file  Occupational History   Occupation: 11/05/2021: [employer]    Comment: Studies Health and safety inspector  Tobacco Use   Smoking status: Some Days    Packs/day: 0.10    Years: 5.00    Total pack years: 0.50    Types: Cigarettes    Start date: 2016   Smokeless tobacco: Never  Vaping Use   Vaping Use: Not on file  Substance and Sexual Activity   Alcohol use: No   Drug use: Not Currently    Types: Marijuana   Sexual activity: Not Currently    Partners: Female    Birth control/protection: Condom  Other Topics Concern  Not on file  Social History Narrative   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   Social Determinants of Health   Financial Resource Strain: Low Risk  (01/16/2020)   Overall Financial Resource Strain (CARDIA)    Difficulty of Paying Living Expenses: Not hard at all  Food Insecurity: No Food Insecurity (01/16/2020)   Hunger Vital Sign    Worried About Running Out of Food in the Last Year: Never true    Ran Out of Food in the Last Year: Never true  Transportation Needs: No Transportation Needs (01/16/2020)   PRAPARE - Hydrologist (Medical): No    Lack of Transportation (Non-Medical): No  Physical Activity:  Insufficiently Active (01/16/2020)   Exercise Vital Sign    Days of Exercise per Week: 3 days    Minutes of Exercise per Session: 30 min  Stress: Stress Concern Present (01/16/2020)   [place]    Feeling of Stress : To some extent  Social Connections: Socially Isolated (01/16/2020)   Social Connection and Isolation Panel [NHANES]    Frequency of Communication with Friends and Family: Twice a week    Frequency of Social Gatherings with Friends and Family: Once a week    Attends Religious Services: Never    Marine scientist or Organizations: No    Attends Music therapist: Never    Marital Status: Never married    Allergies: No Known Allergies  Metabolic Disorder Labs: Lab Results  Component Value Date   HGBA1C 6.1 (H) 07/16/2019   MPG 128.37 07/16/2019   Lab Results  Component Value Date   PROLACTIN 8.6 07/16/2019   Lab Results  Component Value Date   CHOL 137 06/04/2020   TRIG 49 06/04/2020   HDL 46 06/04/2020   CHOLHDL 3.0 06/04/2020   VLDL 8 07/16/2019   [place] 80 06/04/2020   [place] 79 07/16/2019   Lab Results  Component Value Date   TSH 1.009 07/10/2019    Therapeutic Level Labs: No results found for: "LITHIUM" No results found for: "VALPROATE" No results found for: "CBMZ"  Current Medications: Current Outpatient Medications  Medication Sig Dispense Refill   hydrOXYzine (ATARAX) 10 MG tablet Take 1 tablet (10 mg total) by mouth 3 (three) times daily as needed. 90 tablet 3   nicotine polacrilex (NICORETTE) 2 MG gum Take 1 each (2 mg total) by mouth as needed for smoking cessation. 100 tablet 0   QUEtiapine (SEROQUEL) 200 MG tablet Take 1 tablet (200 mg total) by mouth at bedtime. 30 tablet 3   QUEtiapine (SEROQUEL) 300 MG tablet Take 1 tablet (300 mg total) by mouth at bedtime. 30 tablet 3   No current facility-administered medications for this visit.      Musculoskeletal: Strength & Muscle Tone:  Unable to assess due to telephone visit Gait & Station:  Unable to assess due to telephone visit Patient leans: N/A  Psychiatric Specialty Exam: Review of Systems  There were no vitals taken for this visit.There is no height or weight on file to calculate BMI.  General Appearance:  Unable to assess due to telephone visit  Eye Contact:   Unable to assess due to telephone visit  Speech:  Clear and Coherent and Slow  Volume:  Decreased  Mood:  Euthymic  Affect:  Appropriate and Congruent  Thought Process:  Coherent, Goal Directed and Linear  Orientation:  Full (Time, Place, and Person)  Thought Content: WDL  and Logical   Suicidal Thoughts:  No  Homicidal Thoughts:  No  Memory:  Immediate;   Good Recent;   Good Remote;   Good  Judgement:  Good  Insight:  Good  Psychomotor Activity:   Unable to assess due to telephone visit  Concentration:  Concentration: Good and Attention Span: Good  Recall:  Good  Fund of Knowledge: Good  Language: Good  Akathisia:   Unable to assess due to telephone visit  Handed:  Right  AIMS (if indicated): not done  Assets:  Communication Skills Desire for Improvement Financial Resources/Insurance Housing Social Support  ADL's:  Intact  Cognition: WNL  Sleep:  Fair   Screenings: AUDIT    Flowsheet Row [facility]  Alcohol Use Disorder Identification Test Final Score (AUDIT) 0      GAD-7    Flowsheet Row Video Visit from 01/29/2021 in [facility] Clinical Support from 08/15/2020 in [facility] Clinical Support from 06/28/2020 in [facility] Clinical Support from 04/04/2020 in [facility] Clinical Support from 01/31/2020 in [facility]  Total GAD-7 Score 12 6 4 2 2       PHQ2-9     Flowsheet Row Office Visit from 09/05/2021 in [facility] Video Visit from 01/29/2021 in [facility] Office Visit from 12/03/2020 in [facility] Clinical Support from 08/15/2020 in [facility] Clinical Support from 06/28/2020 in [facility]  PHQ-2 Total Score 4 6 0 3 1  PHQ-9 Total Score 14 21 3 7 5       Flowsheet Row ED from 07/22/2020 in [facility] Clinical Support from 04/04/2020 in [facility]  C-SSRS RISK CATEGORY Error: Question 6 not populated No Risk        Assessment and Plan: Patient reports that he is doing well on her current medication regimen.  No medication changes made today.  Patient agreeable to continue medication as prescribed.  1. Recurrent major depressive disorder, in partial remission (HCC)  Continue- QUEtiapine (SEROQUEL) 200 MG tablet; Take 1 tablet (200 mg total) by mouth at bedtime.  Dispense: 30 tablet; Refill: 3 Continue- QUEtiapine (SEROQUEL) 300 MG tablet; Take 1 tablet (300 mg total) by mouth at bedtime.  Dispense: 30 tablet; Refill: 3  2. Substance induced mood disorder (HCC)  Continue- QUEtiapine (SEROQUEL) 200 MG tablet; Take 1 tablet (200 mg total) by mouth at bedtime.  Dispense: 30 tablet; Refill: 3 Continue- QUEtiapine (SEROQUEL) 300 MG tablet; Take 1 tablet (300 mg total) by mouth at bedtime.  Dispense: 30 tablet; Refill: 3  3. Generalized anxiety disorder  Start- hydrOXYzine (ATARAX) 10 MG tablet; Take 1 tablet (10 mg total) by mouth 3 (three) times daily as needed.  Dispense: 90 tablet; Refill: 3    Follow up in 3 months Follow up with therapy  06/04/2020, NP 09/20/2021, 1:14 PM

## 2021-11-05 ENCOUNTER — Ambulatory Visit (INDEPENDENT_AMBULATORY_CARE_PROVIDER_SITE_OTHER): Payer: Medicaid Other | Admitting: Clinical

## 2021-11-05 DIAGNOSIS — F3341 Major depressive disorder, recurrent, in partial remission: Secondary | ICD-10-CM | POA: Diagnosis not present

## 2021-11-05 NOTE — Progress Notes (Signed)
   THERAPIST PROGRESS NOTE  Session Time: 20 minutes  Participation Level: Active  Behavioral Response: CasualAlertEuthymic  Type of Therapy: Individual Therapy  Treatment Goals addressed: Client will complete at least 80% of assigned homework  ProgressTowards Goals: Progressing  Interventions: CBT and Supportive  Summary:  Dustin Tate is a 26 y.o. male who presents with a scheduled appointment oriented x5, appropriately dressed, and friendly.  Client denied hallucinations and delusions. Client reported on today he has been doing fairly well and has recently moved into a new apartment on his own.  Client reported his primary stressor is needing to find work.  Client reported he thinks he is ready to go find a job.  Client reported sometimes he finds that he is currently just because without any particular reason identified.  Client reported he is compliant with his medications and it has been helpful for management of his mood.  Client reported otherwise he has no other primary concerns that he can think of to discuss and work on therapy. Evidence of progress towards goal: Client reported medication compliance 7 days/week.  Client reported 1 positive goal of wanting to find a job.  Suicidal/Homicidal: Nowithout intent/plan  Therapist Response:  Therapist began the appointment asking the client how he has been doing since last seen. Therapist used CBT to engage using active listening and positive emotional support. Therapist used CBT to ask client open-ended questions about medication compliance committed ongoing symptoms. Therapist used CBT to ask the client about his goals for therapy or stressors that need to be addressed to help improve his quality of life. Therapist used CBT ask the client to identify her progress with frequency of use with coping skills with continued practice in her daily activity.    Therapist gave client information on how to apply for [employer] and encouraged  him to job search other positions when indeed.   Plan: Return again in 4 weeks.  Diagnosis: Recurrent major depressive disorder, in partial remission  Collaboration of Care: Patient refused AEB no other needs requested by the client at this time.  Patient/Guardian was advised Release of Information must be obtained prior to any record release in order to collaborate their care with an outside provider. Patient/Guardian was advised if they have not already done so to contact the registration department to sign all necessary forms in order for [name] to release information regarding their care.   Consent: Patient/Guardian gives verbal consent for treatment and assignment of benefits for services provided during this visit. Patient/Guardian expressed understanding and agreed to proceed.   [place], LCSW 11/05/2021

## 2021-11-09 NOTE — Plan of Care (Signed)
  Problem: Depression CCP Problem  1  Goal: LTG: Jaxan WILL SCORE LESS THAN 10 ON THE PATIENT HEALTH QUESTIONNAIRE (PHQ-9) Outcome: Progressing Goal: STG: Zymier WILL COMPLETE AT LEAST 80% OF ASSIGNED HOMEWORK Outcome: Progressing

## 2021-11-25 ENCOUNTER — Ambulatory Visit (HOSPITAL_COMMUNITY): Payer: Medicaid Other | Admitting: Clinical

## 2021-12-09 ENCOUNTER — Ambulatory Visit (HOSPITAL_COMMUNITY): Payer: Medicaid Other | Admitting: Clinical

## 2021-12-18 ENCOUNTER — Telehealth (HOSPITAL_COMMUNITY): Payer: Medicaid Other | Admitting: Student in an Organized Health Care Education/Training Program

## 2021-12-19 ENCOUNTER — Telehealth (HOSPITAL_COMMUNITY): Payer: Medicaid Other | Admitting: Psychiatry

## 2021-12-19 ENCOUNTER — Encounter (HOSPITAL_COMMUNITY): Payer: Medicaid Other | Admitting: Student in an Organized Health Care Education/Training Program

## 2022-01-03 ENCOUNTER — Ambulatory Visit: Payer: Medicaid Other | Admitting: Family Medicine

## 2022-01-03 ENCOUNTER — Encounter: Payer: Self-pay | Admitting: Family Medicine

## 2022-01-03 VITALS — BP 110/67 | HR 88 | Ht 64.0 in | Wt 106.8 lb

## 2022-01-03 DIAGNOSIS — Z0000 Encounter for general adult medical examination without abnormal findings: Secondary | ICD-10-CM

## 2022-01-03 DIAGNOSIS — F17210 Nicotine dependence, cigarettes, uncomplicated: Secondary | ICD-10-CM

## 2022-01-03 DIAGNOSIS — Z23 Encounter for immunization: Secondary | ICD-10-CM | POA: Diagnosis not present

## 2022-01-03 NOTE — Assessment & Plan Note (Signed)
Received flu and HPV #2 vaccine today.

## 2022-01-03 NOTE — Patient Instructions (Signed)
It was wonderful to see you today.  Please bring ALL of your medications with you to every visit.   Today we talked about:  You received your flu vaccine and second HPV vaccine today. We also advise you stop smoking and can assist you with this process if desired.  Please follow up as needed for other concerns  Thank you for choosing [facility].   Please call [phone] with any questions about today's appointment.  Please be sure to schedule follow up at the front desk before you leave today.   Elberta Fortis, DO Family Medicine

## 2022-01-03 NOTE — Assessment & Plan Note (Signed)
-  Advised smoking cessation

## 2022-01-03 NOTE — Progress Notes (Signed)
    SUBJECTIVE:   CHIEF COMPLAINT / HPI:   Patient states his mom scheduled appointment for right sided pain. States it has now resolved and is not concerning to him. Did not want to discuss further today.  Patient requested a flu shot  PERTINENT  PMH / PSH: Nicotine dependence  OBJECTIVE:   BP 110/67   Pulse 88   Ht 5\' 4"  (1.626 m)   Wt 106 lb 12.8 oz (48.4 kg)   SpO2 100%   BMI 18.33 kg/m    General: NAD, able to participate in exam HEENT: Normocephalic. White sclera. Respiratory: Normal effort on RA Neuro: Alert, no obvious focal deficits Psych: Normal affect and mood  ASSESSMENT/PLAN:   Health care maintenance Received flu and HPV #2 vaccine today.  Cigarette nicotine dependence without complication Advised smoking cessation.     Dr. , DO [facility]

## 2022-01-08 ENCOUNTER — Other Ambulatory Visit (HOSPITAL_COMMUNITY): Payer: Self-pay | Admitting: Psychiatry

## 2022-01-08 DIAGNOSIS — F411 Generalized anxiety disorder: Secondary | ICD-10-CM

## 2022-01-29 ENCOUNTER — Ambulatory Visit (INDEPENDENT_AMBULATORY_CARE_PROVIDER_SITE_OTHER): Payer: Medicaid Other

## 2022-01-29 DIAGNOSIS — Z23 Encounter for immunization: Secondary | ICD-10-CM

## 2022-01-29 NOTE — Progress Notes (Signed)
Patient presents to nurse clinic for COVID vaccination. Administered in LD, site unremarkable, tolerated injection well. Patient observed for 15 minutes post injection. No signs of adverse reaction observed.  Mele Sylvester C Chakara Bognar, RN  

## 2022-02-11 ENCOUNTER — Ambulatory Visit (INDEPENDENT_AMBULATORY_CARE_PROVIDER_SITE_OTHER): Payer: Medicaid Other | Admitting: Clinical

## 2022-02-11 DIAGNOSIS — F411 Generalized anxiety disorder: Secondary | ICD-10-CM | POA: Diagnosis not present

## 2022-02-11 NOTE — Progress Notes (Signed)
   THERAPIST PROGRESS NOTE  Session Time: 20 minutes  Participation Level: Minimal  Behavioral Response: NAAlertAnxious  Type of Therapy: Individual Therapy  Treatment Goals addressed: client will complete 80% of assigned homework  ProgressTowards Goals: Progressing  Interventions: CBT and Supportive  Summary:  Dustin Tate is a 27 y.o. male who presents for the scheduled appointment oriented times five, appropriately dressed, and friendly. Client denied hallucinations and delusions. Client reported on today he is doing okay. Client reported he is staying at home with his mom and brother. Client reported his goal for this year is to start working. Client reported his mother wants him to continue coming to therapy appointments. Client reported he is managing his depression fine. Client reported he has some good and not so good days. Client reported on his not so good days he feels like he's day dreaming. Client reported he has some anxiety/ stress. Client reported being at home can get stressful so he has to step away from the house. Client reported he tends to be cautious about things. Client reported he'll want to do something but then it "won't feel like the right thing to do". Client reported he continues to use marijuana 2 to 3 times per night. Evidence of progress towards goal:  client GAD7 score is 6 and PHQ9 score is 5.     02/11/2022   10:26 AM 01/29/2021   11:29 AM 08/15/2020   10:39 AM 06/28/2020    3:37 PM  GAD 7 : Generalized Anxiety Score  Nervous, Anxious, on Edge 1 3 0 0  Control/stop worrying 1 1 0 0  Worry too much - different things 1 1 1 1   Trouble relaxing 1 1 1 1   Restless 1 3 2 1   Easily annoyed or irritable 1 3 2 1   Afraid - awful might happen 0 0 0 0  Total GAD 7 Score 6 12 6 4   Anxiety Difficulty Not difficult at all Somewhat difficult Not difficult at all Somewhat difficult   Flowsheet Row Counselor from 02/11/2022 in [facility]  PHQ-9 Total Score 5        Suicidal/Homicidal: Nowithout intent/plan  Therapist Response:  Therapist began the appointment asking the client how he has been doing since last seen. Therapist used CBT to engage using active listening and positive emotional support. Therapist used CBT to engage and ask the client how he has been feeling emotionally with taking medications as prescribed. Therapist used CBT to engage and ask the client to identify continued goals for therapy. Therapist completed SDOH and updated treatment plan. Therapist used CBT ask the client to identify her progress with frequency of use with coping skills with continued practice in her daily activity.    Therapist assigned the client homework to complete 1 to 2 job applications.    Plan: Return again in 4 weeks.  Diagnosis: generalized anxiety disorder  Collaboration of Care: Patient refused AEB none requested by the client.  Patient/Guardian was advised Release of Information must be obtained prior to any record release in order to collaborate their care with an outside provider. Patient/Guardian was advised if they have not already done so to contact the registration department to sign all necessary forms in order for [name] to release information regarding their care.   Consent: Patient/Guardian gives verbal consent for treatment and assignment of benefits for services provided during this visit. Patient/Guardian expressed understanding and agreed to proceed.   [place], LCSW 02/11/2022

## 2022-02-26 ENCOUNTER — Telehealth (HOSPITAL_COMMUNITY): Payer: Self-pay

## 2022-02-26 NOTE — Telephone Encounter (Signed)
Pt's mother came in requesting a medical certificate be completed so that she can add the patient to her insurance. Paperwork is in Dr. Ellan Lambert mailbox.

## 2022-02-26 NOTE — Telephone Encounter (Signed)
I have literally never seen this patient, and it looks like the last saw [place] in August. The paperwork should probably go to his PCP, regardless I will not be filling anything out.

## 2022-03-05 NOTE — Telephone Encounter (Signed)
Patient will need to follow up with primary care to have this paperwork filled out.

## 2022-03-06 ENCOUNTER — Encounter (HOSPITAL_COMMUNITY): Payer: Self-pay | Admitting: Student in an Organized Health Care Education/Training Program

## 2022-03-06 ENCOUNTER — Telehealth (INDEPENDENT_AMBULATORY_CARE_PROVIDER_SITE_OTHER): Payer: Medicaid Other | Admitting: Student in an Organized Health Care Education/Training Program

## 2022-03-06 DIAGNOSIS — F29 Unspecified psychosis not due to a substance or known physiological condition: Secondary | ICD-10-CM | POA: Diagnosis not present

## 2022-03-06 MED ORDER — ARIPIPRAZOLE 5 MG PO TABS: ORAL_TABLET | ORAL | 0 refills | Status: DC

## 2022-03-06 MED ORDER — QUETIAPINE FUMARATE 200 MG PO TABS: ORAL_TABLET | ORAL | 0 refills | Status: DC

## 2022-03-06 MED ORDER — ARIPIPRAZOLE 15 MG PO TABS: 15.0000 mg | ORAL_TABLET | Freq: Every day | ORAL | 2 refills | Status: DC

## 2022-03-06 NOTE — Patient Instructions (Signed)
Medication changes:  Week 1 (7 days) -Start Abilify 5 mg daily - Start 400 mg Seroquel nightly nightly  Week 2 (7 days) - Increase Abilify to 10 mg daily - Decrease Seroquel to 200 mg nightly  Week 3 (7 days) -Increase Abilify to 15 mg daily - Decrease Seroquel to 100 mg nightly  Week 4-until next appointment -Continue Abilify 15 mg daily - Discontinue Seroquel

## 2022-03-06 NOTE — Progress Notes (Signed)
BH MD/PA/NP OP Progress Note  03/06/2022 10:39 AM CORDARRIUS Tate  MRN:  [id number]  Chief Complaint: No chief complaint on file. Virtual Visit via Video Note  I connected with Aquan Q Zeiser on 03/06/22 at 10:30 AM EST by a video enabled telemedicine application and verified that I am speaking with the correct person using two identifiers.  Location: Patient: Car w/ 2 parents Provider: Office   I discussed the limitations of evaluation and management by telemedicine and the availability of in person appointments. The patient expressed understanding and agreed to proceed.  History of Present Illness:   Dustin Tate is a 27 year old male seen today for follow up psychiatric evaluation. He has a psychiatric history of marijuana abuse, depression, SI/SA. He is currently managed on Seroquel 500mg  nightly.  Patient reports that he tries to be complaint but will forget about 2 days a week. Patient reports that he stays with his mom. Patient reports that he does not work. Patient reports that he likes to go walking or play video games. Patient reports that these activities give him joy. Patient reports that he sleeps approx 8-10h and he feels well rested when he wakes up. Patient reports that he eat breakfast and then some snacks.   Patietn reports that over the last few months he has had occasions where he hears mutiple voices that no one can hears. Patient reports that when he ehars the voices it is not scary but "helpful and stressful." Patietn reports that often it will happened when he has no one to talk to, and he can talk to them. Patient reports that sometimes the voices "are disprespectful" but " I can deal with it." Patient denies that the voices tell him to do things.   Patietn reports that sometimes he tries to sleep when he hears too many voices. Patietn reports that he last had AH, in the last week. Patietn denies AH.   Patient reports that over the his mood has been  "unhappy" lately. Patietn reports that he feels that his life is not going "like it should." Patient is not working right now. Patient reports that in his view his concentration is a 8/10 and a 10 is good. Patient denies feeling hopeless or worried.   Patient is not oriented to year says 2023. Patient able to give DOWB.  Patient denies SI and HI.   Spoke with mom: Mom endorses that patietn will forget things frequently.  Mom reports that she thinks patient's compliance with his Seroquel is far worse than he mentioned earlier.  Mom reports that patietn did have IEP growing up and he did have special education that came into the home to work with him 27yo-27yo.Patient was born premature. Mom reports that she thinks patient responds to interal stimuli and has AH daily. Mom reports that he will talk to himself. Mom reports that he has poor focus and he prefers to isolate himself.    Objectively, patient is very distracted possibly responding to internal stimuli during assessment.  Patient's thoughts appear to be limited with hints of loose associations and paranoia.  Patient makes comments about not wanting to let people know certain things and may be intentionally vague or is having thought blocking.  Patient also was very unsure of where he graduated high school despite only being 26.   I discussed the assessment and treatment plan with the patient. The patient was provided an opportunity to ask questions and all were answered. The patient agreed with  the plan and demonstrated an understanding of the instructions.   The patient was advised to call back or seek an in-person evaluation if the symptoms worsen or if the condition fails to improve as anticipated.  I provided 45 minutes of non-face-to-face time during this encounter.   Freida Busman, MD  Visit Diagnosis: No diagnosis found.  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   Social Determinants of Health   Financial Resource Strain: Low Risk  (01/16/2020)   Overall Financial Resource Strain (CARDIA)    Difficulty of Paying Living Expenses: Not hard at all  Food Insecurity: No Food Insecurity (01/16/2020)   Hunger Vital Sign    Worried About Running Out of Food in the Last Year: Never true     Ran Out of Food in the Last Year: Never true  Transportation Needs: No Transportation Needs (01/16/2020)   PRAPARE - Hydrologist (Medical): No    Lack of Transportation (Non-Medical): No  Physical Activity: Insufficiently Active (01/16/2020)   Exercise Vital Sign    Days of Exercise per Week: 3 days    Minutes of Exercise per Session: 30 min  Stress: Stress Concern Present (01/16/2020)   [place]    Feeling of Stress : To some extent  Social Connections: Socially Isolated (01/16/2020)   Social Connection and Isolation Panel [NHANES]    Frequency of Communication with Friends and Family: Twice a week    Frequency of Social Gatherings with Friends and Family: Once a week    Attends  Religious Services: Never    Active Member of Clubs or Organizations: No    Attends Banker Meetings: Never    Marital Status: Never married    Allergies: No Known Allergies  Metabolic Disorder Labs: Lab Results  Component Value Date   HGBA1C 6.1 (H) 07/16/2019   MPG 128.37 07/16/2019   Lab Results  Component Value Date   PROLACTIN 8.6 07/16/2019   Lab Results  Component Value Date   CHOL 137 06/04/2020   TRIG 49 06/04/2020   HDL 46 06/04/2020   CHOLHDL 3.0 06/04/2020   VLDL 8 07/16/2019   LDLCALC 80 06/04/2020   LDLCALC 79 07/16/2019   Lab Results  Component Value Date   TSH 1.009 07/10/2019    Therapeutic Level Labs: No results found for: "LITHIUM" No results found for: "VALPROATE" No results found for: "CBMZ"  Current Medications: Current Outpatient Medications  Medication Sig Dispense Refill   hydrOXYzine (ATARAX) 10 MG tablet Take 1 tablet by mouth three times daily as needed 90 tablet 0   nicotine polacrilex (NICORETTE) 2 MG gum Take 1 each (2 mg total) by mouth as needed for smoking cessation. 100 tablet 0   QUEtiapine (SEROQUEL) 200 MG tablet Take 1 tablet (200  mg total) by mouth at bedtime. 30 tablet 3   QUEtiapine (SEROQUEL) 300 MG tablet Take 1 tablet (300 mg total) by mouth at bedtime. 30 tablet 3   No current facility-administered medications for this visit.     Musculoskeletal: Defer  Psychiatric Specialty Exam: Review of Systems  Psychiatric/Behavioral:  Positive for hallucinations. Negative for suicidal ideas.     There were no vitals taken for this visit.There is no height or weight on file to calculate BMI.  General Appearance: Casual  Eye Contact:  Poor  Speech:  Clear and Coherent and blunt  Volume:  Normal  Mood:  Dysphoric  Affect:  Flat  Thought Process:  Disorganized  Orientation:  Other:  Oriented to day, date and person not oriented to year but not fully surprised when reoriented  Thought Content: Hallucinations: Auditory and Paranoid Ideation appears to be responding to internal stimuli  Suicidal Thoughts:  No  Homicidal Thoughts:  No  Memory:  Immediate;   Poor Recent;   Poor  Judgement:  Impaired  Insight:  Lacking  Psychomotor Activity:  NA  Concentration:  Concentration: Poor  Recall:  Poor  Fund of Knowledge: Poor  Language: Poor  Akathisia:  No  Handed:    AIMS (if indicated): not done  Assets:  Communication Skills Desire for Improvement Housing Leisure Time Resilience Social Support Transportation  ADL's:  Intact  Cognition: Impaired at baseline  Sleep:  Good   Screenings: AUDIT    Flowsheet Row [facility]  Alcohol Use Disorder Identification Test Final Score (AUDIT) 0      GAD-7    Flowsheet Row Counselor from 02/11/2022 in [facility] Video Visit from 01/29/2021 in [facility] Clinical Support from 08/15/2020 in [facility] Clinical Support from 06/28/2020 in [facility] Clinical Support from 04/04/2020  in [facility]  Total GAD-7 Score 6 12 6 4 2       [phone]    Flowsheet Row Counselor from 02/11/2022 in [facility] Office Visit from 01/03/2022 in [facility] Office Visit from 09/05/2021 in [facility]  Video Visit from 01/29/2021 in [facility] Office Visit from 12/03/2020 in [facility]  PHQ-2 Total Score 2 1 4 6  0  PHQ-9 Total Score 5 1 14 21 3       Flowsheet Row ED from 07/22/2020 in [facility] from 04/04/2020 in [place] Error: Question 6 not populated No Risk        Assessment and Plan:  Dustin Tate is a 27 year old male seen today for follow up psychiatric evaluation. He has a psychiatric history of marijuana abuse, depression, SI/SA. He is currently managed on Seroquel 500mg  nightly. Based on assessment today, patient does appear to be on the schizophrenia spectrum he also does have a history of learning disability.  There is previous concern by Dr. Mallie Darting when patient was hospitalized for his first psychotic break the patient may be on the spectrum.  This assessment today also continues to have concern as patient does share some characteristics with repetitive motions and limited comprehension; however patient also does appear to have psychosis endorsing auditory hallucinations and paranoid behaviors.  Patient has very poor compliance with his Seroquel however, mom endorses seeing patient a bit more, he is compliant.  Previous provider also noted that patient's poor compliance was an issue.  Due to this, we will trial patient on Abilify to treat psychotic symptoms and pain can also be beneficial for repetitive behaviors seen in children and adults with autism, despite patient not having this formal diagnosis.  This provider also consider  Risperdal however there is previous concern due to patient's history of epilepsy in 2015 that Risperdal may lower seizure threshold.  Mom was able to provide context endorsing that patient has only had 1 seizure in his whole life, if Abilify is not helpful will try Risperdal due to no formal epilepsy history or subsequent seizures since.  Abilify and Risperdal are preferred due to ability to transfer to [place] which is the intent.  Future visits will be in person, as it was very difficult to maintain attention with patient, and there were many behaviors that could be missed on video visit.  At this time patient is diagnosis appears to be more on schizophrenia spectrum however on known if patient has schizoaffective disorder, depressive type versus paranoid schizophrenia.  Patient does appear to have a history of mood component and continues to endorse low mood but is not able to communicate much as to why this is.  Patient's low mood could also be seen as a negative symptom of his schizophrenia.  We will have to continue to assess patient.  At this time patient does not endorse much marijuana use we will continue to monitor.  Schizophrenia spectrum, unspecified type Patient will be titrated off Seroquel and started on Abilify on the following regimen: Week 1 (7 days) -Start Abilify 5 mg daily - Start 400 mg Seroquel nightly nightly  Week 2 (7 days) - Increase Abilify to 10 mg daily - Decrease Seroquel to 200 mg nightly  Week 3 (7 days) -Increase Abilify to 15 mg daily - Decrease Seroquel to 100 mg nightly  Week 4-until next appointment -Continue Abilify 15 mg daily - Discontinue Seroquel  -Patient will need updated labs at next appointment  Hx THC use disorder -Currently patient endorses decreased access for use; however endorse that should he gain access he will try to decrease use - Continue to monitor  Collaboration  of Care: Collaboration of Care:   Patient/Guardian was advised Release of  Information must be obtained prior to any record release in order to collaborate their care with an outside provider. Patient/Guardian was advised if they have not already done so to contact the registration department to sign all necessary forms in order for [name] to release information regarding their care.   Consent: Patient/Guardian gives verbal consent for treatment and assignment of benefits for services provided during this visit. Patient/Guardian expressed understanding and agreed to proceed.   PGY-3 Freida Busman, MD 03/06/2022, 10:39 AM

## 2022-03-10 ENCOUNTER — Telehealth: Payer: Self-pay | Admitting: Student

## 2022-03-10 ENCOUNTER — Telehealth (HOSPITAL_COMMUNITY): Payer: Self-pay

## 2022-03-10 ENCOUNTER — Ambulatory Visit (HOSPITAL_COMMUNITY): Payer: Medicaid Other | Admitting: Clinical

## 2022-03-10 NOTE — Telephone Encounter (Signed)
Patient's mother dropped of a request for a medical certificate to add patient to her insurance. Last DOS was 09/05/21. Placed in [facility] folder

## 2022-03-10 NOTE — Telephone Encounter (Signed)
Noted, I am not really sure what to do. I called the pharmacy Ill try coverMymeds because I cannot use the number provided.

## 2022-03-11 NOTE — Telephone Encounter (Signed)
Clinical info completed on form.  Placed form in PCP's box for completion.    When form is completed, please route note to "RN Team" and place in wall pocket in front office.   Lashay Osborne, CMA  

## 2022-03-12 ENCOUNTER — Telehealth (HOSPITAL_COMMUNITY): Payer: Self-pay | Admitting: *Deleted

## 2022-03-12 NOTE — Telephone Encounter (Signed)
Fax received for approval of Aripiprazole 15mg . Approved 03/12/22-03/12/2023. #[id number].

## 2022-03-17 NOTE — Telephone Encounter (Signed)
Spoke with mom. Made appt for 2/14 at 9:50. Salvatore Marvel, CMA

## 2022-03-19 ENCOUNTER — Encounter: Payer: Self-pay | Admitting: Student

## 2022-03-19 ENCOUNTER — Ambulatory Visit (INDEPENDENT_AMBULATORY_CARE_PROVIDER_SITE_OTHER): Payer: Medicaid Other | Admitting: Student

## 2022-03-19 VITALS — BP 118/76 | HR 68 | Wt 110.8 lb

## 2022-03-19 DIAGNOSIS — F29 Unspecified psychosis not due to a substance or known physiological condition: Secondary | ICD-10-CM | POA: Diagnosis not present

## 2022-03-19 NOTE — Patient Instructions (Addendum)
It was great to see you today! Thank you for choosing [facility] for your primary care. Dustin Tate was seen for medical certificate.  Today we addressed: -I will have the certificate ready tomorrow   If you haven't already, sign up for My Chart to have easy access to your labs results, and communication with your primary care physician.  I recommend that you always bring your medications to each appointment as this makes it easy to ensure you are on the correct medications and helps [name] not miss refills when you need them. Call the clinic at [phone] if your symptoms worsen or you have any concerns.  You should return to our clinic Return if symptoms worsen or fail to improve. Please arrive 15 minutes before your appointment to ensure smooth check in process.  We appreciate your efforts in making this happen.  Thank you for allowing me to participate in your care, [name], MD 03/19/2022, 10:41 AM PGY-2, [place]

## 2022-03-19 NOTE — Progress Notes (Unsigned)
  SUBJECTIVE:   CHIEF COMPLAINT / HPI:   Coming in to discuss medical certificate request.   Schizophrenia spectrum: Patient also has a history of marijuana use, depression, SI/SA.  He was seen at [facility] OP.  For follow-up patient was previously on Seroquel.  At his appointment with [facility], the patient was experiencing auditory hallucinations and paranoid behaviors.  He had poor compliance with Seroquel at this point.  They discussed adding Abilify to his regimen.  Ultimately, the decision was made to start him on Abilify and to gradually decrease the Seroquel until he was titrated off of it.  Patient has a history of learning disability, has had IEP's when he was in school.  Provided with medical certificate request to keep the patient on his parents insurance past the age of 79.  PERTINENT  PMH / PSH:  Schizophrenia spectrum Learning Disability  Depression  H/o SI Substance induced mood disorder.  OBJECTIVE:  BP 118/76   Pulse 68   Wt 110 lb 12.8 oz (50.3 kg)   SpO2 99%   BMI 19.02 kg/m  Physical Exam  General: Alert in no distress, responsive but withdrawn to questioning  Heart: Regular rate and rhythm with no murmurs appreciated Lungs: CTA bilaterally, no wheezing Abdomen: Bowel sounds present, no abdominal pain Skin: Warm and dry Psych: Withdrawn and difficulty with appropriate response, mother in room with patient    ASSESSMENT/PLAN:  Schizophrenia spectrum disorder with psychotic disorder type not yet determined [facility]) Assessment & Plan: Patient follows closely with behavioral health for medication regimen and outpatient treatment. Discussed condition with mother and details of current condition. Patient lives at home with her.  Provided mother with medical information requested based on chart review for medical certificate.  Currently, titrating off of Seroquel while increasing Abilify dosage.    Return if symptoms worsen or fail to improve. [name], MD 03/20/2022,  11:19 AM PGY-2, [place]

## 2022-03-20 ENCOUNTER — Encounter: Payer: Self-pay | Admitting: Student

## 2022-03-20 NOTE — Assessment & Plan Note (Addendum)
Patient follows closely with behavioral health for medication regimen and outpatient treatment. Discussed condition with mother and details of current condition. Patient lives at home with her.  Provided mother with medical information requested based on chart review for medical certificate.  Currently, titrating off of Seroquel while increasing Abilify dosage.

## 2022-03-20 NOTE — Telephone Encounter (Signed)
Patient's mother called,LVM and informed that forms are ready for pick up. Copy made and placed in batch scanning. Original placed at front desk for pick up.   Talbot Grumbling, RN

## 2022-03-25 ENCOUNTER — Ambulatory Visit (HOSPITAL_COMMUNITY): Payer: Medicaid Other | Admitting: Clinical

## 2022-04-25 ENCOUNTER — Other Ambulatory Visit: Payer: Self-pay

## 2022-04-25 ENCOUNTER — Telehealth (HOSPITAL_COMMUNITY): Payer: Self-pay | Admitting: Student in an Organized Health Care Education/Training Program

## 2022-04-25 ENCOUNTER — Encounter (HOSPITAL_COMMUNITY): Payer: Self-pay | Admitting: Student in an Organized Health Care Education/Training Program

## 2022-04-25 ENCOUNTER — Ambulatory Visit (INDEPENDENT_AMBULATORY_CARE_PROVIDER_SITE_OTHER): Payer: Medicaid Other | Admitting: Student in an Organized Health Care Education/Training Program

## 2022-04-25 VITALS — BP 143/82 | HR 99 | Resp 20 | Ht 63.0 in | Wt 118.0 lb

## 2022-04-25 DIAGNOSIS — F29 Unspecified psychosis not due to a substance or known physiological condition: Secondary | ICD-10-CM | POA: Diagnosis not present

## 2022-04-25 MED ORDER — ARIPIPRAZOLE 15 MG PO TABS: 15.0000 mg | ORAL_TABLET | Freq: Every day | ORAL | 2 refills | Status: DC

## 2022-04-25 NOTE — Patient Instructions (Addendum)
[facility] Info Address: [address], [place],  [address] Phone: [phone]   Medication Changes 1.Continue Aripiprazole  2. Changes to Seroquel  Start 400 mg Seroquel (2 of the 200mg  tablets) nightly for 7 days followed by:  Decrease Seroquel to 200 mg (1 tablet)  nightly for 7 days followed by:  Decrease Seroquel to 100 mg (0.5 tablet) nightly for 7 days then STOP

## 2022-04-25 NOTE — Addendum Note (Signed)
Addended by: Damita Dunnings B on: 04/25/2022 01:03 PM   Modules accepted: Orders

## 2022-04-25 NOTE — Progress Notes (Addendum)
[place] MD/PA/NP OP Progress Note  04/25/2022 12:56 PM Dustin Tate  MRN:  [id number]  Chief Complaint:  Chief Complaint  Patient presents with   Follow-up   HPI: Dustin Tate is a 27 year old male seen today for follow up psychiatric evaluation. He has a psychiatric history of marijuana abuse, depression, SI/SA.   Mom had some difficulty with the instructions. Patient is on Abilify 15mg  but he is still taking Seroquel 500mg  QHS. Mom reports that the patient's talking to himself and hearing voices has decreased.  Patient has been sleeping more. Mom reports that the patient eating is still a problem, some days he will skip meals.   Patient reports that he may skip some meals due to not liking the taste of blander foods like grits plain. Patient reports that he will also skip a meal if he feels like he ate enough before and he does not want to have the gassy, bloated feeling. He reports that sometimes he will drink a lot of water or eat fruit. He reports that he likes fruit and feels that it is less likely to hurt his stomach than other foods. He reports that his stomach will hurt on scale 8/10 with 10 being the worse.   Patient reports that he is getting approx 6 hrs naps during the day and 5 hr sleep at night. Patient reports that he likes to play videogames and watch TV. Patient reports that he does not really find a lot of stuff to watch on TV. Mom endorses that he likes to watch videos. Patient reports that the [facility] are less loud. Patient reports that the last AH was few min ago telling him "water." Patient reports that the voices can sound like inside his head or outside. Patient reports that the voices can be comforting and other times they can be irritating. Patient reports that he may sometimes talk back to them. Patient reports that the ones he talks back to feel like friends.  Mom endorses that she is pretty sure that the voices he will talk to our family members, as she can recognize the  names.  Patient reports that he does not really have any friends. Patietn denies VH, SI, and HI. Patient reports that his mood is good "5/5." Patient denies feeling worried or nervous. Patient reports that he likes to stay on time, and he rushes himself but he is not sure for what exactly he needs to be on time for.   Mom reports that patient is bored at home and will pace around the house and she thinks this is due to him being bored.  Tobacco: 2-3 black andmilds Etoh- rare THC- no EKC- Qtc- 430  Visit Diagnosis:    ICD-10-CM   1. Schizophrenia spectrum disorder with psychotic disorder type not yet determined ([place])  F29 EKG 12-Lead      Past Psychiatric History:  Inpatient: Brief psychotic disorder-hospitalized at [facility] 07/2019 approximately 9 days today, only inpatient hospitalization Discharged on Zoloft 50 mg and Seroquel 500 mg nightly Outpatient: At the [facility] behavioral health clinic Therapy: Patient had in-home therapy from ages [age] Patient had an IEP from kindergarten through 12th grade Patient was born 2 weeks premature  Last visit 03/06/2022-patient was in the car with his parents.  Patient was very distracted it was difficult to get a full assessment in patient's.  Did speak to mom there was a lot of concern that patient Seroquel 500 mg is not beneficial as he was  having a lot of AVH in responding to internal stimuli.  Medication adjustments were made to titrate patient off of Seroquel and titrated onto Abilify up to 15 mg.    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 No pertinent surgical history.  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   Social Determinants of Health   Financial Resource Strain: Low Risk  (01/16/2020)   Overall Financial Resource Strain (CARDIA)    Difficulty of Paying Living Expenses: Not hard at all  Food Insecurity: No Food Insecurity (01/16/2020)   Hunger Vital Sign    Worried About Running Out of Food in the Last Year: Never true    Ran Out of Food in the Last Year: Never true  Transportation Needs: No Transportation Needs (01/16/2020)   PRAPARE - Hydrologist (Medical): No    Lack of Transportation (Non-Medical): No  Physical Activity: Insufficiently Active (01/16/2020)   Exercise Vital Sign    Days of Exercise per Week: 3 days    Minutes of Exercise per Session: 30 min  Stress: Stress Concern Present (01/16/2020)   [place]    Feeling of Stress : To some extent  Social Connections: Socially Isolated (01/16/2020)   Social Connection and Isolation Panel [NHANES]    Frequency of Communication with Friends and Family: Twice a week    Frequency of Social Gatherings with Friends and Family: Once a week    Attends Religious Services: Never    Marine scientist or  Organizations: No    Attends Music therapist: Never    Marital Status: Never married    Allergies: No Known Allergies  Metabolic Disorder Labs: Lab Results  Component Value Date   HGBA1C 6.1 (H) 07/16/2019   MPG 128.37 07/16/2019   Lab Results  Component Value Date   PROLACTIN 8.6 07/16/2019   Lab Results  Component Value Date   CHOL 137 06/04/2020   TRIG 49 06/04/2020   HDL 46 06/04/2020   CHOLHDL 3.0 06/04/2020   VLDL 8 07/16/2019   [place] 80 06/04/2020   LDLCALC 79 07/16/2019  Lab Results  Component Value Date   TSH 1.009 07/10/2019    Therapeutic Level Labs: No results found for: "LITHIUM" No results found for: "VALPROATE" No results found for: "CBMZ"  Current Medications: Current Outpatient Medications  Medication Sig Dispense Refill   ARIPiprazole (ABILIFY) 15 MG tablet Take 1 tablet (15 mg total) by mouth daily. 30 tablet 2   ARIPiprazole (ABILIFY) 5 MG tablet Take 1 tablet (5 mg total) by mouth daily for 7 days, THEN 2 tablets (10 mg total) daily for 7 days. 21 tablet 0   hydrOXYzine (ATARAX) 10 MG tablet Take 1 tablet by mouth three times daily as needed 90 tablet 0   QUEtiapine (SEROQUEL) 200 MG tablet Take 2 tablets (400 mg total) by mouth at bedtime for 7 days, THEN 1 tablet (200 mg total) at bedtime for 7 days, THEN 0.5 tablets (100 mg total) at bedtime for 7 days. 25 tablet 0   No current facility-administered medications for this visit.     Musculoskeletal: Strength & Muscle Tone: within normal limits Gait & Station: normal Patient leans: N/A No rigidity noted on exam   Psychiatric Specialty Exam: Review of Systems  Psychiatric/Behavioral:  Positive for hallucinations. Negative for behavioral problems, dysphoric mood, sleep disturbance and suicidal ideas. The patient is not hyperactive.     Blood pressure (!) 143/82, pulse 99, resp. rate 20, height 5\' 3"  (1.6 m), weight 118 lb (53.5 kg), SpO2 100 %.Body mass index is 20.9  kg/m.  General Appearance: Casual  Eye Contact:  Minimal patient is aware that he has poor eye contact  Speech:  Clear and Coherent very short, but pleasant  Volume:  Normal  Mood:  Euthymic  Affect:  Restricted  Thought Process:  Goal Directed, concrete  Orientation:  Full (Time, Place, and Person)  Thought Content: Ideas of Reference:   Delusions hearing voices of family members and other people he may not be able to identify.  He will respond to the family members which she finds comforting  Suicidal Thoughts:  No  Homicidal Thoughts:  No  Memory:  Immediate;   Fair Recent;   Fair  Judgement:  Fair  Insight:  Lacking  Psychomotor Activity:  Normal  Concentration:  Concentration: Fair-significant improvement from last visit  Recall:  NA  Fund of Knowledge:  Limited  Language:  Fair  Akathisia:  No  Handed:    AIMS (if indicated): done  Assets:  Desire for Improvement Housing Resilience Social Support  ADL's:  Intact  Cognition: Impaired, not formally assessed  Sleep:  Good   Screenings: [place] from 04/25/2022 in [place] Total Score 0      AUDIT    [place] Admission (Discharged) from 07/08/2019 in [place] [facility]  Alcohol Use Disorder Identification Test Final Score (AUDIT) 0      GAD-7    Flowsheet Row Counselor from 02/11/2022 in [facility] Video Visit from 01/29/2021 in [place] from 08/15/2020 in [place] from 06/28/2020 in [place] from 04/04/2020 in [facility]  Total GAD-7 Score 6 12 6 4 2       PHQ2-9    Flowsheet Row Counselor from 02/11/2022 in [facility] Office Visit from 01/03/2022 in [place]  Office Visit from 09/05/2021 in  [place] Video Visit from 01/29/2021 in [facility] Office Visit from 12/03/2020 in [place]  PHQ-2 Total Score 2 1 4 6  0  PHQ-9 Total Score 5 1 14 21 3       Flowsheet Row ED from 07/22/2020 in [facility] from 04/04/2020 in [place] Error: Question 6 not populated No Risk        Assessment and Plan:  Dustin Tate is a 27 year old male seen today for follow up psychiatric evaluation. He has a psychiatric history of marijuana abuse, depression, SI/SA.  Based on assessment today patient appears to be doing fairly well on dual Seroquel and Abilify however, as patient is on a high dose of Seroquel, would like to titrate patient off with the goal of being on an antipsychotic monotherapy due to increased risk associated with both medications.  Patient did not appear to have any muscle rigidity or adverse side effects.  Patient's EKG was not concerning for QTc prolongation.  Interestingly patient being on both medications he has had significant decrease in his auditory hallucinations and responding to internal stimuli.  There is some concern that patient may be on the spectrum and some of his auditory hallucinations are comforting voices that provide him with social interaction that he is very limited in currently.  Discussion was had with patient about limited social interaction, and while he has had friends in the past he endorsed that he has not been making many lately and has been staying in the home.  Patient was open and willing to try an sanctuary house for socialization purposes, to which mom was in agreement.  Also we will continue to monitor patient's appetite and this may be secondary to another issue outside the patient's mental health.  Patient will follow-up with PCP regarding this.   Patient's sleep is likely secondary to his high dose of Seroquel will likely improve as this is titrated off.  Schizophrenia spectrum, unspecified type -Patient will be titrated off Seroquel 500 mg nightly Start 400 mg Seroquel (2 of the 200mg  tablets) nightly for 7 days followed by:  Decrease Seroquel to 200 mg (1 tablet)  nightly for 7 days followed by:  Decrease Seroquel to 100 mg (0.5 tablet) nightly for 7 days then STOP   -Continue Abilify 15 mg daily -Patient will need to get updated labs, will get him scheduled   -Follow-up in 6 weeks -Referral per sanctuary house, family will reach out first  Collaboration of Care: Collaboration of Care:   Patient/Guardian was advised Release of Information must be obtained prior to any record release in order to collaborate their care with an outside provider. Patient/Guardian was advised if they have not already done so to contact the registration department to sign all necessary forms in order for [name] to release information regarding their care.   Consent: Patient/Guardian gives verbal consent for treatment and assignment of benefits for services provided during this visit. Patient/Guardian expressed understanding and agreed to proceed.   PGY-3 Freida Busman, MD 04/25/2022, 12:56 PM

## 2022-04-28 ENCOUNTER — Ambulatory Visit (INDEPENDENT_AMBULATORY_CARE_PROVIDER_SITE_OTHER): Payer: Medicaid Other | Admitting: Student

## 2022-04-28 ENCOUNTER — Ambulatory Visit (INDEPENDENT_AMBULATORY_CARE_PROVIDER_SITE_OTHER): Payer: Medicaid Other | Admitting: Clinical

## 2022-04-28 ENCOUNTER — Encounter: Payer: Self-pay | Admitting: Student

## 2022-04-28 VITALS — BP 122/80 | HR 82 | Ht 64.0 in | Wt 118.0 lb

## 2022-04-28 DIAGNOSIS — F29 Unspecified psychosis not due to a substance or known physiological condition: Secondary | ICD-10-CM

## 2022-04-28 DIAGNOSIS — R030 Elevated blood-pressure reading, without diagnosis of hypertension: Secondary | ICD-10-CM

## 2022-04-28 NOTE — Assessment & Plan Note (Signed)
Released lab orders with Dr. Ellan Lambert office, will obtain here. Maintenance labs for APs

## 2022-04-28 NOTE — Patient Instructions (Addendum)
It was great to see you today! Thank you for choosing [facility] for your primary care. Dustin Tate was seen for blood pressure follow up.  Today we addressed: Follow up in a couple of months  I will grab some labs today per psychiatry   If you haven't already, sign up for My Chart to have easy access to your labs results, and communication with your primary care physician.  I recommend that you always bring your medications to each appointment as this makes it easy to ensure you are on the correct medications and helps [name] not miss refills when you need them. Call the clinic at [phone] if your symptoms worsen or you have any concerns.  You should return to our clinic Return in about 3 months (around 07/29/2022) for Follow up . Please arrive 15 minutes before your appointment to ensure smooth check in process.  We appreciate your efforts in making this happen.  Thank you for allowing me to participate in your care, Dustin Emery, MD 04/28/2022, 11:29 AM PGY-2, [place]

## 2022-04-28 NOTE — Progress Notes (Unsigned)
   THERAPIST PROGRESS NOTE  Session Time: 20 minutes  Participation Level: Minimal  Behavioral Response: CasualAlertEuthymic  Type of Therapy: Individual Therapy  Treatment Goals addressed: client will complete 80% of assigned homework  ProgressTowards Goals: Not Progressing  Interventions: CBT and Supportive  Summary:  Dustin Tate is a 27 y.o. male who presents for the scheduled appointment oriented times five, appropriately dressed, and friendly. Client denied hallucinations and delusions. Client reported on today he is doing good. Client reported he has seen the psychiatrist and his medication was adjusted. Client reported he is responding well to the adjustment. Client reported emotionally he has been feeling good. Client reported his good mood is mostly so long as he is by himself at home. Client reported with the medication change he is sleeping well but still wakes up tired. Client reported he has appetite is improving as opposed to going a day without eating. While being asked about of symptoms of any negative emotions the client mentioned irritability but retracted to discuss factors of irritability. Client reported he changed his mind and did not want to discuss. Client reported he did not get around to doing last session homework assignment because he lost energy to do so.  Evidence of progress towards goal:  client reported he is medication compliant 7 days per week. Client reported 0 use of coping skills for anger/ irritability. Client reported he did not apply to at least 1 job as previously discussed.   Suicidal/Homicidal: Nowithout intent/plan  Therapist Response:  Therapist began the appointment asking the client how he has been doing. Therapist used CBT to engage using active listening and positive emotional support. Therapist used CBT to engage and ask the client about medication compliance and response to it. Therapist used CBT to ask the client about symptoms  of depression and irritability if present and try to elaborate on triggers. Therapist used CBT to encourage him to scope out places he would like to work as he indicated he has been thinking about doing. Therapist used CBT ask the client to identify her progress with frequency of use with coping skills with continued practice in her daily activity.    Therapist assigned the client homework to practice self care.   Plan: Return again in 3 weeks.  Diagnosis: schizophrenia spectrum disorder with psychotic disorder not yet determined   Collaboration of Care: Patient refused AEB none requested by the client.  Patient/Guardian was advised Release of Information must be obtained prior to any record release in order to collaborate their care with an outside provider. Patient/Guardian was advised if they have not already done so to contact the registration department to sign all necessary forms in order for [name] to release information regarding their care.   Consent: Patient/Guardian gives verbal consent for treatment and assignment of benefits for services provided during this visit. Patient/Guardian expressed understanding and agreed to proceed.   [place], LCSW 04/28/2022

## 2022-04-28 NOTE — Assessment & Plan Note (Signed)
BP: 122/80 today. Well controlled not on antihypertensives Continue to work on healthy dietary habits and exercise. Follow up in 3 months.   Medication regimen: N/A

## 2022-04-28 NOTE — Progress Notes (Signed)
  SUBJECTIVE:   CHIEF COMPLAINT / HPI:   Blood Pressure F/U: BP: 122/80 today. Home medications include: None for BP.  Does not check blood pressure at home. BP was high at last visit with Dr. Candie Chroman, instructed to return her for recheck. Asympomatic.  Most recent creatinine trend:  Lab Results  Component Value Date   CREATININE 0.90 06/04/2020   CREATININE 0.81 07/07/2019   CREATININE 0.91 07/18/2013   Patient has not had a BMP in the past 1 year. Not indicated as not a true HTN dx.   Schizophrenia, sees Rhea for medication alterations. Has pending labs through [facility].    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 Well controlled not on antihypertensives Continue to work on healthy dietary habits and exercise. Follow up in 3 months.   Medication regimen: N/A    Schizophrenia spectrum disorder with psychotic disorder type not yet determined [facility]) Assessment & Plan: Released lab orders with Dr. Ellan Lambert office, will obtain here. Maintenance labs for APs  Orders: -     CBC with Differential/Platelet -     TSH -     Comprehensive metabolic panel -     Hemoglobin A1c -     Lipid panel   Return in about 3 months (around 07/29/2022) for Follow up . [name], MD 04/28/2022, 11:48 AM PGY-2, [place]

## 2022-04-29 LAB — COMPREHENSIVE METABOLIC PANEL
ALT: 21 IU/L (ref 0–44)
AST: 19 IU/L (ref 0–40)
Albumin/Globulin Ratio: 2 (ref 1.2–2.2)
Albumin: 4.4 g/dL (ref 4.3–5.2)
Alkaline Phosphatase: 57 IU/L (ref 44–121)
BUN/Creatinine Ratio: 8 — ABNORMAL LOW (ref 9–20)
BUN: 7 mg/dL (ref 6–20)
Bilirubin Total: 0.2 mg/dL (ref 0.0–1.2)
CO2: 25 mmol/L (ref 20–29)
Calcium: 9.6 mg/dL (ref 8.7–10.2)
Chloride: 102 mmol/L (ref 96–106)
Creatinine, Ser: 0.91 mg/dL (ref 0.76–1.27)
Globulin, Total: 2.2 g/dL (ref 1.5–4.5)
Glucose: 89 mg/dL (ref 70–99)
Potassium: 4.6 mmol/L (ref 3.5–5.2)
Sodium: 140 mmol/L (ref 134–144)
Total Protein: 6.6 g/dL (ref 6.0–8.5)
eGFR: 120 mL/min/{1.73_m2} (ref >59)

## 2022-04-29 LAB — CBC WITH DIFFERENTIAL/PLATELET
Basophils Absolute: 0 10*3/uL (ref 0.0–0.2)
Basos: 1 %
EOS (ABSOLUTE): 0 10*3/uL (ref 0.0–0.4)
Eos: 1 %
Hematocrit: 40.3 % (ref 37.5–51.0)
Hemoglobin: 13.6 g/dL (ref 13.0–17.7)
Immature Grans (Abs): 0 10*3/uL (ref 0.0–0.1)
Immature Granulocytes: 0 %
Lymphocytes Absolute: 0.9 10*3/uL (ref 0.7–3.1)
Lymphs: 23 %
MCH: 26.6 pg (ref 26.6–33.0)
MCHC: 33.7 g/dL (ref 31.5–35.7)
MCV: 79 fL (ref 79–97)
Monocytes Absolute: 0.3 10*3/uL (ref 0.1–0.9)
Monocytes: 9 %
Neutrophils Absolute: 2.7 10*3/uL (ref 1.4–7.0)
Neutrophils: 66 %
Platelets: 262 10*3/uL (ref 150–450)
RBC: 5.11 x10E6/uL (ref 4.14–5.80)
RDW: 14.2 % (ref 11.6–15.4)
WBC: 4 10*3/uL (ref 3.4–10.8)

## 2022-04-29 LAB — LIPID PANEL
Chol/HDL Ratio: 3 ratio (ref 0.0–5.0)
Cholesterol, Total: 150 mg/dL (ref 100–199)
HDL: 50 mg/dL (ref >39)
LDL Chol Calc (NIH): 89 mg/dL (ref 0–99)
Triglycerides: 51 mg/dL (ref 0–149)
VLDL Cholesterol Cal: 11 mg/dL (ref 5–40)

## 2022-04-29 LAB — HEMOGLOBIN A1C
Est. average glucose Bld gHb Est-mCnc: 111 mg/dL
Hgb A1c MFr Bld: 5.5 % (ref 4.8–5.6)

## 2022-04-29 LAB — TSH: TSH: 1.05 u[IU]/mL (ref 0.450–4.500)

## 2022-06-13 ENCOUNTER — Encounter (HOSPITAL_COMMUNITY): Payer: Medicaid Other | Admitting: Student in an Organized Health Care Education/Training Program

## 2022-06-23 ENCOUNTER — Ambulatory Visit (INDEPENDENT_AMBULATORY_CARE_PROVIDER_SITE_OTHER): Payer: Medicaid Other | Admitting: Student in an Organized Health Care Education/Training Program

## 2022-06-23 ENCOUNTER — Encounter (HOSPITAL_COMMUNITY): Payer: Self-pay | Admitting: Student in an Organized Health Care Education/Training Program

## 2022-06-23 DIAGNOSIS — F29 Unspecified psychosis not due to a substance or known physiological condition: Secondary | ICD-10-CM

## 2022-06-23 MED ORDER — ARIPIPRAZOLE 20 MG PO TABS: 20.0000 mg | ORAL_TABLET | Freq: Every day | ORAL | 2 refills | Status: DC

## 2022-06-23 NOTE — Progress Notes (Signed)
BH MD/PA/NP OP Progress Note  06/23/2022 4:41 PM Dustin Tate  MRN:  [id number]  Chief Complaint:  Chief Complaint  Patient presents with   Follow-up   HPI: Dustin Tate is a 27 year old male seen today for follow up psychiatric evaluation. He has a psychiatric history of marijuana abuse, depression, SI/SA.  Current medication regimen: Abilify 15 mg daily    Mom reports that she feels things are going well, he is compliant with the medication. Patient reports that he see's no difference. Patient repotrs that he still hears voices and they may bother him more often. Patient reports that the voice talks about everything he does. He does feel like he can ignore it. Patient reports that he hears multiple voices that may be in out outside his head and sound like a whisper. Patient reports that they don't scare him, but he would prefer if they left him alone. The only time they leave him alone is when he is doing things. He enjoys gaming, and reports that they don't bother him. He reports his mood is 8-9/10 with 10 being the best. Patient reports that it has been more difficult to sleep, he averages 3 days a week where he doesn't falls asleep, and may got a little under 48hrs without sleep. Patient reports that he may feel like he has too much energy to go to sleep sometimes. Patient denies SI, HI, and AVH. Patient reports that he does think that someone could be watching him through the TV, but he does not let it bother him much. Patient reports that he does not really like to watch TV unless it is sports. Patient endorses that he has feels worried but is not sure why, he reports that this comes and goes.   Patient is interested in working in stocking or packing.   Mom reports that she feels like it is an improvement, she has not seen him responding to internal stimuli. Mom also thinks that patient is sleeping well. Mom reports that patient will watch movies or a game on TV. Mom feels he is  wanting to get out more.   Patient reports that he wants to stay under 120lbs, if he eats more he calls it "being fat." Patient reports that he does spend time evaluating himself for changes in the mirror. Patient reports that he has the thought to purge, but has not. Patient reports that he will have these thoughts when he thinks he ate too much. Bowel habits- 2x/ day.  Patient reports that if he doesn't have black milds he may feel down or more anxious. Mom reports that patient is eating well at home  and he has been picking up weight.    Visit Diagnosis:    ICD-10-CM   1. Schizophrenia spectrum disorder with psychotic disorder type not yet determined (HCC)  F29 ARIPiprazole (ABILIFY) 20 MG tablet      Past Psychiatric History: Inpatient: Brief psychotic disorder-hospitalized at [facility] 07/2019 approximately 9 days today, only inpatient hospitalization Discharged on Zoloft 50 mg and Seroquel 500 mg nightly Outpatient: At the [facility] behavioral health clinic Therapy: Patient had in-home therapy from ages [age] Patient had an IEP from kindergarten through 12th grade Patient was born 2 weeks premature   Last visit 03/06/2022-patient was in the car with his parents.  Patient was very distracted it was difficult to get a full assessment in patient's.  Did speak to mom there was a lot of concern that patient Seroquel 500  mg is not beneficial as he was having a lot of AVH in responding to internal stimuli.  Medication adjustments were made to titrate patient off of Seroquel and titrated onto Abilify up to 15 mg.  04/2022- patient appears to be doing fairly well on dual Seroquel and Abilify however, as patient is on a high dose of Seroquel, would like to titrate patient off with the goal of being on an antipsychotic monotherapy due to increased risk associated with both medications. being on both medications he has had significant decrease in his auditory hallucinations and responding to internal  stimuli.   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 No pertinent surgical history.  Family Psychiatric History: Unknown   Family History:  Family History  Problem Relation Age of Onset   Healthy Mother    Healthy Father     Social History:  Social History   Socioeconomic History   Marital status: Single    Spouse name: Not on file   Number of children: Not on file   Years of education: Not on file   Highest education level: Not on file  Occupational History   Occupation: Health and safety inspector: [employer]    Comment: Studies Primary school teacher  Tobacco Use   Smoking status: Former    Packs/day: 0.10    Years: 5.00    Additional pack years: 0.00    Total pack years: 0.50    Types: Cigarettes    Start date: 2016   Smokeless tobacco: Never  Vaping Use   Vaping Use: Not on file  Substance and Sexual Activity   Alcohol use: No   Drug use: Not Currently    Types: Marijuana   Sexual activity: Not Currently    Partners: Female    Birth control/protection: Condom  Other Topics Concern   Not on file  Social History Scientist, 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   Social Determinants of Health   Financial Resource Strain: Low Risk  (01/16/2020)   Overall Financial Resource Strain (CARDIA)    Difficulty of Paying Living Expenses: Not hard at all  Food Insecurity: No Food Insecurity (01/16/2020)   Hunger Vital Sign    Worried About Running Out of Food in the Last Year: Never true    Ran Out of Food in the Last Year: Never true  Transportation Needs: No Transportation Needs (01/16/2020)   PRAPARE - Administrator, Civil Service (Medical): No    Lack of Transportation (Non-Medical): No  Physical Activity: Insufficiently Active (01/16/2020)   Exercise Vital Sign    Days of Exercise per Week: 3 days    Minutes of Exercise per Session: 30 min  Stress:  Stress Concern Present (01/16/2020)   [employer] of Occupational Health - Occupational Stress Questionnaire    Feeling of Stress : To some extent  Social Connections: Socially Isolated (01/16/2020)   Social Connection and Isolation Panel [NHANES]    Frequency of Communication with Friends and Family: Twice a week    Frequency of Social Gatherings with Friends and Family: Once a week    Attends Religious Services: Never    Database administrator or Organizations: No    Attends Banker Meetings: Never    Marital Status: Never married    Allergies: No Known Allergies  Metabolic Disorder Labs: Lab  Results  Component Value Date   HGBA1C 5.5 04/28/2022   MPG 128.37 07/16/2019   Lab Results  Component Value Date   PROLACTIN 8.6 07/16/2019   Lab Results  Component Value Date   CHOL 150 04/28/2022   TRIG 51 04/28/2022   HDL 50 04/28/2022   CHOLHDL 3.0 04/28/2022   VLDL 8 07/16/2019   LDLCALC 89 04/28/2022   LDLCALC 80 06/04/2020   Lab Results  Component Value Date   TSH 1.050 04/28/2022   TSH 1.009 07/10/2019    Therapeutic Level Labs: No results found for: "LITHIUM" No results found for: "VALPROATE" No results found for: "CBMZ"  Current Medications: Current Outpatient Medications  Medication Sig Dispense Refill   ARIPiprazole (ABILIFY) 20 MG tablet Take 1 tablet (20 mg total) by mouth daily. 30 tablet 2   hydrOXYzine (ATARAX) 10 MG tablet Take 1 tablet by mouth three times daily as needed 90 tablet 0   No current facility-administered medications for this visit.     Musculoskeletal: Strength & Muscle Tone: within normal limits Gait & Station: normal Patient leans: N/A  Psychiatric Specialty Exam: Review of Systems  Psychiatric/Behavioral:  Positive for hallucinations. Negative for dysphoric mood and suicidal ideas.     Blood pressure 131/77, pulse (!) 107, resp. rate 20, weight 116 lb (52.6 kg), SpO2 100 %.Body mass index is 19.91 kg/m.   General Appearance: Casual  Eye Contact:  Poor  Speech:  Clear and Coherent  Volume:  Decreased  Mood:  Euthymic  Affect:  Restricted  Thought Process:  Goal Directed  Orientation:  Full (Time, Place, and Person)  Thought Content: Hallucinations: Auditory Delusions  Suicidal Thoughts:  No  Homicidal Thoughts:  No  Memory:  Immediate;   Good Recent;   Fair  Judgement:  Fair  Insight:  Lacking  Psychomotor Activity:  Normal  Concentration:  Concentration: Fair  Recall:  NA  Fund of Knowledge: Fair  Language: Fair  Akathisia:  No  Handed:    AIMS (if indicated): not done  Assets:  Desire for Improvement Housing Resilience Social Support  ADL's:  Intact  Cognition: WNL  Sleep:  Fair   Screenings: AIMS    Flowsheet Row Clinical Support from 04/25/2022 in [facility]  AIMS Total Score 0      AUDIT    Flowsheet Row [facility]  Alcohol Use Disorder Identification Test Final Score (AUDIT) 0      GAD-7    Flowsheet Row Counselor from 04/28/2022 in [facility] Counselor from 02/11/2022 in [facility] Video Visit from 01/29/2021 in [facility] Clinical Support from 08/15/2020 in [facility] Clinical Support from 06/28/2020 in [facility]  Total GAD-7 Score 1 6 12 6 4       PHQ2-9    Flowsheet Row Office Visit from 04/28/2022 in [facility] Most recent reading at 04/28/2022 11:14 AM Counselor from 04/28/2022 in [facility] Most recent reading at 04/28/2022 10:20 AM Counselor from 02/11/2022 in [facility] Most recent reading at 02/11/2022 10:27 AM Office Visit from 01/03/2022 in [facility] Most recent reading at 01/03/2022  2:14 PM Office  Visit from 09/05/2021 in [facility] Most recent reading at 09/05/2021  1:41 PM  PHQ-2 Total Score 0 0 2 1 4   PHQ-9  Total Score 4 0 5 1 14       Flowsheet Row ED from 07/22/2020 in [facility] Clinical Support from 04/04/2020 in [facility]  C-SSRS RISK CATEGORY Error: Question 6 not populated No Risk        Assessment and Plan: While patient's mom has seen improvements in patient's psychosis symptoms, patient himself continues to endorse auditory hallucinations that can be bothersome.  Patient does appear to be more alert and outgoing with less repetitive behaviors.  Recommend increasing Abilify to 20 mg.  There is some concern that patient is endorsing obsessive light qualities about maintaining a certain weight.  However, patient's weight entered in the EMR is fairly stable with some noted increase, that is recommended.  Mom also endorses feeling that patient appears to be having adequate intake and she is not very concerned.  We will keep an eye on this and other symptoms of obsessive/ruminating behaviors and thoughts. Schizophrenia spectrum, unspecified type  - Increase Abilify to 20mg   Labs: CMP: WNL w/ exception BUN/Cr 8, Lipids- WNL, A1c- WNL, TSH- WNL, CBC- WNL  Collaboration of Care: Collaboration of Care:   Patient/Guardian was advised Release of Information must be obtained prior to any record release in order to collaborate their care with an outside provider. Patient/Guardian was advised if they have not already done so to contact the registration department to sign all necessary forms in order for [name] to release information regarding their care.   Consent: Patient/Guardian gives verbal consent for treatment and assignment of benefits for services provided during this visit. Patient/Guardian expressed understanding and agreed to proceed.   PGY-3 Bobbye Morton, MD 06/23/2022, 4:41 PM

## 2022-06-24 ENCOUNTER — Ambulatory Visit (HOSPITAL_COMMUNITY): Payer: Medicaid Other | Admitting: Clinical

## 2022-08-05 ENCOUNTER — Ambulatory Visit (INDEPENDENT_AMBULATORY_CARE_PROVIDER_SITE_OTHER): Payer: Medicaid Other | Admitting: Clinical

## 2022-08-05 ENCOUNTER — Encounter (HOSPITAL_COMMUNITY): Payer: Self-pay

## 2022-08-05 DIAGNOSIS — F29 Unspecified psychosis not due to a substance or known physiological condition: Secondary | ICD-10-CM | POA: Diagnosis not present

## 2022-08-07 NOTE — Progress Notes (Signed)
   THERAPIST PROGRESS NOTE  Session Time: 16 minutes  Participation Level: Minimal  Behavioral Response: CasualAlertEuthymic  Type of Therapy: Individual Therapy  Treatment Goals addressed: client will score less than a 10 on SDOH screenings  ProgressTowards Goals: Met  Interventions: CBT and Supportive  Summary:  Dustin Tate is a 27 y.o. male who presents for the scheduled appointment oriented times five, appropriately dressed and friendly. Client denied hallucinations and delusions. Client reported on today he is doing well. Client reported medication management is working well for him and he has no issues with anxiety and/or depressive symptoms. Client reported he does not have nay progress on applying and/or working for work right now. Client reported he is doing fine at home with no stressors. Client reported he has no other goals for therapy and can manage at this time with continuing his medication management appointments. Evidence of progress towards goal:  client GAD7 score is a 0.    08/07/2022    5:15 PM 04/28/2022   10:25 AM 02/11/2022   10:26 AM 01/29/2021   11:29 AM  GAD 7 : Generalized Anxiety Score  Nervous, Anxious, on Edge 0 0 1 3  Control/stop worrying 0 0 1 1  Worry too much - different things 0 0 1 1  Trouble relaxing 0 0 1 1  Restless 0 0 1 3  Easily annoyed or irritable 0 1 1 3   Afraid - awful might happen 0 0 0 0  Total GAD 7 Score 0 1 6 12   Anxiety Difficulty Not difficult at all Not difficult at all Not difficult at all Somewhat difficult    Suicidal/Homicidal: Nowithout intent/plan  Therapist Response:  Therapist began the appointment asking the client how he has been doing. Therapist used CBT to engage using active listening and positive emotional support. Therapist used CBT to engage and ask the client about medication compliance compared to any ongoing symptoms. Therapist used CBT to reinforce positive engagement in some activity along with  routine to keep himself active. Therapist completed SDOH. Therapist updated treatment plan and discuss discharge from therapy as well as address questions.    Plan: client will discharge from therapy and continue with medication management  Diagnosis: schizophrenia spectrum disorder with psychotic disorder type not yet determined  Collaboration of Care: Patient refused AEB none requested  Patient/Guardian was advised Release of Information must be obtained prior to any record release in order to collaborate their care with an outside provider. Patient/Guardian was advised if they have not already done so to contact the registration department to sign all necessary forms in order for [name] to release information regarding their care.   Consent: Patient/Guardian gives verbal consent for treatment and assignment of benefits for services provided during this visit. Patient/Guardian expressed understanding and agreed to proceed.   Neena Rhymes Dustin Batalla, LCSW 08/07/2022

## 2022-08-21 ENCOUNTER — Encounter (HOSPITAL_COMMUNITY): Payer: Self-pay | Admitting: Student

## 2022-08-21 ENCOUNTER — Ambulatory Visit (INDEPENDENT_AMBULATORY_CARE_PROVIDER_SITE_OTHER): Payer: Medicaid Other | Admitting: Student

## 2022-08-21 VITALS — BP 131/81 | HR 98 | Ht 63.0 in | Wt 115.0 lb

## 2022-08-21 DIAGNOSIS — F17200 Nicotine dependence, unspecified, uncomplicated: Secondary | ICD-10-CM

## 2022-08-21 DIAGNOSIS — F29 Unspecified psychosis not due to a substance or known physiological condition: Secondary | ICD-10-CM

## 2022-08-21 DIAGNOSIS — Z87898 Personal history of other specified conditions: Secondary | ICD-10-CM

## 2022-08-21 MED ORDER — ARIPIPRAZOLE 20 MG PO TABS: 20.0000 mg | ORAL_TABLET | Freq: Every day | ORAL | 2 refills | Status: DC

## 2022-08-21 MED ORDER — NICOTINE 21 MG/24HR TD PT24: 21.0000 mg | MEDICATED_PATCH | TRANSDERMAL | 2 refills | Status: AC

## 2022-08-21 NOTE — Progress Notes (Addendum)
BH MD/PA/NP OP Progress Note  08/21/2022 12:32 PM Dustin Tate  MRN:  [id number]  Chief Complaint:  Chief Complaint  Patient presents with   Follow-up   Medication Refill   Hallucinations   HPI: Dustin Tate is a 27 year old male seen today for follow up psychiatric evaluation. He has a psychiatric history of marijuana abuse, depression, SI/SA.  Current medication regimen: Abilify 20 mg daily   Today, patient reports feeling no difference since his last visit, which he explains: same sleep- 7 hours nightly on average, trying to eat better (trying not to force self to eat due to decreased appetite). Mom believes his sleeping and eating habits are good; she says he eats a lot throughout the day. He then agrees but notes he is "slowing down on junk food and eating more protein." His energy is intact throughout the day, but he later says he overall feels lazy which prevents him from seeking work.   He says the voices have overall improved since the last visit. They are not time of day specific. They dissipate when thinking or focused on a task. The voices sound familiar, sometimes like his best friend or cousin; they can be both male and male. They "make sure I'm ok," and bring him comfort. Non-threatening, never commands. He does sometimes feel lonely and desires to have friends. [facility], but does not plan to return because there were only older people when he went.   Patient denies feeling down, depressed, or hopeless over the past two weeks. He does report feeling lazy with lack of motivation to do things or to care for his hygiene. He reports not showering for 2-3 days at a time. Mom dispels this, noting that he showers daily.  5-6 days per week, he reports feeling on edge about eating because doesn't want to force himself to eat. He goes on to describe an aversion to food smells and textures which lead to stomach upset and removal of his desire to  eat.  In assessing for prodromal symptoms, patient denied changes to his interactions with others, including withdrawal. He was outgoing in elementary school but has been quiet-natured since middle school, when people moved away. As an adult, the last time he was social was at 89 or 4 years old when his best friend moved away. He lost contact with her. He also sold or lost his cell phone at the time and has not had one since (plans to get one soon). He talks to his 2 brothers and family members when they visit but overall can feel lonely and wants to have friends.    He denies SI, HI, VH.   Tobacco: 3-7 days. Black and mild- 2 per day. No vapes. Alcohol: Denies Marijuana 2 days per week No other drugs.   Has not needed hydroxyzine lately. Agreement with no medication changes today.     Visit Diagnosis:    ICD-10-CM   1. Unspecified psychosis not due to a substance or known physiological condition (HCC)  F29 ARIPiprazole (ABILIFY) 20 MG tablet    2. Nicotine dependence, uncomplicated, unspecified nicotine product type  F17.200 nicotine (NICODERM CQ - DOSED IN MG/24 HOURS) 21 mg/24hr patch    3. History of learning disability  Z87.898        Past Psychiatric History: Inpatient: Brief psychotic disorder-hospitalized at [facility] 07/2019 approximately 9 days today, only inpatient hospitalization Discharged on Zoloft 50 mg and Seroquel 500 mg nightly Outpatient: At  the [facility] behavioral health clinic Therapy: Patient had in-home therapy from ages 4-5 Patient had an IEP from kindergarten through 12th grade Patient was born 2 weeks premature   Last visit 03/06/2022-patient was in the car with his parents.  Patient was very distracted it was difficult to get a full assessment in patient's.  Did speak to mom there was a lot of concern that patient Seroquel 500 mg is not beneficial as he was having a lot of AVH in responding to internal stimuli.  Medication adjustments were made to titrate  patient off of Seroquel and titrated onto Abilify up to 15 mg.  04/2022- patient appears to be doing fairly well on dual Seroquel and Abilify however, as patient is on a high dose of Seroquel, would like to titrate patient off with the goal of being on an antipsychotic monotherapy due to increased risk associated with both medications. being on both medications he has had significant decrease in his auditory hallucinations and responding to internal stimuli.   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  Family Psychiatric History: Unknown   Family History:  Family History  Problem Relation Age of Onset   Healthy Mother    Healthy Father     Social History:  Social History   Socioeconomic History   Marital status: Single    Spouse name: Not on file   Number of children: Not on file   Years of education: Not on file   Highest education level: Not on file  Occupational History   Occupation: Health and safety inspector: [employer]    Comment: Studies Primary school teacher  Tobacco Use   Smoking status: Every Day    Current packs/day: 0.10    Average packs/day: 0.1 packs/day for 8.5 years (0.9 ttl pk-yrs)    Types: Cigarettes, Cigars    Start date: 2016   Smokeless tobacco: Never  Vaping Use   Vaping status: Not on file  Substance and Sexual Activity   Alcohol use: No   Drug use: Not Currently    Types: Marijuana   Sexual activity: Not Currently    Partners: Female    Birth control/protection: Condom  Other Topics Concern   Not on file  Social History Scientist, 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   Social Determinants of Health   Financial Resource Strain: Low Risk  (01/16/2020)   Overall Financial Resource Strain (CARDIA)    Difficulty of Paying Living Expenses: Not hard at all  Food Insecurity: No Food Insecurity (01/16/2020)   Hunger Vital Sign    Worried  About Running Out of Food in the Last Year: Never true    Ran Out of Food in the Last Year: Never true  Transportation Needs: No Transportation Needs (01/16/2020)   PRAPARE - Administrator, Civil Service (Medical): No    Lack of Transportation (Non-Medical): No  Physical Activity: Insufficiently Active (01/16/2020)   Exercise Vital Sign    Days of Exercise per Week: 3 days    Minutes of Exercise per Session: 30 min  Stress: Stress Concern Present (01/16/2020)   [employer] of Occupational Health - Occupational Stress Questionnaire    Feeling of Stress : To some extent  Social Connections: Socially Isolated (01/16/2020)   Social Connection and Isolation Panel [NHANES]    Frequency  of Communication with Friends and Family: Twice a week    Frequency of Social Gatherings with Friends and Family: Once a week    Attends Religious Services: Never    Database administrator or Organizations: No    Attends Engineer, structural: Never    Marital Status: Never married    Allergies: No Known Allergies  Metabolic Disorder Labs: Lab Results  Component Value Date   HGBA1C 5.5 04/28/2022   MPG 128.37 07/16/2019   Lab Results  Component Value Date   PROLACTIN 8.6 07/16/2019   Lab Results  Component Value Date   CHOL 150 04/28/2022   TRIG 51 04/28/2022   HDL 50 04/28/2022   CHOLHDL 3.0 04/28/2022   VLDL 8 07/16/2019   LDLCALC 89 04/28/2022   LDLCALC 80 06/04/2020   Lab Results  Component Value Date   TSH 1.050 04/28/2022   TSH 1.009 07/10/2019    Therapeutic Level Labs: No results found for: "LITHIUM" No results found for: "VALPROATE" No results found for: "CBMZ"  Current Medications: Current Outpatient Medications  Medication Sig Dispense Refill   nicotine (NICODERM CQ - DOSED IN MG/24 HOURS) 21 mg/24hr patch Place 1 patch (21 mg total) onto the skin daily. Remove patch after dinner. 30 patch 2   ARIPiprazole (ABILIFY) 20 MG tablet Take 1 tablet  (20 mg total) by mouth daily. 30 tablet 2   hydrOXYzine (ATARAX) 10 MG tablet Take 1 tablet by mouth three times daily as needed (Patient not taking: Reported on 08/21/2022) 90 tablet 0   No current facility-administered medications for this visit.     Musculoskeletal: Strength & Muscle Tone: within normal limits Gait & Station: normal Patient leans: N/A  Psychiatric Specialty Exam: Review of Systems  Constitutional:  Negative for activity change, appetite change and unexpected weight change.  Respiratory:  Negative for chest tightness.   Cardiovascular:  Negative for chest pain.  Neurological:  Negative for seizures and weakness.  Psychiatric/Behavioral:  Positive for hallucinations. Negative for dysphoric mood and suicidal ideas.     Blood pressure 131/81, pulse 98, height 5\' 3"  (1.6 m), weight 115 lb (52.2 kg), SpO2 100%.Body mass index is 20.37 kg/m.  General Appearance: Casual  Eye Contact:  Fair  Speech:  Clear and Coherent  Volume:  Normal  Mood:  Euthymic  Affect:  Restricted  Thought Process:  Goal Directed; Concrete  Orientation:  Full (Time, Place, and Person)  Thought Content: Hallucinations: Auditory   Suicidal Thoughts:  No  Homicidal Thoughts:  No  Memory:  Immediate;   Good Recent;   Fair  Judgement:  Fair  Insight:  Lacking  Psychomotor Activity:  Normal  Concentration:  Concentration: Fair  Recall:  NA  Fund of Knowledge: Fair  Language: Fair  Akathisia:  No  Handed:    AIMS (if indicated): not done  Assets:  Desire for Improvement Housing Resilience Social Support  ADL's:  Intact  Cognition: WNL  Sleep:  Good   Screenings: AIMS    Flowsheet Row Clinical Support from 04/25/2022 in [facility]  AIMS Total Score 0      AUDIT    Flowsheet Row [facility]  Alcohol Use Disorder Identification Test Final Score (AUDIT) 0      GAD-7     Flowsheet Row Counselor from 08/05/2022 in [facility] Counselor from 04/28/2022 in [facility] Counselor from 02/11/2022 in [facility] Video Visit from 01/29/2021 in [facility] Clinical Support from 08/15/2020 in [facility]  Total GAD-7 Score 0 1 6 12 6       PHQ2-9    Flowsheet Row Office Visit from 08/21/2022 in [facility] Most recent reading at 08/21/2022 12:32 PM Office Visit from 04/28/2022 in [facility] Most recent reading at 04/28/2022 11:14 AM Counselor from 04/28/2022 in [facility] Most recent reading at 04/28/2022 10:20 AM Counselor from 02/11/2022 in [facility] Most recent reading at 02/11/2022 10:27 AM Office Visit from 01/03/2022 in [facility] Most recent reading at 01/03/2022  2:14 PM  PHQ-2 Total Score 0 0 0 2 1  PHQ-9 Total Score -- 4 0 5 1      Flowsheet Row ED from 07/22/2020 in [facility] Clinical Support from 04/04/2020 in [facility]  C-SSRS RISK CATEGORY Error: Question 6 not populated No Risk        Assessment and Plan: Today both patient and mom note improvements in patient's psychosis symptoms, although he continues to endorse some auditory hallucinations that are comforting and not bothersome.  Patient does appear to be more alert and outgoing with less repetitive behaviors. Will continue Abilify at current dosage.  There remains some concern that patient is endorsing obsessive light qualities about maintaining a certain weight; patient's weight remains stable. Mom also continues to endorse feeling that patient appears to be having adequate intake, and she is not very concerned.    I have low suspicion of a schizophrenia spectrum disorder but  some suspicion for autism spectrum disorder due to concrete thought process and limited social interactions. As well, he did not appear to have prodromal symptoms. Rather, the shift in his social life appeared similar to that of his childhood, withdrawing as people with whom he was once friends moved away. The hallucinations appear to be more consistent with comfort and companionship typical of someone with ASD and/or intellectual disability (patient does have a documented learning disability). Will continue to explore in subsequent visits.   Discussed Black and Mild and marijuana use with patient, advising him to pay attention to whether the breathing pattern of smoking or the content of the cigars themselves help to relax him more for the former, and to whether the marijuana use worsens hallucinations.  Labs: CMP: WNL w/ exception BUN/Cr 8, Lipids- WNL, A1c- WNL, TSH- WNL, CBC- WNL  Psychosis, unspecified  History of learning disability  Consider IDD and ASD - Continue Abilify 20mg   Nicotine Use Disorder - START Nicotine 21 mg patches    Collaboration of Care: Collaboration of Care:   Patient/Guardian was advised Release of Information must be obtained prior to any record release in order to collaborate their care with an outside provider. Patient/Guardian was advised if they have not already done so to contact the registration department to sign all necessary forms in order for [name] to release information regarding their care.   Consent: Patient/Guardian gives verbal consent for treatment and assignment of benefits for services provided during this visit. Patient/Guardian expressed understanding and agreed to proceed.   PGY-3 Lamar Sprinkles, MD 08/21/2022, 12:32 PM

## 2022-08-22 NOTE — Addendum Note (Signed)
Addended by: Theodoro Kos A on: 08/22/2022 01:03 PM   Modules accepted: Level of Service

## 2022-10-03 ENCOUNTER — Ambulatory Visit (INDEPENDENT_AMBULATORY_CARE_PROVIDER_SITE_OTHER): Payer: Medicare Other | Admitting: Student

## 2022-10-03 ENCOUNTER — Encounter (HOSPITAL_COMMUNITY): Payer: Self-pay | Admitting: Student

## 2022-10-03 VITALS — BP 128/79 | HR 100 | Ht 63.0 in | Wt 111.4 lb

## 2022-10-03 DIAGNOSIS — F17200 Nicotine dependence, unspecified, uncomplicated: Secondary | ICD-10-CM | POA: Diagnosis not present

## 2022-10-03 DIAGNOSIS — Z87898 Personal history of other specified conditions: Secondary | ICD-10-CM | POA: Diagnosis not present

## 2022-10-03 DIAGNOSIS — F29 Unspecified psychosis not due to a substance or known physiological condition: Secondary | ICD-10-CM

## 2022-10-03 NOTE — Progress Notes (Unsigned)
BH MD/PA/NP OP Progress Note  10/03/2022 11:05 AM DMANI ECKERD  MRN:  [id number]  Chief Complaint:  No chief complaint on file.  HPI: Fredrich Heisser is a 27 year old male seen today for follow up psychiatric evaluation. He has a psychiatric history of marijuana abuse, depression, SI/SA.  Current medication regimen: Abilify 20 mg daily   Patient reports things have been "okay" since last visit. Sleeping well. Mood is lower for 4 days, then normalized. Appetite "getting there." Reports eating 3 meals per day and walking for exercise. Denies changes to eating habits. Drinks water and leaves sodas alone.   Denies dizziness, lightheadedness, weakness, fatigue, N/V (vomited x 1 in July; stomach bug). Denies constipation, diarrhea.   Going with mom to [place], [place], shopping, going to restaurants, and parks with mom. Enjoying doing so.   Denies problems with Abilify.   Denies AH, since July. Denies VH. Denies SI, HI.   Black and Mild use: 2 per day. Did complete breathing exercises, helped with headaches, and nerves. Denies alcohol, marijuana and other illicit drug use.   IEP throughout entire schooling history. Worked on Chemical engineer, focus due to easy distractibility, reading due to improper pronunciations or pronounced in the reverse order, writing larger. Small groups and individual learning. Dx: wihtin folder but mom cannot recall exact. Learning disability.  Mom still has IEP folder.   Mom reports things going well with eating 3x daily, sleeping, involvement around the home, on the computer, desiring to go out more.    Worked on Radiation protection practitioner previously. Maybe   Goal: Finding a home for himself, driving, staying employed.     Visit Diagnosis:  No diagnosis found.    Past Psychiatric History: Inpatient: Brief psychotic disorder-hospitalized at [facility] 07/2019 approximately 9 days today, only inpatient hospitalization Discharged on Zoloft 50 mg and Seroquel 500  mg nightly Outpatient: At the [facility] behavioral health clinic Therapy: Patient had in-home therapy from ages [age] Patient had an IEP from kindergarten through 12th grade Patient was born 2 weeks premature   Last visit 03/06/2022-patient was in the car with his parents.  Patient was very distracted it was difficult to get a full assessment in patient's.  Did speak to mom there was a lot of concern that patient Seroquel 500 mg is not beneficial as he was having a lot of AVH in responding to internal stimuli.  Medication adjustments were made to titrate patient off of Seroquel and titrated onto Abilify up to 15 mg.  04/2022- patient appears to be doing fairly well on dual Seroquel and Abilify however, as patient is on a high dose of Seroquel, would like to titrate patient off with the goal of being on an antipsychotic monotherapy due to increased risk associated with both medications. being on both medications he has had significant decrease in his auditory hallucinations and responding to internal stimuli.   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  Family Psychiatric History: Unknown   Family History:  Family History  Problem Relation Age of Onset   Healthy Mother    Healthy Father     Social History:  Social History   Socioeconomic History   Marital status: Single    Spouse name: Not on file   Number of children: Not on file   Years of education: Not on  file   Highest education level: Not on file  Occupational History   Occupation: Health and safety inspector: [employer]    Comment: Studies Primary school teacher  Tobacco Use   Smoking status: Every Day    Current packs/day: 0.10    Average packs/day: 0.1 packs/day for 8.7 years (0.9 ttl pk-yrs)    Types: Cigarettes, Cigars    Start date: 2016   Smokeless tobacco: Never  Vaping Use   Vaping status: Not on file   Substance and Sexual Activity   Alcohol use: No   Drug use: Not Currently    Types: Marijuana   Sexual activity: Not Currently    Partners: Female    Birth control/protection: Condom  Other Topics Concern   Not on file  Social History Scientist, 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   Social Determinants of Health   Financial Resource Strain: Low Risk  (01/16/2020)   Overall Financial Resource Strain (CARDIA)    Difficulty of Paying Living Expenses: Not hard at all  Food Insecurity: No Food Insecurity (01/16/2020)   Hunger Vital Sign    Worried About Running Out of Food in the Last Year: Never true    Ran Out of Food in the Last Year: Never true  Transportation Needs: No Transportation Needs (01/16/2020)   PRAPARE - Administrator, Civil Service (Medical): No    Lack of Transportation (Non-Medical): No  Physical Activity: Insufficiently Active (01/16/2020)   Exercise Vital Sign    Days of Exercise per Week: 3 days    Minutes of Exercise per Session: 30 min  Stress: Stress Concern Present (01/16/2020)   [employer] of Occupational Health - Occupational Stress Questionnaire    Feeling of Stress : To some extent  Social Connections: Socially Isolated (01/16/2020)   Social Connection and Isolation Panel [NHANES]    Frequency of Communication with Friends and Family: Twice a week    Frequency of Social Gatherings with Friends and Family: Once a week    Attends Religious Services: Never    Database administrator or Organizations: No    Attends Engineer, structural: Never    Marital Status: Never married    Allergies: No Known Allergies  Metabolic Disorder Labs: Lab Results  Component Value Date   HGBA1C 5.5 04/28/2022   MPG 128.37 07/16/2019   Lab Results  Component Value Date   PROLACTIN 8.6 07/16/2019   Lab Results  Component Value Date   CHOL 150 04/28/2022   TRIG 51 04/28/2022   HDL 50 04/28/2022   CHOLHDL 3.0 04/28/2022   VLDL  8 07/16/2019   LDLCALC 89 04/28/2022   LDLCALC 80 06/04/2020   Lab Results  Component Value Date   TSH 1.050 04/28/2022   TSH 1.009 07/10/2019    Therapeutic Level Labs: No results found for: "LITHIUM" No results found for: "VALPROATE" No results found for: "CBMZ"  Current Medications: Current Outpatient Medications  Medication Sig Dispense Refill   ARIPiprazole (ABILIFY) 20 MG tablet Take 1 tablet (20 mg total) by mouth daily. 30 tablet 2   hydrOXYzine (ATARAX) 10 MG tablet Take 1 tablet by mouth three times daily as needed (Patient not taking: Reported on 08/21/2022) 90 tablet 0   nicotine (NICODERM CQ - DOSED IN MG/24 HOURS) 21 mg/24hr patch Place 1 patch (21 mg total) onto the skin daily. Remove patch after dinner. 30 patch 2   No current facility-administered medications for this visit.  Musculoskeletal: Strength & Muscle Tone: within normal limits Gait & Station: normal Patient leans: N/A  Psychiatric Specialty Exam: Review of Systems  Constitutional:  Negative for activity change, appetite change and unexpected weight change.  Respiratory:  Negative for chest tightness.   Cardiovascular:  Negative for chest pain.  Neurological:  Negative for seizures and weakness.  Psychiatric/Behavioral:  Positive for hallucinations. Negative for dysphoric mood and suicidal ideas.     There were no vitals taken for this visit.There is no height or weight on file to calculate BMI.  General Appearance: Casual  Eye Contact:  Fair  Speech:  Clear and Coherent  Volume:  Normal  Mood:  Euthymic  Affect:  Restricted  Thought Process:  Goal Directed; Concrete  Orientation:  Full (Time, Place, and Person)  Thought Content: Hallucinations: Auditory   Suicidal Thoughts:  No  Homicidal Thoughts:  No  Memory:  Immediate;   Good Recent;   Fair  Judgement:  Fair  Insight:  Lacking  Psychomotor Activity:  Normal  Concentration:  Concentration: Fair  Recall:  NA  Fund of Knowledge:  Fair  Language: Fair  Akathisia:  No  Handed:    AIMS (if indicated): not done  Assets:  Desire for Improvement Housing Resilience Social Support  ADL's:  Intact  Cognition: WNL  Sleep:  Good   Screenings: AIMS    Flowsheet Row Clinical Support from 04/25/2022 in [facility]  AIMS Total Score 0      AUDIT    Flowsheet Row [facility]  Alcohol Use Disorder Identification Test Final Score (AUDIT) 0      GAD-7    Flowsheet Row Counselor from 08/05/2022 in [facility] Counselor from 04/28/2022 in [facility] Counselor from 02/11/2022 in [facility] Video Visit from 01/29/2021 in [facility] Clinical Support from 08/15/2020 in [facility]  Total GAD-7 Score 0 1 6 12 6       PHQ2-9    Flowsheet Row Office Visit from 08/21/2022 in [facility] Most recent reading at 08/21/2022 12:32 PM Office Visit from 04/28/2022 in [facility] Most recent reading at 04/28/2022 11:14 AM Counselor from 04/28/2022 in [facility] Most recent reading at 04/28/2022 10:20 AM Counselor from 02/11/2022 in [facility] Most recent reading at 02/11/2022 10:27 AM Office Visit from 01/03/2022 in [facility] Most recent reading at 01/03/2022  2:14 PM  PHQ-2 Total Score 0 0 0 2 1  PHQ-9 Total Score -- 4 0 5 1      Flowsheet Row ED from 07/22/2020 in [facility] Clinical Support from 04/04/2020 in [facility]  C-SSRS RISK CATEGORY Error: Question 6 not populated No Risk        Assessment and Plan: Today both patient and mom note improvements in patient's psychosis symptoms, although he continues  to endorse some auditory hallucinations that are comforting and not bothersome.  Patient does appear to be more alert and outgoing with less repetitive behaviors. Will continue Abilify at current dosage.  There remains some concern that patient is endorsing obsessive light qualities about maintaining a certain weight; patient's weight remains stable. Mom also continues to endorse feeling that patient appears to be having adequate intake, and she is not very concerned.  I have low suspicion of a schizophrenia spectrum disorder but some suspicion for autism spectrum disorder due to concrete thought process and limited social interactions. As well, he did not appear to have prodromal symptoms. Rather, the shift in his social life appeared similar to that of his childhood, withdrawing as people with whom he was once friends moved away. The hallucinations appear to be more consistent with comfort and companionship typical of someone with ASD and/or intellectual disability (patient does have a documented learning disability). Will continue to explore in subsequent visits.   Discussed Black and Mild and marijuana use with patient, advising him to pay attention to whether the breathing pattern of smoking or the content of the cigars themselves help to relax him more for the former, and to whether the marijuana use worsens hallucinations.  Labs: CMP: WNL w/ exception BUN/Cr 8, Lipids- WNL, A1c- WNL, TSH- WNL, CBC- WNL  Psychosis, unspecified  History of learning disability  Consider IDD and ASD - Continue Abilify 20mg   Nicotine Use Disorder - START Nicotine 21 mg patches    Collaboration of Care: Collaboration of Care:   Patient/Guardian was advised Release of Information must be obtained prior to any record release in order to collaborate their care with an outside provider. Patient/Guardian was advised if they have not already done so to contact the registration department to sign all necessary forms  in order for [name] to release information regarding their care.   Consent: Patient/Guardian gives verbal consent for treatment and assignment of benefits for services provided during this visit. Patient/Guardian expressed understanding and agreed to proceed.   PGY-3 Lamar Sprinkles, MD 10/03/2022, 11:05 AM

## 2022-10-06 MED ORDER — ARIPIPRAZOLE 20 MG PO TABS: 20.0000 mg | ORAL_TABLET | Freq: Every day | ORAL | 2 refills | Status: DC

## 2022-11-03 ENCOUNTER — Telehealth (HOSPITAL_COMMUNITY): Payer: Self-pay | Admitting: Clinical

## 2022-11-03 ENCOUNTER — Ambulatory Visit (HOSPITAL_COMMUNITY): Payer: Medicare Other | Admitting: Clinical

## 2022-11-03 DIAGNOSIS — F29 Unspecified psychosis not due to a substance or known physiological condition: Secondary | ICD-10-CM

## 2022-11-03 NOTE — Progress Notes (Signed)
REO PORTELA is a 27 y.o. male patient presented for the scheduled therapy appointment. Therapist discussed the reason for the clients reason for presentation. Client reported he believes the psychiatrist made him this therapy appointment on today. Client reported he is stable on medication. Client reported he has no depression, anxiety and/or AVH. Client denied S.I and H.I. Client reported he has no identifiable goals for therapy and does see it as necessary right now. Client reported no crisis needs.    Collaboration of Care: Other client requested a AVS. Therapist obtained from admin staff via hard copy.  Patient/Guardian was advised Release of Information must be obtained prior to any record release in order to collaborate their care with an outside provider. Patient/Guardian was advised if they have not already done so to contact the registration department to sign all necessary forms in order for [name] to release information regarding their care.   Consent: Patient/Guardian gives verbal consent for treatment and assignment of benefits for services provided during this visit. Patient/Guardian expressed understanding and agreed to proceed.    Neena Rhymes Christain Niznik, LCSW

## 2022-11-03 NOTE — Telephone Encounter (Signed)
PT gave me verbal permission over the phone to release an AVS on 11/03/22 to his mother who came by the office to pick it up.

## 2022-11-11 ENCOUNTER — Ambulatory Visit: Payer: Medicaid Other

## 2022-11-13 ENCOUNTER — Ambulatory Visit: Payer: Medicare Other

## 2022-11-13 DIAGNOSIS — Z23 Encounter for immunization: Secondary | ICD-10-CM | POA: Diagnosis present

## 2022-11-13 NOTE — Progress Notes (Signed)
Patient presents to nurse clinic for Flu vaccine.  Vaccine administered without complication.  See admin for details.

## 2022-11-21 ENCOUNTER — Ambulatory Visit (HOSPITAL_COMMUNITY): Payer: Medicare Other | Admitting: Student

## 2022-11-21 ENCOUNTER — Encounter (HOSPITAL_COMMUNITY): Payer: Self-pay | Admitting: Student

## 2022-11-21 VITALS — BP 139/83 | HR 88 | Ht 63.0 in | Wt 123.8 lb

## 2022-11-21 DIAGNOSIS — F29 Unspecified psychosis not due to a substance or known physiological condition: Secondary | ICD-10-CM

## 2022-11-21 DIAGNOSIS — F17200 Nicotine dependence, unspecified, uncomplicated: Secondary | ICD-10-CM

## 2022-11-21 DIAGNOSIS — F3341 Major depressive disorder, recurrent, in partial remission: Secondary | ICD-10-CM

## 2022-11-21 DIAGNOSIS — Z87898 Personal history of other specified conditions: Secondary | ICD-10-CM

## 2022-11-21 NOTE — Progress Notes (Signed)
BH MD/PA/NP OP Progress Note  11/21/2022 8:50 PM  Dustin Tate  MRN:  [id number]  Chief Complaint:  Chief Complaint  Patient presents with   Follow-up   Medication Refill   HPI: Dustin Tate is a 27 year old male seen today for follow up psychiatric evaluation. He has a psychiatric history of marijuana abuse, depression, SI/SA.  Current medication regimen: Abilify 20 mg daily   Patient reports things are going well. Voices remain quiet. Mood is alright. Good appetite, 3 meals daily and snacking. Walking for exercise.   Denies dizziness, lightheadedness, weakness, fatigue, N/V, akathisia. Denies constipation, diarrhea.   For fun: listening to music, plays pool on phone. Denies VH, SI, HI.   Not working on any projects, toward employment nor driving. Not engaging in social media, although he used to in the past.    Black and Mild use: Average of 1 per day. Decreased on his own. He was commended for this and appreciative. Denies alcohol, marijuana and other illicit drug use.   Mom reports things going well with eating 3x daily, sleeping, involvement around the home, on the computer and his cell phone, desiring to go out more. He is talking to brother, dad, and other family members on the phone regularly.       Visit Diagnosis:    ICD-10-CM   1. History of learning disability  Z87.898     2. Unspecified psychosis not due to a substance or known physiological condition (HCC)  F29     3. Nicotine dependence, uncomplicated, unspecified nicotine product type  F17.200     4. Recurrent major depressive disorder, in partial remission (HCC)  F33.41          Past Psychiatric History: Inpatient: Brief psychotic disorder-hospitalized at [facility] H 07/2019 approximately 9 days today, only inpatient hospitalization Discharged on Zoloft 50 mg and Seroquel 500 mg nightly Outpatient: At the [facility] behavioral health clinic Therapy: Patient had in-home therapy from ages  [age] Patient had an IEP from kindergarten through 12th grade Patient was born 2 weeks premature   Last visit 03/06/2022-patient was in the car with his parents.  Patient was very distracted it was difficult to get a full assessment in patient's.  Did speak to mom there was a lot of concern that patient Seroquel 500 mg is not beneficial as he was having a lot of AVH in responding to internal stimuli.  Medication adjustments were made to titrate patient off of Seroquel and titrated onto Abilify up to 15 mg.  04/2022- patient appears to be doing fairly well on dual Seroquel and Abilify however, as patient is on a high dose of Seroquel, would like to titrate patient off with the goal of being on an antipsychotic monotherapy due to increased risk associated with both medications. being on both medications he has had significant decrease in his auditory hallucinations and responding to internal stimuli.   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  Family Psychiatric History: Unknown   Family History:  Family History  Problem Relation Age of Onset   Healthy Mother    Healthy Father     Social History:  Social History   Socioeconomic History   Marital status: Single    Spouse name: Not on file   Number of children: Not on file   Years of  education: Not on file   Highest education level: Not on file  Occupational History   Occupation: Health and safety inspector: [employer]    Comment: Studies Primary school teacher  Tobacco Use   Smoking status: Every Day    Current packs/day: 0.10    Average packs/day: 0.1 packs/day for 8.8 years (0.9 ttl pk-yrs)    Types: Cigarettes, Cigars    Start date: 2016   Smokeless tobacco: Never  Vaping Use   Vaping status: Not on file  Substance and Sexual Activity   Alcohol use: No   Drug use: Not Currently    Types: Marijuana   Sexual activity: Not  Currently    Partners: Female    Birth control/protection: Condom  Other Topics Concern   Not on file  Social History Scientist, 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   Social Determinants of Health   Financial Resource Strain: Low Risk  (01/16/2020)   Overall Financial Resource Strain (CARDIA)    Difficulty of Paying Living Expenses: Not hard at all  Food Insecurity: No Food Insecurity (01/16/2020)   Hunger Vital Sign    Worried About Running Out of Food in the Last Year: Never true    Ran Out of Food in the Last Year: Never true  Transportation Needs: No Transportation Needs (01/16/2020)   PRAPARE - Administrator, Civil Service (Medical): No    Lack of Transportation (Non-Medical): No  Physical Activity: Insufficiently Active (01/16/2020)   Exercise Vital Sign    Days of Exercise per Week: 3 days    Minutes of Exercise per Session: 30 min  Stress: Stress Concern Present (01/16/2020)   [employer] of Occupational Health - Occupational Stress Questionnaire    Feeling of Stress : To some extent  Social Connections: Socially Isolated (01/16/2020)   Social Connection and Isolation Panel [NHANES]    Frequency of Communication with Friends and Family: Twice a week    Frequency of Social Gatherings with Friends and Family: Once a week    Attends Religious Services: Never    Database administrator or Organizations: No    Attends Engineer, structural: Never    Marital Status: Never married    Allergies: No Known Allergies  Metabolic Disorder Labs: Lab Results  Component Value Date   HGBA1C 5.5 04/28/2022   MPG 128.37 07/16/2019   Lab Results  Component Value Date   PROLACTIN 8.6 07/16/2019   Lab Results  Component Value Date   CHOL 150 04/28/2022   TRIG 51 04/28/2022   HDL 50 04/28/2022   CHOLHDL 3.0 04/28/2022   VLDL 8 07/16/2019   LDLCALC 89 04/28/2022   LDLCALC 80 06/04/2020   Lab Results  Component Value Date   TSH 1.050  04/28/2022   TSH 1.009 07/10/2019    Therapeutic Level Labs: No results found for: "LITHIUM" No results found for: "VALPROATE" No results found for: "CBMZ"  Current Medications: Current Outpatient Medications  Medication Sig Dispense Refill   ARIPiprazole (ABILIFY) 20 MG tablet Take 1 tablet (20 mg total) by mouth daily. 30 tablet 2   No current facility-administered medications for this visit.     Musculoskeletal: Strength & Muscle Tone: within normal limits Gait & Station: normal Patient leans: N/A  Psychiatric Specialty Exam: Review of Systems  Constitutional:  Negative for activity change, appetite change and unexpected weight change.  Respiratory:  Negative for chest tightness.   Cardiovascular:  Negative for chest pain.  Neurological:  Negative for seizures and weakness.  Psychiatric/Behavioral:  Negative for dysphoric mood, hallucinations and suicidal ideas.     Blood pressure 139/83, pulse 88, height 5\' 3"  (1.6 m), weight 123 lb 12.8 oz (56.2 kg), SpO2 100%.Body mass index is 21.93 kg/m.  General Appearance: Casual  Eye Contact:  Fair  Speech:  Clear and Coherent  Volume:  Normal  Mood:  Euthymic  Affect:  Restricted  Thought Process:  Goal Directed; Concrete  Orientation:  Full (Time, Place, and Person)  Thought Content: WDL   Suicidal Thoughts:  No  Homicidal Thoughts:  No  Memory:  Immediate;   Good Recent;   Fair  Judgement:  Fair  Insight:  Lacking  Psychomotor Activity:  Normal  Concentration:  Concentration: Fair  Recall:  NA  Fund of Knowledge: Fair  Language: Fair  Akathisia:  No  Handed:    AIMS (if indicated): not done  Assets:  Desire for Improvement Housing Resilience Social Support  ADL's:  Intact  Cognition: WNL  Sleep:  Good   Screenings: AIMS    Flowsheet Row Clinical [facility]/25/2022 in [facility]  AIMS Total Score 0      AUDIT    Flowsheet Row [facility]  Alcohol Use Disorder Identification Test Final Score (AUDIT) 0      GAD-7    Flowsheet Row Counselor from 08/05/2022 in [facility] Counselor from 04/28/2022 in [facility] Counselor from 02/11/2022 in [facility] Video Visit from 01/29/2021 in [facility] Clinical Support from 08/15/2020 in [facility]  Total GAD-7 Score 0 1 6 12 6       PHQ2-9    Flowsheet Row Office Visit from 08/21/2022 in [facility] Most recent reading at 08/21/2022 12:32 PM Office Visit from 04/28/2022 in [facility] Most recent reading at 04/28/2022 11:14 AM Counselor from 04/28/2022 in [facility] Most recent reading at 04/28/2022 10:20 AM Counselor from 02/11/2022 in [facility] Most recent reading at 02/11/2022 10:27 AM Office Visit from 01/03/2022 in [facility] Most recent reading at 01/03/2022  2:14 PM  PHQ-2 Total Score 0 0 0 2 1  PHQ-9 Total Score -- 4 0 5 1      Flowsheet Row ED from 07/22/2020 in [facility] Clinical Support from 04/04/2020 in [facility]  C-SSRS RISK CATEGORY Error: Question 6 not populated No Risk        Assessment and Plan: Today both patient and mom note sustained improvements in patient's overall functioning. He again denies AH since the last visit. He denies AE of current medication and reports tolerating well without issue. Will continue Abilify at current dosage.  Patient's weight has increased by 12 lb since last visit, and he and mom report that his appetite has increased.   From previous visit: As patient becomes more involved in enjoyable activities with his mom and around others, has not experienced AVH or other  psychotic sx since last visit, and shows no signs of cognitive decline vs baseline deficits, continue to have limited concern for a psychotic spectrum. Concerns primarily for an autism spectrum disorder.  Mom forgot to bring IEP this visit, but will bring it to his next visit or bring to the clinic ahead  of visit.   Commended patient on decreased Black and Mild use and marijuana cessation since last visit.   Psychosis, unspecified, resolved  History of learning disability  Consider IDD and ASD - Continue Abilify 20mg  - Mom to bring patient's IEP to next appointment - Continue therapy with Paige.  Nicotine Use Disorder, improved - As patient has decreased use on his own, held off on discussing Chantix    Collaboration of Care: Collaboration of Care: Dr. Lucianne Muss  Patient/Guardian was advised Release of Information must be obtained prior to any record release in order to collaborate their care with an outside provider. Patient/Guardian was advised if they have not already done so to contact the registration department to sign all necessary forms in order for [name] to release information regarding their care.   Consent: Patient/Guardian gives verbal consent for treatment and assignment of benefits for services provided during this visit. Patient/Guardian expressed understanding and agreed to proceed.   PGY-3 Lamar Sprinkles, MD 11/21/2022 8:50 PM

## 2022-12-16 ENCOUNTER — Ambulatory Visit (HOSPITAL_COMMUNITY): Payer: Medicare Other | Admitting: Clinical

## 2023-02-11 ENCOUNTER — Other Ambulatory Visit (HOSPITAL_COMMUNITY): Payer: Medicare Other

## 2023-02-11 ENCOUNTER — Other Ambulatory Visit (HOSPITAL_COMMUNITY): Payer: Self-pay | Admitting: Student

## 2023-02-11 DIAGNOSIS — Z79899 Other long term (current) drug therapy: Secondary | ICD-10-CM

## 2023-02-11 DIAGNOSIS — Z0000 Encounter for general adult medical examination without abnormal findings: Secondary | ICD-10-CM

## 2023-02-11 NOTE — Progress Notes (Signed)
 Patient tolerated labs well and will follow up with provider.

## 2023-02-12 ENCOUNTER — Other Ambulatory Visit (HOSPITAL_COMMUNITY): Payer: Self-pay | Admitting: Psychiatry

## 2023-02-13 LAB — COMPREHENSIVE METABOLIC PANEL
ALT: 23 [IU]/L (ref 0–44)
AST: 16 [IU]/L (ref 0–40)
Albumin: 5.1 g/dL (ref 4.3–5.2)
Alkaline Phosphatase: 61 [IU]/L (ref 44–121)
BUN/Creatinine Ratio: 11 (ref 9–20)
BUN: 12 mg/dL (ref 6–20)
Bilirubin Total: 0.4 mg/dL (ref 0.0–1.2)
CO2: 20 mmol/L (ref 20–29)
Calcium: 9.6 mg/dL (ref 8.7–10.2)
Chloride: 102 mmol/L (ref 96–106)
Creatinine, Ser: 1.06 mg/dL (ref 0.76–1.27)
Globulin, Total: 2.2 g/dL (ref 1.5–4.5)
Glucose: 81 mg/dL (ref 70–99)
Potassium: 5.3 mmol/L — ABNORMAL HIGH (ref 3.5–5.2)
Sodium: 140 mmol/L (ref 134–144)
Total Protein: 7.3 g/dL (ref 6.0–8.5)
eGFR: 99 mL/min/{1.73_m2} (ref >59)

## 2023-02-13 LAB — LIPID PANEL
Chol/HDL Ratio: 4.3 {ratio} (ref 0.0–5.0)
Cholesterol, Total: 182 mg/dL (ref 100–199)
HDL: 42 mg/dL (ref >39)
LDL Chol Calc (NIH): 126 mg/dL — ABNORMAL HIGH (ref 0–99)
Triglycerides: 73 mg/dL (ref 0–149)
VLDL Cholesterol Cal: 14 mg/dL (ref 5–40)

## 2023-02-13 LAB — CBC WITH DIFFERENTIAL/PLATELET
Basophils Absolute: 0 10*3/uL (ref 0.0–0.2)
Basos: 1 %
EOS (ABSOLUTE): 0 10*3/uL (ref 0.0–0.4)
Eos: 0 %
Hematocrit: 47.7 % (ref 37.5–51.0)
Hemoglobin: 15.4 g/dL (ref 13.0–17.7)
Immature Grans (Abs): 0 10*3/uL (ref 0.0–0.1)
Immature Granulocytes: 0 %
Lymphocytes Absolute: 1.1 10*3/uL (ref 0.7–3.1)
Lymphs: 23 %
MCH: 25.8 pg — ABNORMAL LOW (ref 26.6–33.0)
MCHC: 32.3 g/dL (ref 31.5–35.7)
MCV: 80 fL (ref 79–97)
Monocytes Absolute: 0.3 10*3/uL (ref 0.1–0.9)
Monocytes: 7 %
Neutrophils Absolute: 3.3 10*3/uL (ref 1.4–7.0)
Neutrophils: 69 %
Platelets: 311 10*3/uL (ref 150–450)
RBC: 5.97 x10E6/uL — ABNORMAL HIGH (ref 4.14–5.80)
RDW: 14.4 % (ref 11.6–15.4)
WBC: 4.8 10*3/uL (ref 3.4–10.8)

## 2023-02-13 LAB — HEMOGLOBIN A1C
Est. average glucose Bld gHb Est-mCnc: 114 mg/dL
Hgb A1c MFr Bld: 5.6 % (ref 4.8–5.6)

## 2023-02-13 LAB — SPECIMEN STATUS REPORT

## 2023-02-13 LAB — TSH: TSH: 1.09 u[IU]/mL (ref 0.450–4.500)

## 2023-02-20 ENCOUNTER — Encounter (HOSPITAL_COMMUNITY): Payer: Medicare Other | Admitting: Student

## 2023-02-27 ENCOUNTER — Encounter (HOSPITAL_COMMUNITY): Payer: Self-pay | Admitting: Student

## 2023-02-27 ENCOUNTER — Ambulatory Visit (INDEPENDENT_AMBULATORY_CARE_PROVIDER_SITE_OTHER): Payer: Medicare Other | Admitting: Student

## 2023-02-27 VITALS — BP 121/75 | HR 68 | Ht 63.0 in | Wt 120.0 lb

## 2023-02-27 DIAGNOSIS — F17200 Nicotine dependence, unspecified, uncomplicated: Secondary | ICD-10-CM

## 2023-02-27 DIAGNOSIS — F29 Unspecified psychosis not due to a substance or known physiological condition: Secondary | ICD-10-CM | POA: Diagnosis not present

## 2023-02-27 DIAGNOSIS — Z87898 Personal history of other specified conditions: Secondary | ICD-10-CM | POA: Diagnosis not present

## 2023-02-27 MED ORDER — ARIPIPRAZOLE 30 MG PO TABS: 30.0000 mg | ORAL_TABLET | Freq: Every day | ORAL | 1 refills | Status: DC

## 2023-02-27 NOTE — Progress Notes (Signed)
BH MD/PA/NP OP Progress Note  02/27/2023 12:11 PM  COADY TRAIN  MRN:  [id number]  Chief Complaint:  Chief Complaint  Patient presents with   Follow-up   Hallucinations   HPI: Dustin Tate is a 28 year old male seen today for follow up psychiatric evaluation. He has a psychiatric history of marijuana abuse, depression, SI/SA.  Current medication regimen: Abilify 20 mg daily   Patient reports things are going well. Gaming for fun. Had holiday trip to [place], enjoyed himself. Mood is stable.   No longer on same routine. Waking up around 10 AM. Sometimes feels well rested. Going to bed around 1 AM. Stays up playing video games and watching TV.   Appetite good.   Mom reports that he has been hearing voices again. Patient states 4 days out of week. Hears voices, male and male. Command hallucinations. "Cannot say." Last time yesterday. Denies VH. Returned soon after 10/18 visit. He was more energetic prior to return.  Mom noticed voices return of voices after holidays. He was responding to internal stimuli. He talks to the voices after losing games. Appetite is variable. No changes to his mood noted. She is unsure if he is actually taking his medications. She gives it to him daily. The voices   Patient reports that he "stopped taking" medications due to sedation shortly after last visit. Voices did not tell him to discontinue. Voices returned prior to discontinuing medication.   Black and Mild use: Average of 1 per day. Decreased on his own. He was commended for this and appreciative. Denies alcohol, marijuana and other illicit drug use. Denies caffeine consumption.    Visit Diagnosis:    ICD-10-CM   1. Unspecified psychosis not due to a substance or known physiological condition (HCC)  F29 ARIPiprazole (ABILIFY) 30 MG tablet    2. History of learning disability  Z87.898     3. Nicotine dependence, uncomplicated, unspecified nicotine product type  F17.200            Past Psychiatric History: Inpatient: Brief psychotic disorder-hospitalized at [facility] 07/2019 approximately 9 days today, only inpatient hospitalization Discharged on Zoloft 50 mg and Seroquel 500 mg nightly Outpatient: At the [facility] behavioral health clinic Therapy: Patient had in-home therapy from ages [age] Patient had an IEP from kindergarten through 12th grade Patient was born 2 weeks premature   Last visit 03/06/2022-patient was in the car with his parents.  Patient was very distracted it was difficult to get a full assessment in patient's.  Did speak to mom there was a lot of concern that patient Seroquel 500 mg is not beneficial as he was having a lot of AVH in responding to internal stimuli.  Medication adjustments were made to titrate patient off of Seroquel and titrated onto Abilify up to 15 mg.  04/2022- patient appears to be doing fairly well on dual Seroquel and Abilify however, as patient is on a high dose of Seroquel, would like to titrate patient off with the goal of being on an antipsychotic monotherapy due to increased risk associated with both medications. being on both medications he has had significant decrease in his auditory hallucinations and responding to internal stimuli.   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  Family Psychiatric History: Unknown   Family History:  Family History  Problem Relation  Age of Onset   Healthy Mother    Healthy Father     Social History:  Social History   Socioeconomic History   Marital status: Single    Spouse name: Not on file   Number of children: Not on file   Years of education: Not on file   Highest education level: Not on file  Occupational History   Occupation: Health and safety inspector: [employer]    Comment: Studies Primary school teacher  Tobacco Use   Smoking status: Every Day    Current  packs/day: 0.10    Average packs/day: 0.1 packs/day for 9.1 years (0.9 ttl pk-yrs)    Types: Cigarettes, Cigars    Start date: 2016   Smokeless tobacco: Never  Vaping Use   Vaping status: Not on file  Substance and Sexual Activity   Alcohol use: No   Drug use: Not Currently    Types: Marijuana   Sexual activity: Not Currently    Partners: Female    Birth control/protection: Condom  Other Topics Concern   Not on file  Social History Scientist, 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   Social Drivers of Health   Financial Resource Strain: Low Risk  (01/16/2020)   Overall Financial Resource Strain (CARDIA)    Difficulty of Paying Living Expenses: Not hard at all  Food Insecurity: No Food Insecurity (01/16/2020)   Hunger Vital Sign    Worried About Running Out of Food in the Last Year: Never true    Ran Out of Food in the Last Year: Never true  Transportation Needs: No Transportation Needs (01/16/2020)   PRAPARE - Administrator, Civil Service (Medical): No    Lack of Transportation (Non-Medical): No  Physical Activity: Insufficiently Active (01/16/2020)   Exercise Vital Sign    Days of Exercise per Week: 3 days    Minutes of Exercise per Session: 30 min  Stress: Stress Concern Present (01/16/2020)   [employer] of Occupational Health - Occupational Stress Questionnaire    Feeling of Stress : To some extent  Social Connections: Socially Isolated (01/16/2020)   Social Connection and Isolation Panel [NHANES]    Frequency of Communication with Friends and Family: Twice a week    Frequency of Social Gatherings with Friends and Family: Once a week    Attends Religious Services: Never    Database administrator or Organizations: No    Attends Engineer, structural: Never    Marital Status: Never married    Allergies: No Known Allergies  Metabolic Disorder Labs: Lab Results  Component Value Date   HGBA1C 5.6 02/12/2023   MPG 128.37 07/16/2019    Lab Results  Component Value Date   PROLACTIN 8.6 07/16/2019   Lab Results  Component Value Date   CHOL 182 02/12/2023   TRIG 73 02/12/2023   HDL 42 02/12/2023   CHOLHDL 4.3 02/12/2023   VLDL 8 07/16/2019   LDLCALC 126 (H) 02/12/2023   LDLCALC 89 04/28/2022   Lab Results  Component Value Date   TSH 1.090 02/12/2023   TSH 1.050 04/28/2022    Therapeutic Level Labs: No results found for: "LITHIUM" No results found for: "VALPROATE" No results found for: "CBMZ"  Current Medications: Current Outpatient Medications  Medication Sig Dispense Refill   ARIPiprazole (ABILIFY) 30 MG tablet Take 1 tablet (30 mg total) by mouth at bedtime. 30 tablet 1   No current facility-administered medications for this visit.     Musculoskeletal:  Strength & Muscle Tone: within normal limits Gait & Station: normal Patient leans: N/A  Psychiatric Specialty Exam: Review of Systems  Constitutional:  Negative for activity change, appetite change and unexpected weight change.  Respiratory:  Negative for chest tightness.   Cardiovascular:  Negative for chest pain.  Neurological:  Negative for seizures and weakness.  Psychiatric/Behavioral:  Negative for dysphoric mood, hallucinations and suicidal ideas.     Blood pressure 121/75, pulse 68, height 5\' 3"  (1.6 m), weight 120 lb (54.4 kg), SpO2 100%.Body mass index is 21.26 kg/m.  General Appearance: Casual  Eye Contact:  Fair  Speech:  Clear and Coherent  Volume:  Normal  Mood:  Euthymic  Affect:  Depressed and Restricted  Thought Process:  Goal Directed; Concrete  Orientation:  Full (Time, Place, and Person)  Thought Content: WDL   Suicidal Thoughts:  No  Homicidal Thoughts:  No  Memory:  Immediate;   Good Recent;   Fair  Judgement:  Fair  Insight:  Lacking  Psychomotor Activity:  Normal  Concentration:  Concentration: Fair  Recall:  NA  Fund of Knowledge: Fair  Language: Fair  Akathisia:  No  Handed:    AIMS (if indicated):  not done  Assets:  Desire for Improvement Housing Resilience Social Support  ADL's:  Intact  Cognition: WNL  Sleep:  Good   Screenings: AIMS    Flowsheet Row Clinical Support from 04/25/2022 in [facility]  AIMS Total Score 0      AUDIT    Flowsheet Row [facility]  Alcohol Use Disorder Identification Test Final Score (AUDIT) 0      GAD-7    Flowsheet Row Counselor from 08/05/2022 in [facility] Counselor from 04/28/2022 in [facility] Counselor from 02/11/2022 in [facility] Video Visit from 01/29/2021 in [facility] Clinical Support from 08/15/2020 in [facility]  Total GAD-7 Score 0 1 6 12 6       PHQ2-9    Flowsheet Row Office Visit from 08/21/2022 in [facility] Most recent reading at 08/21/2022 12:32 PM Office Visit from 04/28/2022 in [facility] [place] Most recent reading at 04/28/2022 11:14 AM Counselor from 04/28/2022 in [facility] Most recent reading at 04/28/2022 10:20 AM Counselor from 02/11/2022 in [facility] Most recent reading at 02/11/2022 10:27 AM Office Visit from 01/03/2022 in [facility] [place] Most recent reading at 01/03/2022  2:14 PM  PHQ-2 Total Score 0 0 0 2 1  PHQ-9 Total Score -- 4 0 5 1      Flowsheet Row ED from 07/22/2020 in [facility] Clinical Support from 04/04/2020 in [facility]  C-SSRS RISK CATEGORY Error: Question 6 not populated No Risk        Assessment and Plan: Today both patient and mom report resurgence of AH, which preceded medication non-compliance. Patient  notes non-compliance due to sedation. He is agreeable to titrating Abilify and moving to bedtime.   Will continue to gain diagnostic clarity, as with resurgence of AH, cannot rule out schizophrenia spectrum disorder. Still with concerns for an autism spectrum disorder.   Commended patient on decreased Black and Mild use and  sustained marijuana cessation.   Psychosis, unspecified, resolved  History of learning disability  Consider IDD and ASD - Increase to Abilify 30mg  - Continue therapy with Paige.  Nicotine Use Disorder, improved - As patient has decreased use on his own, held off on discussing Chantix   Collaboration of Care: Collaboration of Care: Dr. Woodroe Mode  Patient/Guardian was advised Release of Information must be obtained prior to any record release in order to collaborate their care with an outside provider. Patient/Guardian was advised if they have not already done so to contact the registration department to sign all necessary forms in order for [name] to release information regarding their care.   Consent: Patient/Guardian gives verbal consent for treatment and assignment of benefits for services provided during this visit. Patient/Guardian expressed understanding and agreed to proceed.   PGY-3 Lamar Sprinkles, MD 02/27/2023 12:11 PM

## 2023-03-09 ENCOUNTER — Encounter: Payer: Self-pay | Admitting: Family Medicine

## 2023-03-09 ENCOUNTER — Ambulatory Visit: Payer: Medicare Other | Admitting: Family Medicine

## 2023-03-09 VITALS — BP 119/78 | HR 102 | Ht 64.0 in | Wt 121.0 lb

## 2023-03-09 DIAGNOSIS — F39 Unspecified mood [affective] disorder: Secondary | ICD-10-CM

## 2023-03-09 DIAGNOSIS — Z0000 Encounter for general adult medical examination without abnormal findings: Secondary | ICD-10-CM | POA: Diagnosis not present

## 2023-03-09 DIAGNOSIS — Z23 Encounter for immunization: Secondary | ICD-10-CM

## 2023-03-09 NOTE — Patient Instructions (Signed)
Today at your annual preventive visit we talked about the following measures:   I recommend 150 minutes of exercise per week-try 30 minutes 5 days per week I recommend reducing sugary beverages (like soda and juice) and increasing leafy greens and whole fruits.  We discussed avoiding tobacco and alcohol.  I recommend avoiding illicit substances.  Your blood pressure is Normal

## 2023-03-09 NOTE — Progress Notes (Cosign Needed)
    SUBJECTIVE:   Chief compliant/HPI: annual examination  Dustin Tate is a 28 y.o. who presents today for an annual exam.   Denies concerns today  Denies tobacco, alcohol, drug use, sexual activity  Declines STD testing  Denies recent SOB/CP, abd pain, fevers, illness  On abilify, follows w/ psych. No concerns   OBJECTIVE:   BP 119/78   Pulse (!) 102   Ht 5\' 4"  (1.626 m)   Wt 121 lb (54.9 kg)   SpO2 100%   BMI 20.77 kg/m   General: NAD, pleasant, able to participate in exam Cardiac: RRR, no murmurs auscultated Respiratory: CTAB, normal WOB Abdomen: soft, non-tender, non-distended, normoactive bowel sounds Extremities: warm and well perfused, no edema or cyanosis Skin: warm and dry, no rashes noted Neuro: alert, no obvious focal deficits, speech normal   ASSESSMENT/PLAN:    Assessment & Plan Mood disorder (HCC) Stable on abilify, screening labs unremarkable earlier this month, follows closely w/ psych.  Annual physical exam Annual Examination  See AVS for age appropriate recommendations  PHQ score 5, reviewed and discussed. 0 for #9. Follows w/ psych Blood pressure reviewed and at goal.     HIV, hep C previously negative 2021 Declines STD testing or sexual activity Recent bloodwork done w/ psych, unremarkable lipid panel, CMP, CBC, TSH, A1c  Immunizations - HPV, pneumococcal, covid today     Follow up in 1 year or sooner if indicated.    Vonna Drafts, MD [facility]

## 2023-03-16 ENCOUNTER — Ambulatory Visit (INDEPENDENT_AMBULATORY_CARE_PROVIDER_SITE_OTHER): Payer: Medicare Other | Admitting: Clinical

## 2023-03-16 DIAGNOSIS — F29 Unspecified psychosis not due to a substance or known physiological condition: Secondary | ICD-10-CM

## 2023-03-18 NOTE — Progress Notes (Signed)
SEVILLE DOWNS is a 28 y.o. male patient presented to the scheduled appointment oriented times five, appropriately dressed and cooperative. Client denied hallucinations, delusions, suicidal and homicidal ideations. Client reported he is doing well on today. Client reported he is doing well with medication management. Client reported no changes have occurred overall. Client reported at this time he does not want to engage with therapy services. Therapist discussed with him if he can identify goals for treatment in the future he can schedule an appointment. Client reported no crisis needs.     Collaboration of Care: Patient refused AEB none requested by the client.  Patient/Guardian was advised Release of Information must be obtained prior to any record release in order to collaborate their care with an outside provider. Patient/Guardian was advised if they have not already done so to contact the registration department to sign all necessary forms in order for [name] to release information regarding their care.   Consent: Patient/Guardian gives verbal consent for treatment and assignment of benefits for services provided during this visit. Patient/Guardian expressed understanding and agreed to proceed.    Neena Rhymes Jerret Mcbane, LCSW

## 2023-03-24 ENCOUNTER — Telehealth: Payer: Self-pay

## 2023-03-24 ENCOUNTER — Ambulatory Visit (INDEPENDENT_AMBULATORY_CARE_PROVIDER_SITE_OTHER): Payer: Medicare Other | Admitting: Student

## 2023-03-24 ENCOUNTER — Other Ambulatory Visit: Payer: Self-pay | Admitting: Family Medicine

## 2023-03-24 DIAGNOSIS — F19951 Other psychoactive substance use, unspecified with psychoactive substance-induced psychotic disorder with hallucinations: Secondary | ICD-10-CM

## 2023-03-24 DIAGNOSIS — F17200 Nicotine dependence, unspecified, uncomplicated: Secondary | ICD-10-CM

## 2023-03-24 DIAGNOSIS — F1220 Cannabis dependence, uncomplicated: Secondary | ICD-10-CM

## 2023-03-24 DIAGNOSIS — Z87898 Personal history of other specified conditions: Secondary | ICD-10-CM

## 2023-03-24 DIAGNOSIS — Z79899 Other long term (current) drug therapy: Secondary | ICD-10-CM

## 2023-03-24 DIAGNOSIS — F1994 Other psychoactive substance use, unspecified with psychoactive substance-induced mood disorder: Secondary | ICD-10-CM

## 2023-03-24 NOTE — Progress Notes (Signed)
 BH MD/PA/NP OP Progress Note  03/24/2023 12:13 PM  Dustin Tate  MRN:  [id number]  Chief Complaint:  Chief Complaint  Patient presents with   Follow-up   Medication Problem   Hallucinations   HPI: Dustin Tate is a 28 year old male seen today for follow up psychiatric evaluation. He has a psychiatric history of marijuana abuse, depression, SI/SA.  Current medication regimen: Abilify 30 mg daily   Patient reports things are going well. He says the medication (Abilify) "stresses him in the head" but is unable to give further detail.   Voices quieter in morning and at bedtime. Male and male. No longer command hallucinations, but he still "can't say" the content of the discussions. He does report that they say positive things about him.  Appetite intact, and he reports sleeping well through the night.  Mom reports patient is screaming in his sleep multiple nights out of the week. She states he is throwing away medications. She believes he is hearing voices all throughout the day. Mom believes he is actually taking 3 doses weekly, every other day.   When taking medications, managing responsibilities, able to engage. Still smoking marijuana.  A couple weeks ago he did not use marijuana x 1 week. He was calmer, managing responsibilities, engaging in enjoyable activities. He was not taking medications consistently.   Vaping, Black and Milds replaced with nicotine vapes. Smoke shop- pre-rolled THC-A.  Approx 1 week ago,   Denies SI, HI, VH. Both patient and mom confirm.    Visit Diagnosis:    ICD-10-CM   1. 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     2. History of learning disability  Z87.898     3. Nicotine dependence, uncomplicated, unspecified nicotine product type  F17.200     4. Encounter for long-term (current) use of high-risk medication  Z79.899     5. Cannabis use disorder, moderate, dependence (HCC)  F12.20             Past Psychiatric History: Inpatient: Brief psychotic disorder-hospitalized at [facility] 07/2019 approximately 9 days today, only inpatient hospitalization Discharged on Zoloft 50 mg and Seroquel 500 mg nightly Outpatient: At the [facility] behavioral health clinic Therapy: Patient had in-home therapy from ages [age] Patient had an IEP from kindergarten through 12th grade Patient was born 2 weeks premature   Last visit 03/06/2022-patient was in the car with his parents.  Patient was very distracted it was difficult to get a full assessment in patient's.  Did speak to mom there was a lot of concern that patient Seroquel 500 mg is not beneficial as he was having a lot of AVH in responding to internal stimuli.  Medication adjustments were made to titrate patient off of Seroquel and titrated onto Abilify up to 15 mg.  04/2022- patient appears to be doing fairly well on dual Seroquel and Abilify however, as patient is on a high dose of Seroquel, would like to titrate patient off with the goal of being on an antipsychotic monotherapy due to increased risk associated with both medications. being on both medications he has had significant decrease in his auditory hallucinations and responding to internal stimuli.   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  Family Psychiatric History: Unknown   Family History:  Family History  Problem Relation Age  of Onset   Healthy Mother    Healthy Father     Social History:  Social History   Socioeconomic History   Marital status: Single    Spouse name: Not on file   Number of children: Not on file   Years of education: Not on file   Highest education level: Not on file  Occupational History   Occupation: Health and safety inspector: [employer]    Comment: Studies Primary school teacher  Tobacco Use   Smoking status: Every Day    Current  packs/day: 0.10    Average packs/day: 0.1 packs/day for 9.1 years (0.9 ttl pk-yrs)    Types: Cigarettes, Cigars    Start date: 2016   Smokeless tobacco: Never  Vaping Use   Vaping status: Not on file  Substance and Sexual Activity   Alcohol use: No   Drug use: Not Currently    Types: Marijuana   Sexual activity: Not Currently    Partners: Female    Birth control/protection: Condom  Other Topics Concern   Not on file  Social History Scientist, 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   Social Drivers of Health   Financial Resource Strain: Low Risk  (01/16/2020)   Overall Financial Resource Strain (CARDIA)    Difficulty of Paying Living Expenses: Not hard at all  Food Insecurity: No Food Insecurity (01/16/2020)   Hunger Vital Sign    Worried About Running Out of Food in the Last Year: Never true    Ran Out of Food in the Last Year: Never true  Transportation Needs: No Transportation Needs (01/16/2020)   PRAPARE - Administrator, Civil Service (Medical): No    Lack of Transportation (Non-Medical): No  Physical Activity: Insufficiently Active (01/16/2020)   Exercise Vital Sign    Days of Exercise per Week: 3 days    Minutes of Exercise per Session: 30 min  Stress: Stress Concern Present (01/16/2020)   [employer] of Occupational Health - Occupational Stress Questionnaire    Feeling of Stress : To some extent  Social Connections: Socially Isolated (01/16/2020)   Social Connection and Isolation Panel [NHANES]    Frequency of Communication with Friends and Family: Twice a week    Frequency of Social Gatherings with Friends and Family: Once a week    Attends Religious Services: Never    Database administrator or Organizations: No    Attends Engineer, structural: Never    Marital Status: Never married    Allergies: No Known Allergies  Metabolic Disorder Labs: Lab Results  Component Value Date   HGBA1C 5.6 02/12/2023   MPG 128.37 07/16/2019    Lab Results  Component Value Date   PROLACTIN 8.6 07/16/2019   Lab Results  Component Value Date   CHOL 182 02/12/2023   TRIG 73 02/12/2023   HDL 42 02/12/2023   CHOLHDL 4.3 02/12/2023   VLDL 8 07/16/2019   LDLCALC 126 (H) 02/12/2023   LDLCALC 89 04/28/2022   Lab Results  Component Value Date   TSH 1.090 02/12/2023   TSH 1.050 04/28/2022    Therapeutic Level Labs: No results found for: "LITHIUM" No results found for: "VALPROATE" No results found for: "CBMZ"  Current Medications: Current Outpatient Medications  Medication Sig Dispense Refill   ARIPiprazole (ABILIFY) 30 MG tablet Take 1 tablet (30 mg total) by mouth at bedtime. 30 tablet 1   No current facility-administered medications for this visit.     Musculoskeletal: Strength &  Muscle Tone: within normal limits Gait & Station: normal Patient leans: N/A  Psychiatric Specialty Exam: Review of Systems  Constitutional:  Negative for activity change, appetite change and unexpected weight change.  Respiratory:  Negative for chest tightness.   Cardiovascular:  Negative for chest pain.  Neurological:  Negative for seizures and weakness.  Psychiatric/Behavioral:  Negative for dysphoric mood, hallucinations and suicidal ideas.     There were no vitals taken for this visit.There is no height or weight on file to calculate BMI.  General Appearance: Casual and Well Groomed  Eye Contact:  Fair  Speech:  Clear and Coherent  Volume:  Decreased  Mood:  Euthymic  Affect:  Depressed and Restricted  Thought Process:  Goal Directed; Concrete  Orientation:  Full (Time, Place, and Person)  Thought Content: WDL   Suicidal Thoughts:  No  Homicidal Thoughts:  No  Memory:  Immediate;   Good Recent;   Fair  Judgement:  Poor  Insight:  Lacking  Psychomotor Activity:  Normal  Concentration:  Concentration: Fair  Recall:  NA  Fund of Knowledge: Fair  Language: Fair  Akathisia:  No  Handed:    AIMS (if indicated): not  done  Assets:  Desire for Improvement Housing Resilience Social Support  ADL's:  Intact  Cognition: WNL  Sleep:  Good   Screenings: AIMS    Flowsheet Row Clinical Support from 04/25/2022 in [facility]  AIMS Total Score 0      AUDIT    Flowsheet Row [facility]  Alcohol Use Disorder Identification Test Final Score (AUDIT) 0      GAD-7    Flowsheet Row Counselor from 08/05/2022 in [facility] Counselor from 04/28/2022 in [facility] Counselor from 02/11/2022 in [facility] Video Visit from 01/29/2021 in [facility] Clinical Support from 08/15/2020 in [facility]  Total GAD-7 Score 0 1 6 12 6       PHQ2-9    Flowsheet Row Office Visit from 03/09/2023 in [facility] [place] Most recent reading at 03/09/2023  2:24 PM Office Visit from 08/21/2022 in [facility] Most recent reading at 08/21/2022 12:32 PM Office Visit from 04/28/2022 in [facility] - [facility] Dept Of [place] Most recent reading at 04/28/2022 11:14 AM Counselor from 04/28/2022 in [facility] Most recent reading at 04/28/2022 10:20 AM Counselor from 02/11/2022 in [facility] Most recent reading at 02/11/2022 10:27 AM  PHQ-2 Total Score 2 0 0 0 2  PHQ-9 Total Score 3 -- 4 0 5      Flowsheet Row ED from 07/22/2020 in [facility] Clinical Support from 04/04/2020 in [facility]  C-SSRS RISK CATEGORY Error: Question 6 not populated No Risk        Assessment and Plan: Today both patient and mom report continued AH. Patient again notes medication non-compliance, but is unable  to specify a reason.  For the first time today, we have some diagnostic clarity, as patient endorses auditory hallucinations when not compliant with Abilify and smoking THC-A; he has improvement of these hallucinations when smoking THC-A but compliant with Abilify; he has completed eradication of psychotic symptoms when not smoking THC-A and taking  Abilify consistently.  This pattern confirms that his psychotic symptoms are substance-induced.  Encouraged complete marijuana cessation as we do want to ensure that there are no organic psychotic symptoms outside of substance use.  Regardless, we will continue to treat.    Patient does not want to take medications.  We discussed LAI instead, but he prefers to take the oral medications.  Patient advised that if he is unable to maintain compliance, may have to switch to LAI.  As the psychotic features have been disruptive to his daily functioning, recommend that patient stay on an antipsychotic medication, particularly while using THC A. Once patient has achieved sobriety from marijuana, can consider taper of medication to assess for resurgence of psychotic symptoms.  As patient has been inconsistent with Abilify, only taking 3 times per week on average, advised a decrease in dosage to prevent EPS.  As patient has access to 30 mg tablets, advised cutting those in half, taking a daily total of 15 mg.  We will follow up in 4 weeks to assess patient's compliance and determine need for LAI.  Patient poses no safety concerns toward himself nor others at this time.   Substance induced psychosis History of learning disability  Consider IDD and ASD - Decrease to Abilify 15 mg (0.5 tablet of RX already on hand) due to med non-compliance and to mitigate EPS -For antipsychotic safety monitoring, patient's labs all WNL.  Reached out to PCP to have an updated EKG obtained.  Nicotine Use Disorder, improved - Will discuss smoking cessation and follow-up visit; patient  has switched from [facility] and mild cigars to vaping   Collaboration of Care: Collaboration of Care: Dr. Adrian Blackwater  Patient/Guardian was advised Release of Information must be obtained prior to any record release in order to collaborate their care with an outside provider. Patient/Guardian was advised if they have not already done so to contact the registration department to sign all necessary forms in order for [name] to release information regarding their care.   Consent: Patient/Guardian gives verbal consent for treatment and assignment of benefits for services provided during this visit. Patient/Guardian expressed understanding and agreed to proceed.   PGY-3 Lamar Sprinkles, MD 03/24/2023 12:13 PM

## 2023-03-24 NOTE — Telephone Encounter (Signed)
-----   Message from [facility] sent at 03/24/2023 11:56 AM EST ----- Regarding: RN visit for EKG Hey team  I received a message from this patient's psychiatrist stating that he needs an EKG, but they're unable to perform in their clinic.  Can you please call this patient and schedule an RN visit for an EKG?   Future order placed.  Thanks Atif

## 2023-03-24 NOTE — Telephone Encounter (Signed)
 Spoke with patient. Made appt for Mon Feb 24th at 10:00 Nurse visit. I also informed Dr. Barb Merino of his appt day. Aquilla Solian, CMA

## 2023-03-25 NOTE — Addendum Note (Signed)
 Addended by: Tia Masker on: 03/25/2023 10:16 AM   Modules accepted: Level of Service

## 2023-03-30 ENCOUNTER — Ambulatory Visit (INDEPENDENT_AMBULATORY_CARE_PROVIDER_SITE_OTHER): Payer: Medicare Other

## 2023-03-30 ENCOUNTER — Other Ambulatory Visit: Payer: Self-pay

## 2023-03-30 ENCOUNTER — Ambulatory Visit (HOSPITAL_COMMUNITY)
Admission: RE | Admit: 2023-03-30 | Discharge: 2023-03-30 | Disposition: A | Discharge status: Home / Self Care | Payer: Medicare Other | Source: Ambulatory Visit | Attending: Family Medicine | Admitting: Family Medicine

## 2023-03-30 DIAGNOSIS — F1994 Other psychoactive substance use, unspecified with psychoactive substance-induced mood disorder: Secondary | ICD-10-CM | POA: Diagnosis present

## 2023-03-30 DIAGNOSIS — Z0000 Encounter for general adult medical examination without abnormal findings: Secondary | ICD-10-CM

## 2023-03-30 NOTE — Progress Notes (Signed)
 Patient presents to nurse clinic for EKG per request of Dr. Barb Merino. EKG performed and precepted with Dr. Jennette Kettle.   Dr. Jennette Kettle signed off on EKG, placed in Dr. Molli Hazard box.   Patient provided with copy of EKG as well.   Veronda Prude, RN

## 2023-04-06 ENCOUNTER — Ambulatory Visit (HOSPITAL_COMMUNITY): Payer: Medicare Other | Admitting: Clinical

## 2023-04-21 ENCOUNTER — Encounter (HOSPITAL_COMMUNITY): Payer: Medicare Other | Admitting: Student

## 2023-05-15 ENCOUNTER — Ambulatory Visit (INDEPENDENT_AMBULATORY_CARE_PROVIDER_SITE_OTHER): Admitting: Student

## 2023-05-15 VITALS — BP 132/82 | HR 89 | Ht 64.0 in | Wt 121.0 lb

## 2023-05-15 DIAGNOSIS — Z79899 Other long term (current) drug therapy: Secondary | ICD-10-CM | POA: Diagnosis not present

## 2023-05-15 DIAGNOSIS — F19951 Other psychoactive substance use, unspecified with psychoactive substance-induced psychotic disorder with hallucinations: Secondary | ICD-10-CM

## 2023-05-15 DIAGNOSIS — F17200 Nicotine dependence, unspecified, uncomplicated: Secondary | ICD-10-CM

## 2023-05-15 DIAGNOSIS — F1220 Cannabis dependence, uncomplicated: Secondary | ICD-10-CM

## 2023-05-15 DIAGNOSIS — Z87898 Personal history of other specified conditions: Secondary | ICD-10-CM

## 2023-05-15 DIAGNOSIS — F29 Unspecified psychosis not due to a substance or known physiological condition: Secondary | ICD-10-CM

## 2023-05-15 NOTE — Progress Notes (Signed)
 BH MD/PA/NP OP Progress Note  05/15/2023 2:41 PM  Dustin Tate  MRN:  [id number]  Chief Complaint:  Chief Complaint  Patient presents with   Follow-up   Medication Refill   Hallucinations   HPI: Dustin Tate is a 28 year old male seen today for follow up psychiatric evaluation. He has a psychiatric history of marijuana abuse, depression, SI/SA.  Current medication regimen: Abilify 15 mg daily   Patient reports things are "great" since last visit. He is getting sufficient sleep, and feels well rested. He reports that his mood is "okay."  He is semi compliant. Takes for 3 days, then off for 2 days. He forgets to take medications, although mom gives it to him every nights. Differences noted: Decreased anxiety. Voices are about the same. He hears them from the time he wakes until falling asleep. They do not get quieter. Endorses mood swings with voices, as they can say things to upset him. This occurs approx 4 days per week. Approx 2 days where they improve his mood. 1 day of mood stability. Denies commands to harm self or anyone else. Just male voices.   Appetite intact.    Mom reports patient is eating and sleeping well. He has had improvement in his mood and helping at home. She has not heard screaming in his sleep lately, coinciding with medication compliance. She is giving his medications nightly, regardless of if he takes them.  She can tell when he takes his medications. She denies acute concerns.   Vaping, Black and Milds replaced with nicotine vapes. Smoke shop-Denies using pre-rolled THC-A since last week.   Denies SI, HI, VH. Both patient and mom confirm.    Visit Diagnosis:    ICD-10-CM   1. 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     2. History of learning disability  Z87.898     3. Nicotine dependence, uncomplicated, unspecified nicotine product type  F17.200     4. Encounter for long-term (current) use of high-risk  medication  Z79.899     5. Cannabis use disorder, moderate, dependence (HCC)  F12.20     6. Unspecified psychosis not due to a substance or known physiological condition (HCC)  F29             Past Psychiatric History: Inpatient: Brief psychotic disorder-hospitalized at [facility] 07/2019 approximately 9 days today, only inpatient hospitalization Discharged on Zoloft 50 mg and Seroquel 500 mg nightly Outpatient: At the [facility] behavioral health clinic Therapy: Patient had in-home therapy from ages [age] Patient had an IEP from kindergarten through 12th grade Patient was born 2 weeks premature   Last visit 03/06/2022-patient was in the car with his parents.  Patient was very distracted it was difficult to get a full assessment in patient's.  Did speak to mom there was a lot of concern that patient Seroquel 500 mg is not beneficial as he was having a lot of AVH in responding to internal stimuli.  Medication adjustments were made to titrate patient off of Seroquel and titrated onto Abilify up to 15 mg.  04/2022- patient appears to be doing fairly well on dual Seroquel and Abilify however, as patient is on a high dose of Seroquel, would like to titrate patient off with the goal of being on an antipsychotic monotherapy due to increased risk associated with both medications. being on both medications he has had significant decrease in his auditory hallucinations and responding to internal stimuli.   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  Family Psychiatric History: Unknown   Family History:  Family History  Problem Relation Age of Onset   Healthy Mother    Healthy Father     Social History:  Social History   Socioeconomic History   Marital status: Single    Spouse name: Not on file   Number of children: Not on file   Years of education: Not on file    Highest education level: Not on file  Occupational History   Occupation: Health and safety inspector: [employer]    Comment: Studies Primary school teacher  Tobacco Use   Smoking status: Every Day    Current packs/day: 0.10    Average packs/day: 0.1 packs/day for 9.3 years (0.9 ttl pk-yrs)    Types: Cigarettes, Cigars    Start date: 2016   Smokeless tobacco: Never  Vaping Use   Vaping status: Not on file  Substance and Sexual Activity   Alcohol use: No   Drug use: Not Currently    Types: Marijuana   Sexual activity: Not Currently    Partners: Female    Birth control/protection: Condom  Other Topics Concern   Not on file  Social History Scientist, 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   Social Drivers of Health   Financial Resource Strain: Low Risk  (01/16/2020)   Overall Financial Resource Strain (CARDIA)    Difficulty of Paying Living Expenses: Not hard at all  Food Insecurity: No Food Insecurity (01/16/2020)   Hunger Vital Sign    Worried About Running Out of Food in the Last Year: Never true    Ran Out of Food in the Last Year: Never true  Transportation Needs: No Transportation Needs (01/16/2020)   PRAPARE - Administrator, Civil Service (Medical): No    Lack of Transportation (Non-Medical): No  Physical Activity: Insufficiently Active (01/16/2020)   Exercise Vital Sign    Days of Exercise per Week: 3 days    Minutes of Exercise per Session: 30 min  Stress: Stress Concern Present (01/16/2020)   [employer] of Occupational Health - Occupational Stress Questionnaire    Feeling of Stress : To some extent  Social Connections: Socially Isolated (01/16/2020)   Social Connection and Isolation Panel [NHANES]    Frequency of Communication with Friends and Family: Twice a week    Frequency of Social Gatherings with Friends and Family: Once a week    Attends Religious Services: Never    Database administrator or Organizations: No    Attends Engineer, structural:  Never    Marital Status: Never married    Allergies: No Known Allergies  Metabolic Disorder Labs: Lab Results  Component Value Date   HGBA1C 5.6 02/12/2023   MPG 128.37 07/16/2019   Lab Results  Component Value Date   PROLACTIN 8.6 07/16/2019   Lab Results  Component Value Date   CHOL 182 02/12/2023   TRIG 73 02/12/2023   HDL 42 02/12/2023   CHOLHDL 4.3 02/12/2023   VLDL 8 07/16/2019   LDLCALC 126 (H) 02/12/2023   LDLCALC 89 04/28/2022   Lab Results  Component Value Date   TSH 1.090 02/12/2023   TSH 1.050 04/28/2022    Therapeutic Level Labs: No results found for: "LITHIUM" No results found for: "VALPROATE" No results found for: "  CBMZ"  Current Medications: Current Outpatient Medications  Medication Sig Dispense Refill   ARIPiprazole (ABILIFY) 30 MG tablet Take 1 tablet (30 mg total) by mouth at bedtime. 30 tablet 1   No current facility-administered medications for this visit.     Musculoskeletal: Strength & Muscle Tone: within normal limits Gait & Station: normal Patient leans: N/A  Psychiatric Specialty Exam: Review of Systems  Constitutional:  Negative for activity change, appetite change and unexpected weight change.  Respiratory:  Negative for chest tightness.   Cardiovascular:  Negative for chest pain.  Neurological:  Negative for seizures and weakness.  Psychiatric/Behavioral:  Negative for dysphoric mood, hallucinations and suicidal ideas.     Blood pressure 132/82, pulse 89, height 5\' 4"  (1.626 m), weight 121 lb (54.9 kg), SpO2 100%.Body mass index is 20.77 kg/m.  General Appearance: Casual and Well Groomed  Eye Contact:  Fair  Speech:  Clear and Coherent  Volume:  Decreased  Mood:  Euthymic  Affect:  Restricted  Thought Process:  Goal Directed; Concrete  Orientation:  Full (Time, Place, and Person)  Thought Content: WDL   Suicidal Thoughts:  No  Homicidal Thoughts:  No  Memory:  Immediate;   Good Recent;   Fair  Judgement:  Poor   Insight:  Lacking  Psychomotor Activity:  Normal  Concentration:  Concentration: Fair  Recall:  NA  Fund of Knowledge: Fair  Language: Fair  Akathisia:  No  Handed:    AIMS (if indicated): not done  Assets:  Desire for Improvement Housing Resilience Social Support  ADL's:  Intact  Cognition: WNL  Sleep:  Good   Screenings: AIMS    Flowsheet Row Clinical Support from 04/25/2022 in [facility]  AIMS Total Score 0      AUDIT    Flowsheet Row [facility]  Alcohol Use Disorder Identification Test Final Score (AUDIT) 0      GAD-7    Flowsheet Row Counselor from 08/05/2022 in [facility] Counselor from 04/28/2022 in [facility] Counselor from 02/11/2022 in [facility] Video Visit from 01/29/2021 in [facility] Clinical Support from 08/15/2020 in [facility]  Total GAD-7 Score 0 1 6 12 6       PHQ2-9    Flowsheet Row Office Visit from 03/09/2023 in [facility] [place] Most recent reading at 03/09/2023  2:24 PM Office Visit from 08/21/2022 in [facility] Most recent reading at 08/21/2022 12:32 PM Office Visit from 04/28/2022 in [facility] [place] Most recent reading at 04/28/2022 11:14 AM Counselor from 04/28/2022 in [facility] Most recent reading at 04/28/2022 10:20 AM Counselor from 02/11/2022 in [facility] Most recent reading at 02/11/2022 10:27 AM  PHQ-2 Total Score 2 0 0 0 2  PHQ-9 Total Score 3 -- 4 0 5      Flowsheet Row ED from 07/22/2020 in [facility] Clinical Support from 04/04/2020 in [facility]   C-SSRS RISK CATEGORY Error: Question 6 not populated No Risk        Assessment and Plan: Today both patient and mom report continued AH. Patient again notes medication non-compliance, but is more compliant than previously reported,  with a noticeable difference from both himself and mom when he does take his medications.  Again, medication noncompliance and use of THC-A appear to be a factor in the resurgence of his auditory hallucinations.  However, today he does not note improvement of auditory hallucinations on days when he is medication compliant and using THC.  Still do believe that his psychotic symptoms are substance-induced.  Encouraged complete marijuana cessation as we do want to ensure that there are no organic psychotic symptoms outside of substance use.  Regardless, we will continue to treat.  Again discussed the possibility of switching to LAI if patient does not maintain compliance with p.o medications.  He advises that he would like to continue to try improving compliance with p.o., but he is agreeable to switching to an LAI if he is unable to do so consistently.  As the psychotic features have been disruptive to his daily functioning, recommend that patient stay on an antipsychotic medication, particularly while using THC A. Once patient has achieved sobriety from marijuana, can consider taper of medication to assess for resurgence of psychotic symptoms.  We will continue 15 mg of Abilify, and mom is administering his medication to him nightly.  Although he is not always remembering to take his medications, from the time that he obtains the medication to going into his room to take it.  Advised a decrease in dosage to prevent EPS.    Patient poses no safety concerns toward himself nor others at this time.   Substance induced psychosis History of learning disability  Consider IDD and ASD -Continue Abilify 15 mg (0.5 tablet of RX already on hand) due to med non-compliance and to  mitigate EPS; will consider LAI during next visit -For antipsychotic safety monitoring, patient's labs all WNL.  EKG also WNL with QTc of 445  Nicotine Use Disorder, improved -Discussed smoking cessation; although, patient has switched from [facility] and mild cigars to vaping   Collaboration of Care: Collaboration of Care: Dr. Marval Regal  Patient/Guardian was advised Release of Information must be obtained prior to any record release in order to collaborate their care with an outside provider. Patient/Guardian was advised if they have not already done so to contact the registration department to sign all necessary forms in order for [name] to release information regarding their care.   Consent: Patient/Guardian gives verbal consent for treatment and assignment of benefits for services provided during this visit. Patient/Guardian expressed understanding and agreed to proceed.   PGY-3 Lamar Sprinkles, MD 05/15/2023 2:41 PM

## 2023-06-05 ENCOUNTER — Other Ambulatory Visit (HOSPITAL_COMMUNITY): Payer: Self-pay | Admitting: Student

## 2023-06-05 ENCOUNTER — Telehealth (HOSPITAL_COMMUNITY): Payer: Self-pay

## 2023-06-05 DIAGNOSIS — F29 Unspecified psychosis not due to a substance or known physiological condition: Secondary | ICD-10-CM

## 2023-06-05 MED ORDER — ARIPIPRAZOLE 15 MG PO TABS: 15.0000 mg | ORAL_TABLET | Freq: Every day | ORAL | 1 refills | Status: DC

## 2023-06-05 NOTE — Telephone Encounter (Signed)
 Refill of Abilify  15 mg sent to pharmacy.   Dr. Lorna Rose

## 2023-06-05 NOTE — Telephone Encounter (Signed)
 Hello,   I see that you saw this Pt recently, it may have been sent in already however just received a fax from [facility] pharmacy that Pt is requesting for a refill of  his ARIPiprazole  (ABILIFY ) 30 MG tablet. However providers last note states" We will continue 15 mg of Abilify "

## 2023-06-23 ENCOUNTER — Ambulatory Visit (INDEPENDENT_AMBULATORY_CARE_PROVIDER_SITE_OTHER): Admitting: Student

## 2023-06-23 DIAGNOSIS — Z87898 Personal history of other specified conditions: Secondary | ICD-10-CM

## 2023-06-23 DIAGNOSIS — F29 Unspecified psychosis not due to a substance or known physiological condition: Secondary | ICD-10-CM

## 2023-06-23 DIAGNOSIS — Z79899 Other long term (current) drug therapy: Secondary | ICD-10-CM | POA: Diagnosis not present

## 2023-06-23 DIAGNOSIS — F1220 Cannabis dependence, uncomplicated: Secondary | ICD-10-CM

## 2023-06-23 DIAGNOSIS — F17200 Nicotine dependence, unspecified, uncomplicated: Secondary | ICD-10-CM

## 2023-06-23 MED ORDER — ARIPIPRAZOLE ER 300 MG IM PRSY
300.0000 mg | PREFILLED_SYRINGE | INTRAMUSCULAR | Status: AC
  Administered 2023-06-24 – 2024-03-08 (×9): 300 mg via INTRAMUSCULAR

## 2023-06-23 MED ORDER — ABILIFY MAINTENA 300 MG IM PRSY: 300.0000 mg | PREFILLED_SYRINGE | INTRAMUSCULAR | 11 refills | Status: AC

## 2023-06-23 NOTE — Progress Notes (Signed)
 BH MD/PA/NP OP Progress Note  06/23/2023 10:32 AM  Dustin Tate  MRN:  [id number]  Chief Complaint:  Chief Complaint  Patient presents with   Follow-up   Medication Problem   HPI: Dustin Tate is a 28 year old male seen today for follow up psychiatric evaluation. He has a psychiatric history of marijuana abuse, depression, SI/SA.  Current medication regimen: Abilify  15 mg daily   Patient reports things are "great" since last visit. He is getting sufficient sleep, and feels well rested. He reports that his mood is "okay."  He reports he is semi compliant. Mom interjects, stating that he has not been taking medications for the past 3-4 weeks.   Patient denies AH telling him not to take medications. He does acknowledge that the medications help with AVH. He last experienced AH yesterday. He denies VH. He denies feelings of paranoia.   Denies anxiety since last visit. Denies commands to harm self or anyone else. Sometimes male voices, other times no specific gender. Sometimes sounds like his friend, but unsure who.   Showering and hygienic practices approx 4 days per week. Sometimes lacks appetite. Approx 2 days out of the week. Sleeping well most nights. Sometimes does not want to go to sleep, 2-3 days. Will take naps.   Denies SI, HI, VH.   Denies vaping for the past 2 weeks, Black and Milds last used yesterday.  Alcohol Denies. Smoke shop- Denies using pre-rolled THC-A for the past couple of weeks.   Mom reports that he sleeps poorly when staying up to play games on his phone, tablet, or watching TV shows. He is eating well. If he is not smoking, he appears down. She reports that she is still giving his medications, and he will throw it away or hide it under his pillow. Mom denies differences in AH when he is smoking or not. She does note he is more calm, focused, more active, and more responsible when taking medications. When he does not take medications, he is less  motivated and less likely to attend to his hygiene consistently.     Visit Diagnosis:    ICD-10-CM   1. 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 History of learning disability  Z87.898     3. Nicotine  dependence, uncomplicated, unspecified nicotine  product type  F17.200     4. Encounter for long-term (current) use of high-risk medication  Z79.899     5. Cannabis use disorder, moderate, dependence (HCC)  F12.20              Past Psychiatric History: Inpatient: Brief psychotic disorder-hospitalized at [facility] 07/2019 approximately 9 days today, only inpatient hospitalization Discharged on Zoloft  50 mg and Seroquel  500 mg nightly Outpatient: At the [facility] behavioral health clinic Therapy: Patient had in-home therapy from ages [age] Patient had an IEP from kindergarten through 12th grade Patient was born 2 weeks premature   Last visit 03/06/2022-patient was in the car with his parents.  Patient was very distracted it was difficult to get a full assessment in patient's.  Did speak to mom there was a lot of concern that patient Seroquel  500 mg is not beneficial as he was having a lot of AVH in responding to internal stimuli.  Medication adjustments were made to titrate patient off of Seroquel  and titrated onto Abilify  up to 15 mg.  04/2022- patient appears to be doing fairly well on dual Seroquel   and Abilify  however, as patient is on a high dose of Seroquel , would like to titrate patient off with the goal of being on an antipsychotic monotherapy due to increased risk associated with both medications. being on both medications he has had significant decrease in his auditory hallucinations and responding to internal stimuli.   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  Family Psychiatric History: Unknown    Family History:  Family History  Problem Relation Age of Onset   Healthy Mother    Healthy Father     Social History:  Social History   Socioeconomic History   Marital status: Single    Spouse name: Not on file   Number of children: Not on file   Years of education: Not on file   Highest education level: Not on file  Occupational History   Occupation: Health and safety inspector: [employer]    Comment: Studies Primary school teacher  Tobacco Use   Smoking status: Every Day    Current packs/day: 0.10    Average packs/day: 0.1 packs/day for 9.4 years (0.9 ttl pk-yrs)    Types: Cigarettes, Cigars    Start date: 2016   Smokeless tobacco: Never  Vaping Use   Vaping status: Not on file  Substance and Sexual Activity   Alcohol use: No   Drug use: Not Currently    Types: Marijuana   Sexual activity: Not Currently    Partners: Female    Birth control/protection: Condom  Other Topics Concern   Not on file  Social History Scientist, 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   Social Drivers of Health   Financial Resource Strain: Low Risk  (01/16/2020)   Overall Financial Resource Strain (CARDIA)    Difficulty of Paying Living Expenses: Not hard at all  Food Insecurity: No Food Insecurity (01/16/2020)   Hunger Vital Sign    Worried About Running Out of Food in the Last Year: Never true    Ran Out of Food in the Last Year: Never true  Transportation Needs: No Transportation Needs (01/16/2020)   PRAPARE - Administrator, Civil Service (Medical): No    Lack of Transportation (Non-Medical): No  Physical Activity: Insufficiently Active (01/16/2020)   Exercise Vital Sign    Days of Exercise per Week: 3 days    Minutes of Exercise per Session: 30 min  Stress: Stress Concern Present (01/16/2020)   [employer] of Occupational Health - Occupational Stress Questionnaire    Feeling of Stress : To some extent  Social Connections: Socially Isolated (01/16/2020)   Social Connection  and Isolation Panel [NHANES]    Frequency of Communication with Friends and Family: Twice a week    Frequency of Social Gatherings with Friends and Family: Once a week    Attends Religious Services: Never    Database administrator or Organizations: No    Attends Banker Meetings: Never    Marital Status: Never married    Allergies: No Known Allergies  Metabolic Disorder Labs: Lab Results  Component Value Date   HGBA1C 5.6 02/12/2023   MPG 128.37 07/16/2019   Lab Results  Component Value Date   PROLACTIN 8.6 07/16/2019   Lab Results  Component Value Date   CHOL 182 02/12/2023   TRIG 73 02/12/2023   HDL 42 02/12/2023  CHOLHDL 4.3 02/12/2023   VLDL 8 07/16/2019   LDLCALC 126 (H) 02/12/2023   LDLCALC 89 04/28/2022   Lab Results  Component Value Date   TSH 1.090 02/12/2023   TSH 1.050 04/28/2022    Therapeutic Level Labs: No results found for: "LITHIUM" No results found for: "VALPROATE" No results found for: "CBMZ"  Current Medications: Current Outpatient Medications  Medication Sig Dispense Refill   ARIPiprazole  (ABILIFY ) 15 MG tablet Take 1 tablet (15 mg total) by mouth at bedtime. 30 tablet 1   No current facility-administered medications for this visit.     Musculoskeletal: Strength & Muscle Tone: within normal limits Gait & Station: normal Patient leans: N/A  Psychiatric Specialty Exam: Review of Systems  Constitutional:  Negative for activity change, appetite change and unexpected weight change.  Respiratory:  Negative for chest tightness.   Cardiovascular:  Negative for chest pain.  Neurological:  Negative for seizures and weakness.  Psychiatric/Behavioral:  Negative for dysphoric mood, hallucinations and suicidal ideas.     There were no vitals taken for this visit.There is no height or weight on file to calculate BMI.  General Appearance: Casual and Well Groomed  Eye Contact:  Fair  Speech:  Clear and Coherent  Volume:  Decreased   Mood:  Euthymic  Affect:  Restricted  Thought Process:  Goal Directed; Concrete  Orientation:  Full (Time, Place, and Person)  Thought Content: WDL   Suicidal Thoughts:  No  Homicidal Thoughts:  No  Memory:  Immediate;   Good Recent;   Fair  Judgement:  Poor  Insight:  Lacking  Psychomotor Activity:  Normal  Concentration:  Concentration: Fair  Recall:  NA  Fund of Knowledge: Fair  Language: Fair  Akathisia:  No  Handed:    AIMS (if indicated): not done  Assets:  Desire for Improvement Housing Resilience Social Support  ADL's:  Intact  Cognition: WNL  Sleep:  Good   Screenings: AIMS    Flowsheet Row Clinical Support from 04/25/2022 in [facility]  AIMS Total Score 0      AUDIT    Flowsheet Row [facility]  Alcohol Use Disorder Identification Test Final Score (AUDIT) 0      GAD-7    Flowsheet Row Counselor from 08/05/2022 in [facility] Counselor from 04/28/2022 in [facility] Counselor from 02/11/2022 in [facility] Video Visit from 01/29/2021 in [facility] Clinical Support from 08/15/2020 in [facility]  Total GAD-7 Score 0 1 6 12 6       PHQ2-9    Flowsheet Row Office Visit from 03/09/2023 in [facility] - [facility] Dept Of [place] Most recent reading at 03/09/2023  2:24 PM Office Visit from 08/21/2022 in [facility] Most recent reading at 08/21/2022 12:32 PM Office Visit from 04/28/2022 in [facility] - A Dept Of [place] Most recent reading at 04/28/2022 11:14 AM Counselor from 04/28/2022 in [facility] Most recent reading at 04/28/2022 10:20 AM Counselor from 02/11/2022 in [facility] Most recent reading at 02/11/2022 10:27 AM  PHQ-2 Total Score 2 0 0 0 2  PHQ-9 Total Score 3 -- 4 0 5      Flowsheet Row UC from 07/22/2020 in [facility]  at [facility] Clinical Support from 04/04/2020 in [facility]  C-SSRS RISK CATEGORY Error: Question 6 not populated No Risk        Assessment and Plan: Today both patient and mom report continued AH.  During last visit, we discussed that if he is unable to maintain compliance with p.o. medications, there would be the consideration of an LAI.  This is discussed again with the patient and mom, and he is in understanding and agreement with the plan to initiate LAI. Again, medication noncompliance and use of THC-A appear to be a factor in the resurgence of his auditory hallucinations.  The hallucinations seemed to subside a bit when he was not using, but have also emerged again since his last use.  This does leave me a bit unsure as to whether his psychotic symptoms are slowly substance-induced or if there is an element of underlying schizophrenia spectrum disorder.  Either way, ensuring that he has an antipsychotic agent on board that has proven beneficial to him in the past would be appropriate at this time.  As patient has not been compliant with p.o. dosing, we will initiate Abilify  Maintena at lower dosage, with plan to titrate as clinically required.  Patient poses no safety concerns toward himself nor others at this time.   Substance induced psychosis History of learning disability  Consider IDD and ASD - Begin Abilify  Maintena 300 mg q. 28 days.  Oral bridge of Abilify  15 mg daily to continue x 2 weeks. -For antipsychotic safety monitoring, patient's labs all WNL (January 2025).  EKG also WNL with QTc of 445 (February 2025)  Nicotine  Use Disorder, improved -Discussed smoking cessation; although, patient has switched from [facility] and mild cigars to vaping   Collaboration of Care:  Collaboration of Care: Dr. Eligio Grumbling  Patient/Guardian was advised Release of Information must be obtained prior to any record release in order to collaborate their care with an outside provider. Patient/Guardian was advised if they have not already done so to contact the registration department to sign all necessary forms in order for us  to release information regarding their care.   Consent: Patient/Guardian gives verbal consent for treatment and assignment of benefits for services provided during this visit. Patient/Guardian expressed understanding and agreed to proceed.   PGY-3 Shery Done, MD 06/23/2023 10:32 AM

## 2023-06-24 ENCOUNTER — Ambulatory Visit (HOSPITAL_COMMUNITY)

## 2023-06-24 ENCOUNTER — Telehealth (HOSPITAL_COMMUNITY): Payer: Self-pay

## 2023-06-24 VITALS — BP 127/83 | HR 78 | Temp 98.3°F | Ht 63.0 in | Wt 120.0 lb

## 2023-06-24 DIAGNOSIS — F339 Major depressive disorder, recurrent, unspecified: Secondary | ICD-10-CM

## 2023-06-24 NOTE — Progress Notes (Cosign Needed)
 Pt presents today for injection of Abilify  maintenna 300-mg 1.5 ml. Pt presents to be well groomed and taken care of. This was given in patients left deltoid per his request. Pt states that he denies all AVH, SI, and HI. Patient states that he is doing well overall and does not want to make any changes to medications. Patient will be returning in 28 days .  JNL, CMA   Lot: [id number] Exp: 01/2025  NDC: 54098-119-14

## 2023-06-24 NOTE — Telephone Encounter (Signed)
 I put in the order for future fills to be delivered from [place], thanks! Today was short noticed, since we got the appointment scheduled yesterday.  Dr. Lorna Rose

## 2023-06-24 NOTE — Telephone Encounter (Signed)
 Pt presents today at [facility] clinic, he wants his injections Abilify  to be delivered VIA [place] pharmacy so that they will already be at the clinic.  JNL

## 2023-06-30 ENCOUNTER — Ambulatory Visit (INDEPENDENT_AMBULATORY_CARE_PROVIDER_SITE_OTHER): Admitting: Clinical

## 2023-06-30 DIAGNOSIS — F29 Unspecified psychosis not due to a substance or known physiological condition: Secondary | ICD-10-CM | POA: Diagnosis not present

## 2023-06-30 NOTE — Progress Notes (Signed)
   THERAPIST PROGRESS NOTE  Session Time: 16 minutes  Participation Level: Active  Behavioral Response: CasualAlertEuthymic  Type of Therapy: Individual Therapy  Treatment Goals addressed: client will identify at least 1 to 3 goals for therapy treatment   ProgressTowards Goals: Initial  Interventions: CBT  Summary:  Dustin Tate is a 28 y.o. male who presents today for the scheduled appointment oriented x 5, appropriately dressed, and friendly.  Client denied hallucinations and delusions. Client reported he is presenting for a therapy appointment by the request of his mother. Client reported he thinks his mother wanted him to attempt to engage in therapy because he is quiet and to himself.  Client reports he is compliant with his medications and that is working well. Client denied any symptoms of depression, anxiety or psychotic symptoms. Client reported he may have a day once in a blue moon where he feels a bit down but not to an extent of major concern. 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 Client reported he has no identifiable goals to work on in therapy at this time. Client reported he will discuss with his mother the reasoning for not having follow up therapy appointments at this time.  Evidence of progress towards goal:  client PHQ9 and GAD7 is a score 0.  Suicidal/Homicidal: Nowithout intent/plan  Therapist Response:  Therapist began the appointment asking the client how he has been doing. Therapist used cbt to engage with active listening and positive emotional support. Therapist used cbt to ask the client exploratory questions about his motivation for his engagement in therapy and/or any goals he would like to work on. Therapist used cbt to engage and discuss f/u appointments for medication management due to having no identifiable goals ad/or issues needed to address in therapy at this time. Therapist informed the client he  is welcome to reestablish with therapy when he has goals to work on.   Plan: client will continue with med mgt. No therapy f/u at this time.  Diagnosis: unspecified psychosis  Collaboration of Care: Patient refused AEB none.  Patient/Guardian was advised Release of Information must be obtained prior to any record release in order to collaborate their care with an outside provider. Patient/Guardian was advised if they have not already done so to contact the registration department to sign all necessary forms in order for us  to release information regarding their care.   Consent: Patient/Guardian gives verbal consent for treatment and assignment of benefits for services provided during this visit. Patient/Guardian expressed understanding and agreed to proceed.   Courtny Bennison Y Emilia Kayes, LCSW 06/30/2023

## 2023-07-21 ENCOUNTER — Ambulatory Visit (INDEPENDENT_AMBULATORY_CARE_PROVIDER_SITE_OTHER): Admitting: Student

## 2023-07-21 ENCOUNTER — Encounter (HOSPITAL_COMMUNITY): Payer: Self-pay

## 2023-07-21 ENCOUNTER — Ambulatory Visit (INDEPENDENT_AMBULATORY_CARE_PROVIDER_SITE_OTHER)

## 2023-07-21 VITALS — BP 124/81 | HR 63 | Wt 115.8 lb

## 2023-07-21 DIAGNOSIS — F17200 Nicotine dependence, unspecified, uncomplicated: Secondary | ICD-10-CM | POA: Diagnosis not present

## 2023-07-21 DIAGNOSIS — G4700 Insomnia, unspecified: Secondary | ICD-10-CM

## 2023-07-21 DIAGNOSIS — Z87898 Personal history of other specified conditions: Secondary | ICD-10-CM

## 2023-07-21 DIAGNOSIS — Z79899 Other long term (current) drug therapy: Secondary | ICD-10-CM

## 2023-07-21 DIAGNOSIS — F19951 Other psychoactive substance use, unspecified with psychoactive substance-induced psychotic disorder with hallucinations: Secondary | ICD-10-CM

## 2023-07-21 DIAGNOSIS — F3341 Major depressive disorder, recurrent, in partial remission: Secondary | ICD-10-CM

## 2023-07-21 DIAGNOSIS — F411 Generalized anxiety disorder: Secondary | ICD-10-CM

## 2023-07-21 DIAGNOSIS — F1220 Cannabis dependence, uncomplicated: Secondary | ICD-10-CM

## 2023-07-21 DIAGNOSIS — F200 Paranoid schizophrenia: Secondary | ICD-10-CM

## 2023-07-21 NOTE — Progress Notes (Signed)
 Patient presented to the office for Abilify  Maintenna 300 mg injection . Pt tolerated injection well and will return   in 28 days. Pt was given injection in Right Deltiod.

## 2023-07-21 NOTE — Progress Notes (Unsigned)
 BH MD/PA/NP OP Progress Note  07/21/2023 10:10 AM  Edan Q Hinesley  MRN:  [id number]  Chief Complaint:  No chief complaint on file.  HPI: Blakeley Dakin is a 28 year old male seen today for follow up psychiatric evaluation. He has a psychiatric history of marijuana abuse, depression, SI/SA.  Current medication regimen: Abilify  Maintena 300 mg IM   Patient reports things are going well since last visit. He is getting sufficient sleep, and feels well rested. He reports that his mood is okay.  Patient denies acute concerns or complaints today. Patient   Patient denies AH telling him not to take medications. He does acknowledge that the medications help with AVH. He last experienced AH yesterday. He denies VH. He denies feelings of paranoia.   Denies anxiety since last visit. Denies commands to harm self or anyone else. Sometimes male voices, other times no specific gender. Sometimes sounds like his friend, but unsure who.   Showering and hygienic practices approx 4 days per week. Sometimes lacks appetite. Approx 2 days out of the week. Sleeping well most nights. Sometimes does not want to go to sleep, 2-3 days. Will take naps.   Denies SI, HI, VH.   Denies vaping for the past 2 weeks, Black and Milds last used yesterday.  Alcohol Denies. Smoke shop- Denies using pre-rolled THC-A for the past month or month and a half.  Mom reports that he is doing well, eating and sleeping well.  She notes improvement in his mood. Completed oral bridge.   Mom has not noticed AH when he is smoking or not. She does note he is more calm, focused, more active, and more responsible when taking medications. When he does not take medications, he is less motivated and less likely to attend to his hygiene consistently.     Visit Diagnosis:  No diagnosis found.          Past Psychiatric History: Inpatient: Brief psychotic disorder-hospitalized at [facility] 07/2019 approximately 9 days today, only  inpatient hospitalization Discharged on Zoloft  50 mg and Seroquel  500 mg nightly Outpatient: At the [facility] behavioral health clinic Therapy: Patient had in-home therapy from ages [age] Patient had an IEP from kindergarten through 12th grade Patient was born 2 weeks premature   Last visit 03/06/2022-patient was in the car with his parents.  Patient was very distracted it was difficult to get a full assessment in patient's.  Did speak to mom there was a lot of concern that patient Seroquel  500 mg is not beneficial as he was having a lot of AVH in responding to internal stimuli.  Medication adjustments were made to titrate patient off of Seroquel  and titrated onto Abilify  up to 15 mg.  04/2022- patient appears to be doing fairly well on dual Seroquel  and Abilify  however, as patient is on a high dose of Seroquel , would like to titrate patient off with the goal of being on an antipsychotic monotherapy due to increased risk associated with both medications. being on both medications he has had significant decrease in his auditory hallucinations and responding to internal stimuli.   Past Medical History:  Past Medical History:  Diagnosis Date  . Depression   . 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 Sickle cell trait (HCC)    No past surgical history on file.  Family Psychiatric History: Unknown   Family History:  Family History  Problem Relation Age of Onset  . Healthy Mother   . Healthy Father     Social  History:  Social History   Socioeconomic History  . Marital status: Single    Spouse name: Not on file  . Number of children: Not on file  . Years of education: Not on file  . Highest education level: Not on file  Occupational History  . 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 Smoking status: Every Day    Current packs/day: 0.10    Average packs/day: 0.1 packs/day for 9.5 years (0.9 ttl pk-yrs)    Types: Cigarettes, Cigars    Start  date: 2016  . Smokeless tobacco: Never  Vaping Use  . Vaping status: Not on file  Substance and Sexual Activity  . Alcohol use: No  . Drug use: Not Currently    Types: Marijuana  . Sexual activity: Not Currently    Partners: Female    Birth control/protection: Condom  Other Topics Concern  . 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 Difficulty of Paying Living Expenses: Not hard at all  Food Insecurity: No Food Insecurity (01/16/2020)   Hunger Vital Sign   . Worried About Programme researcher, broadcasting/film/video in the Last Year: Never true   . Ran Out of Food in the Last Year: Never true  Transportation Needs: No Transportation Needs (01/16/2020)   PRAPARE - Transportation   . Lack of Transportation (Medical): No   . Lack of Transportation (Non-Medical): No  Physical Activity: Insufficiently Active (01/16/2020)   Exercise Vital Sign   . Days of Exercise per Week: 3 days   . Minutes of Exercise per Session: 30 min  Stress: Stress Concern Present (01/16/2020)   [employer] of Occupational Health - Occupational Stress Questionnaire   . Feeling of Stress : To some extent  Social Connections: Socially Isolated (01/16/2020)   Social Connection and Isolation Panel   . Frequency of Communication with Friends and Family: Twice a week   . Frequency of Social Gatherings with Friends and Family: Once a week   . Attends Religious Services: Never   . Active Member of Clubs or Organizations: No   . Attends Banker Meetings: Never   . Marital Status: Never married    Allergies: No Known Allergies  Metabolic Disorder Labs: Lab Results  Component Value Date   HGBA1C 5.6 02/12/2023   MPG 128.37 07/16/2019   Lab Results  Component Value Date   PROLACTIN 8.6 07/16/2019   Lab Results  Component Value Date   CHOL 182  02/12/2023   TRIG 73 02/12/2023   HDL 42 02/12/2023   CHOLHDL 4.3 02/12/2023   VLDL 8 07/16/2019   LDLCALC 126 (H) 02/12/2023   LDLCALC 89 04/28/2022   Lab Results  Component Value Date   TSH 1.090 02/12/2023   TSH 1.050 04/28/2022    Therapeutic Level Labs: No results found for: LITHIUM No results found for: VALPROATE No results found for: CBMZ  Current Medications: Current Outpatient Medications  Medication Sig Dispense Refill  . ARIPiprazole  (ABILIFY ) 15 MG tablet Take 1 tablet (15 mg total) by mouth at bedtime. 30 tablet 1  . ARIPiprazole  ER (ABILIFY  MAINTENA) 300 MG PRSY prefilled syringe Inject 300 mg into the muscle every 28 (twenty-eight) days. 1 each 11   Current Facility-Administered Medications  Medication Dose  Route Frequency Provider Last Rate Last Admin  . ARIPiprazole  ER (ABILIFY  MAINTENA) 300 MG prefilled syringe 300 mg  300 mg Intramuscular Q28 days    300 mg at 06/24/23 1556     Musculoskeletal: Strength & Muscle Tone: within normal limits Gait & Station: normal Patient leans: N/A  Psychiatric Specialty Exam: Review of Systems  Constitutional:  Negative for activity change, appetite change and unexpected weight change.  Respiratory:  Negative for chest tightness.   Cardiovascular:  Negative for chest pain.  Neurological:  Negative for seizures and weakness.  Psychiatric/Behavioral:  Negative for dysphoric mood, hallucinations and suicidal ideas.     There were no vitals taken for this visit.There is no height or weight on file to calculate BMI.  General Appearance: Casual and Well Groomed  Eye Contact:  Fair  Speech:  Clear and Coherent  Volume:  Decreased  Mood:  Euthymic  Affect:  Restricted  Thought Process:  Goal Directed; Concrete  Orientation:  Full (Time, Place, and Person)  Thought Content: WDL   Suicidal Thoughts:  No  Homicidal Thoughts:  No  Memory:  Immediate;   Good Recent;   Fair  Judgement:  Poor  Insight:  Lacking   Psychomotor Activity:  Normal  Concentration:  Concentration: Fair  Recall:  NA  Fund of Knowledge: Fair  Language: Fair  Akathisia:  No  Handed:    AIMS (if indicated): not done  Assets:  Desire for Improvement Housing Resilience Social Support  ADL's:  Intact  Cognition: WNL  Sleep:  Good   Screenings: AIMS    Flowsheet Row Clinical Support from 04/25/2022 in [facility]  AIMS Total Score 0   AUDIT    Flowsheet Row [facility]  Alcohol Use Disorder Identification Test Final Score (AUDIT) 0   GAD-7    Flowsheet Row Counselor from 08/05/2022 in [facility] Counselor from 04/28/2022 in [facility] Counselor from 02/11/2022 in [facility] Video Visit from 01/29/2021 in [facility] Clinical Support from 08/15/2020 in [facility]  Total GAD-7 Score 0 1 6 12 6    PHQ2-9    Flowsheet Row Office Visit from 03/09/2023 in [facility] [place] Most recent reading at 03/09/2023  2:24 PM Office Visit from 08/21/2022 in [facility] Most recent reading at 08/21/2022 12:32 PM Office Visit from 04/28/2022 in [facility] Of [place]. [facility] Most recent reading at 04/28/2022 11:14 AM Counselor from 04/28/2022 in [facility] Most recent reading at 04/28/2022 10:20 AM Counselor from 02/11/2022 in [facility] Most recent reading at 02/11/2022 10:27 AM  PHQ-2 Total Score 2 0 0 0 2  PHQ-9 Total Score 3 -- 4 0 5   Flowsheet Row UC from 07/22/2020 in [facility] Clinical Support from 04/04/2020 in [facility]  C-SSRS RISK CATEGORY Error: Question 6  not populated No Risk     Assessment and Plan: Today both patient and mom report continued AH.  During last visit, we discussed that if he is unable to maintain compliance with p.o. medications, there would be the consideration of an LAI.  This is discussed again with the patient and mom, and he is in  understanding and agreement with the plan to initiate LAI. Again, medication noncompliance and use of THC-A appear to be a factor in the resurgence of his auditory hallucinations.  The hallucinations seemed to subside a bit when he was not using, but have also emerged again since his last use.  This does leave me a bit unsure as to whether his psychotic symptoms are slowly substance-induced or if there is an element of underlying schizophrenia spectrum disorder.  Either way, ensuring that he has an antipsychotic agent on board that has proven beneficial to him in the past would be appropriate at this time.  As patient has not been compliant with p.o. dosing, we will initiate Abilify  Maintena at lower dosage, with plan to titrate as clinically required.  Patient poses no safety concerns toward himself nor others at this time.   Substance induced psychosis History of learning disability  Consider IDD and ASD - Begin Abilify  Maintena 300 mg q. 28 days.  Oral bridge of Abilify  15 mg daily to continue x 2 weeks. -For antipsychotic safety monitoring, patient's labs all WNL (January 2025).  EKG also WNL with QTc of 445 (February 2025)  Nicotine  Use Disorder, improved -Discussed smoking cessation; although, patient has switched from [facility] and mild cigars to vaping   Collaboration of Care: Collaboration of Care: Dr. Eligio Grumbling  Patient/Guardian was advised Release of Information must be obtained prior to any record release in order to collaborate their care with an outside provider. Patient/Guardian was advised if they have not already done so to contact the registration department to sign all necessary  forms in order for us  to release information regarding their care.   Consent: Patient/Guardian gives verbal consent for treatment and assignment of benefits for services provided during this visit. Patient/Guardian expressed understanding and agreed to proceed.   PGY-3 Shery Done, MD 07/21/2023 10:10 AM

## 2023-08-18 ENCOUNTER — Ambulatory Visit (INDEPENDENT_AMBULATORY_CARE_PROVIDER_SITE_OTHER)

## 2023-08-18 VITALS — BP 131/72 | HR 77 | Ht 63.0 in | Wt 116.8 lb

## 2023-08-18 DIAGNOSIS — F3341 Major depressive disorder, recurrent, in partial remission: Secondary | ICD-10-CM | POA: Diagnosis not present

## 2023-08-18 DIAGNOSIS — F339 Major depressive disorder, recurrent, unspecified: Secondary | ICD-10-CM

## 2023-08-18 NOTE — Progress Notes (Cosign Needed)
 Pt presents today for injection of Abilify  maintenna 300-mg 1.5 ml. Pt presents to be well groomed and taken care of. This was given in patients LEFT-DELTIOD. Pt states that he denies all AVH, SI, and H  I. Patient states that he is doing well overall and does not want to make any changes to medications. Patient will be returning in 28 days .   Patient had to use a sample that was prepared to a 300 mg injection 1.5 ML   JNL, CMA

## 2023-09-16 NOTE — Progress Notes (Signed)
 BH MD Outpatient Progress Note  09/17/2023 5:02 PM ATUL DELUCIA  MRN:  [id number]  Assessment:  Dustin Tate presents for follow-up evaluation in-person on 09/17/23 .   Patient was accompanied by his mother and received his long-acting injectable antipsychotic today, which he tolerated well without concerns. Majority of interview completed with mother due to notable paucity of speech, which she reports is at his baseline. He did not appear to be responding to internal stimuli, no overt signs of psychosis. No changes to current medications. No acute safety concerns at this time. Will continue to monitor.  Identifying Information: Dustin Tate is a 28 y.o. male with a history of marijuana abuse, nicotine  use disorder, substance induced psychosis, hx of learning disability, depression, SI/SA  who is an established patient with [facility] for management of medications and mood. He is formerly a patient of Dr. Rainelle. For a comprehensive history and detailed assessment, please refer to the initial adult assessment.   Plan:  # Substance induced psychosis #History of learning disability  Consider IDD and ASD Past medication trials:  Status of problem: new to me Interventions: -- Continue Abilify  maintenna 300-mg q28days ( last given 08/18/2023 per chart review) -- For antipsychotic safety monitoring, patient's labs all WNL.  EKG also WNL with QTc of 445 -- Continue therapy with Paige (has been attending since 2022)  # Nicotine  Use Disorder, improved  Past medication trials:  Status of problem: active, unchanged form prior Interventions: -- Discussed smoking cessation; continues smoking black and milds    Patient was given contact information for behavioral health clinic and was instructed to call 911 for emergencies.   Subjective:  Chief Complaint:  Chief Complaint  Patient presents with   Follow-up    Interval History:  The patient reports  improvement in anxiety since the last visit and denies any symptoms of depression. He notes improved hygiene and household functioning, and his mood has remained stable without any mood swings. He denies cannabis use, auditory hallucinations, or any other substance use. Sleep is reported as good with no concerns, and he maintains a regular appetite, eating three meals a day. Smoking habits remain unchanged at two [facility] & Mild cigars daily. His mother feels he is currently at his baseline.  Per patient's mother, patient continues to have difficulty focusing, and his cognitive limitations are significant enough to prevent him from obtaining or maintaining employment; he is not cognitively at the level required for job placement. He has a history of poor adherence to taking pills. Per mother, support group options provided by Dr. Rainelle were explored, but mother reports patient did not find them appealing.  Visit Diagnosis:    ICD-10-CM   1. 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       Past Psychiatric History: Inpatient: Brief psychotic disorder-hospitalized at [facility] 07/2019 approximately 9 days today, only inpatient hospitalization Discharged on Zoloft  50 mg and Seroquel  500 mg nightly Outpatient: At the [facility] behavioral health clinic Therapy hx: Patient had in-home therapy from ages [age] Patient had an IEP from kindergarten through 12th grade Patient was born 2 weeks premature  Visit hx--  06/23/2023 -- planned for transition from PO abilify  to abilify  maintenna,  first injection given on 06/24/2023  03/06/2022-patient was in the car with his parents.  Patient was very distracted it was difficult to get a full assessment in patient's.  Did speak to mom there was a lot of concern that patient Seroquel   500 mg is not beneficial as he was having a lot of AVH in responding to internal stimuli.  Medication adjustments were made to titrate patient off of Seroquel and  titrated onto Abilify up to 15 mg.   04/2022- patient appears to be doing fairly well on dual Seroquel and Abilify however, as patient is on a high dose of Seroquel, would like to titrate patient off with the goal of being on an antipsychotic monotherapy due to increased risk associated with both medications. being on both medications he has had significant decrease in his auditory hallucinations and responding to internal stimuli.   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   Social Drivers of Health   Financial Resource Strain: Low Risk  (01/16/2020)   Overall Financial Resource Strain (CARDIA)    Difficulty of Paying Living Expenses: Not hard at all  Food Insecurity: No Food Insecurity (01/16/2020)   Hunger Vital Sign    Worried About Running Out of Food in the Last Year: Never true    Ran Out of Food in the Last Year: Never true  Transportation Needs: No Transportation Needs (01/16/2020)   PRAPARE - Administrator, Civil Service (Medical): No    Lack of Transportation (Non-Medical): No  Physical Activity: Insufficiently Active (01/16/2020)   Exercise Vital Sign    Days of  Exercise per Week: 3 days    Minutes of Exercise per Session: 30 min  Stress: Stress Concern Present (01/16/2020)   [employer] of Occupational Health - Occupational Stress Questionnaire    Feeling of Stress : To some extent  Social Connections: Socially Isolated (01/16/2020)   Social Connection and Isolation Panel    Frequency of Communication with Friends and Family: Twice a week    Frequency of Social Gatherings with Friends and Family: Once a week    Attends Religious Services: Never    Database administrator or Organizations: No    Attends Engineer, structural: Never    Marital Status: Never married    Allergies: No Known Allergies  Current Medications: Current Outpatient Medications  Medication Sig Dispense Refill   ARIPiprazole ER (ABILIFY MAINTENA) 300 MG PRSY prefilled syringe Inject 300 mg into the muscle every 28 (twenty-eight) days. 1 each 11   Current Facility-Administered Medications  Medication Dose Route Frequency Provider Last Rate Last Admin   ARIPiprazole ER (ABILIFY MAINTENA) 300 MG prefilled syringe 300 mg  300 mg Intramuscular Q28 days    300 mg at 09/17/23 1051  ROS: Review of Systems  All other systems reviewed and are negative.   Objective:  Objective: Psychiatric Specialty Exam: General Appearance: well groomed, meticulous  Eye Contact:  Good    Speech:  Clear, coherent, decreased rate; paucity of speech  Volume:  Decreased  Mood:  see above  Affect:  Appropriate, congruent, full range  Thought Content: limited  Suicidal Thoughts: see subjective  Thought Process:  Coherent  Orientation:  A&Ox4   Memory:  Immediate good  Judgment:  Fair  Insight:  Poor  Concentration:  Attention and concentration good   Recall:  [employer] of Knowledge: Fair  Language: Good, fluent  Psychomotor Activity: Normal  Akathisia:  NA   AIMS (if indicated): NA   Assets:   Housing Social Support  ADL's:  Intact  Cognition: WNL  Sleep: see  above  Appetite: see above    Physical Exam Vitals reviewed.  HENT:     Head: Normocephalic and atraumatic.  Eyes:     Extraocular Movements: Extraocular movements intact.     Conjunctiva/sclera: Conjunctivae normal.  Pulmonary:     Effort: Pulmonary effort is normal. No respiratory distress.  Musculoskeletal:        General: Normal range of motion.  Neurological:     General: No focal deficit present.      Metabolic Disorder Labs: Lab Results  Component Value Date   HGBA1C 5.6 02/12/2023   MPG 128.37 07/16/2019   Lab Results  Component Value Date   PROLACTIN 8.6 07/16/2019   Lab Results  Component Value Date   CHOL 182 02/12/2023   TRIG 73 02/12/2023   HDL 42 02/12/2023   CHOLHDL 4.3 02/12/2023   VLDL 8 07/16/2019   LDLCALC 126 (H) 02/12/2023   LDLCALC 89 04/28/2022   Lab Results  Component Value Date   TSH 1.090 02/12/2023   TSH 1.050 04/28/2022    Therapeutic Level Labs: No results found for: LITHIUM No results found for: VALPROATE No results found for: CBMZ  Screenings:  AIMS    Flowsheet Row Clinical Support from 04/25/2022 in [facility]  AIMS Total Score 0   AUDIT    Flowsheet Row [facility]  Alcohol Use Disorder Identification Test Final Score (AUDIT) 0   GAD-7    Flowsheet Row Counselor from 08/05/2022 in [facility] Counselor from 04/28/2022 in [facility] Counselor from 02/11/2022 in [facility] Video Visit from 01/29/2021 in [facility] Clinical Support from 08/15/2020 in [facility]  Total GAD-7 Score 0 1 6 12 6    PHQ2-9    Flowsheet Row Office Visit from 03/09/2023 in [facility] Of [place]. [facility] Most recent reading at 03/09/2023  2:24 PM Office Visit  from 08/21/2022 in [facility] Most recent reading at 08/21/2022 12:32 PM Office Visit from 04/28/2022 in [facility] Of [place]. [facility] Most recent reading at 04/28/2022 11:14 AM Counselor from 04/28/2022 in [facility] Most recent reading at 04/28/2022 10:20 AM Counselor from 02/11/2022 in [facility] Most recent reading at 02/11/2022 10:27 AM  PHQ-2 Total Score 2 0 0 0 2  PHQ-9 Total Score 3 -- 4 0 5   Flowsheet Row UC from 07/22/2020 in [facility] Clinical Support from 04/04/2020 in [facility]  C-SSRS RISK CATEGORY Error: Question 6 not populated No Risk    Collaboration of Care:   Patient/Guardian was advised Release of Information must be obtained prior to any record release in order to collaborate their care with an outside provider. Patient/Guardian was advised if they have not already done so to contact the registration department to sign all necessary forms in order for us  to release information regarding their care.   Consent: Patient/Guardian gives verbal consent for treatment and assignment of benefits for services provided during this visit. Patient/Guardian expressed understanding and agreed to proceed.    Marlo Masson, MD 09/17/2023, 5:02 PM

## 2023-09-17 ENCOUNTER — Ambulatory Visit (INDEPENDENT_AMBULATORY_CARE_PROVIDER_SITE_OTHER): Admitting: Student in an Organized Health Care Education/Training Program

## 2023-09-17 ENCOUNTER — Ambulatory Visit (INDEPENDENT_AMBULATORY_CARE_PROVIDER_SITE_OTHER)

## 2023-09-17 ENCOUNTER — Encounter (HOSPITAL_COMMUNITY): Payer: Self-pay

## 2023-09-17 VITALS — BP 124/70 | HR 80 | Ht 68.0 in | Wt 108.0 lb

## 2023-09-17 DIAGNOSIS — F19951 Other psychoactive substance use, unspecified with psychoactive substance-induced psychotic disorder with hallucinations: Secondary | ICD-10-CM | POA: Diagnosis not present

## 2023-09-17 DIAGNOSIS — F29 Unspecified psychosis not due to a substance or known physiological condition: Secondary | ICD-10-CM

## 2023-09-17 NOTE — Progress Notes (Cosign Needed)
 Patient presented to office for injection of Abilify  300 mg. Patient's mother was present for appointment. Patient received injection in right deltoid. Patient tolerated injection well. Patient denies SI/HI/AVH. No other complaints noted. Patient will return in 28 days.

## 2023-10-15 ENCOUNTER — Ambulatory Visit (INDEPENDENT_AMBULATORY_CARE_PROVIDER_SITE_OTHER)

## 2023-10-15 ENCOUNTER — Encounter (HOSPITAL_COMMUNITY): Admitting: Student in an Organized Health Care Education/Training Program

## 2023-10-15 ENCOUNTER — Telehealth (HOSPITAL_COMMUNITY): Payer: Self-pay | Admitting: Student in an Organized Health Care Education/Training Program

## 2023-10-15 VITALS — BP 124/80 | HR 77 | Ht 68.0 in | Wt 112.0 lb

## 2023-10-15 DIAGNOSIS — F3341 Major depressive disorder, recurrent, in partial remission: Secondary | ICD-10-CM

## 2023-10-15 NOTE — Progress Notes (Cosign Needed)
 Pt presents today for injection of Abilify  maintenna 300-mg 1.5 ml. Pt presents to be well groomed and taken care of. This was given in patients LEFT-DELTIOD. Pt states that he denies all AVH, SI, and H I. Patient states that he is doing well overall and does not want to make any changes to medications. Patient will be returning in 28 days.  JNL,CMA

## 2023-10-15 NOTE — Telephone Encounter (Signed)
 Patient did not show up for the appointment.  I contacted and spoke with the patient at his mobile number [phone].  Patient reports that he is running late and will be unable to make it to his appointment.  Encourage patient to call our front desk to reschedule the appointment.  Esha Fincher Carrin Carrero, MD PGY-3, [facility]

## 2023-10-15 NOTE — Progress Notes (Deleted)
 BH MD Outpatient Progress Note  10/15/2023 7:48 AM Dustin Tate  MRN:  [id number]  Assessment:  Dustin Tate presents for follow-up evaluation in-person on 10/15/23 .   ***  Patient was accompanied by his mother and received his long-acting injectable antipsychotic today, which he tolerated well without concerns. Majority of interview completed with mother due to notable paucity of speech, which she reports is at his baseline. He did not appear to be responding to internal stimuli, no overt signs of psychosis. No changes to current medications. No acute safety concerns at this time. Will continue to monitor.  Identifying Information: Dustin Tate is a 28 y.o. male with a history of marijuana abuse, nicotine  use disorder, substance induced psychosis, hx of learning disability, depression, SI/SA  who is an established patient with [facility] for management of medications and mood. He is formerly a patient of Dr. Rainelle. For a comprehensive history and detailed assessment, please refer to the initial adult assessment.   Plan:  # Substance induced psychosis #History of learning disability  Consider IDD and ASD Past medication trials:  Status of problem: new to me Interventions: -- ***Continue Abilify  maintenna 300-mg q28days ( last given 08/18/2023 per chart review) -- ***For antipsychotic safety monitoring, patient's labs all WNL.  EKG also WNL with QTc of 445 -- Continue therapy with Paige (has been attending since 2022)  # Nicotine  Use Disorder, improved  Past medication trials:  Status of problem: active, unchanged form prior Interventions: -- ***Discussed smoking cessation; continues smoking black and milds    Patient was given contact information for behavioral health clinic and was instructed to call 911 for emergencies.   Subjective:  Chief Complaint:  No chief complaint on file.   Interval History:  During the patient's previous  visit, *** was discussed, which they report ***.  AEs to medications: Medication compliance (missing doses, taking as directed):  Sleep: Appetite: Caffeine: Recent substance use: SI: Impact on functioning: ***   ----*** The patient reports improvement in anxiety since the last visit and denies any symptoms of depression. He notes improved hygiene and household functioning, and his mood has remained stable without any mood swings. He denies cannabis use, auditory hallucinations, or any other substance use. Sleep is reported as good with no concerns, and he maintains a regular appetite, eating three meals a day. Smoking habits remain unchanged at two [facility] cigars daily. His mother feels he is currently at his baseline.  Per patient's mother, patient continues to have difficulty focusing, and his cognitive limitations are significant enough to prevent him from obtaining or maintaining employment; he is not cognitively at the level required for job placement. He has a history of poor adherence to taking pills. Per mother, support group options provided by Dr. Rainelle were explored, but mother reports patient did not find them appealing.  Visit Diagnosis:  No diagnosis found.   Past Psychiatric History:  Inpatient: Brief psychotic disorder-hospitalized at [facility] 07/2019 approximately 9 days today, only inpatient hospitalization Discharged on Zoloft  50 mg and Seroquel  500 mg nightly Outpatient: At the [facility] behavioral health clinic Therapy hx: Patient had in-home therapy from ages 4-5 Patient had an IEP from kindergarten through 12th grade Patient was born 2 weeks premature  Additional Outpatient History: 09/17/2023 --started guanfacine 1 mg nightly to address concerns about hyperactivity/restlessness  06/23/2023 -- planned for transition from PO abilify  to abilify  maintenna,  first injection given on 06/24/2023  03/06/2022-patient was in the  car with his parents.  Patient was very  distracted it was difficult to get a full assessment in patient's.  Did speak to mom there was a lot of concern that patient Seroquel  500 mg is not beneficial as he was having a lot of AVH in responding to internal stimuli.  Medication adjustments were made to titrate patient off of Seroquel  and titrated onto Abilify  up to 15 mg.   04/2022- patient appears to be doing fairly well on dual Seroquel  and Abilify  however, as patient is on a high dose of Seroquel , would like to titrate patient off with the goal of being on an antipsychotic monotherapy due to increased risk associated with both medications. being on both medications he has had significant decrease in his auditory hallucinations and responding to internal stimuli.   Social History   Socioeconomic History   Marital status: Single    Spouse name: Not on file   Number of children: Not on file   Years of education: Not on file   Highest education level: Not on file  Occupational History   Occupation: Health and safety inspector: [employer]    Comment: Studies Primary school teacher  Tobacco Use   Smoking status: Every Day    Current packs/day: 0.10    Average packs/day: 0.1 packs/day for 9.7 years (1.0 ttl pk-yrs)    Types: Cigarettes, Cigars    Start date: 2016   Smokeless tobacco: Never  Vaping Use   Vaping status: Not on file  Substance and Sexual Activity   Alcohol use: No   Drug use: Not Currently    Types: Marijuana   Sexual activity: Not Currently    Partners: Female    Birth control/protection: Condom  Other Topics Concern   Not on file  Social History Scientist, 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   Social Drivers of Health   Financial Resource Strain: Low Risk  (01/16/2020)   Overall Financial Resource Strain (CARDIA)    Difficulty of Paying Living Expenses: Not hard at all  Food Insecurity: No Food Insecurity (01/16/2020)   Hunger Vital Sign    Worried About Running Out of Food in the Last Year: Never true    Ran Out of  Food in the Last Year: Never true  Transportation Needs: No Transportation Needs (01/16/2020)   PRAPARE - Administrator, Civil Service (Medical): No    Lack of Transportation (Non-Medical): No  Physical Activity: Insufficiently Active (01/16/2020)   Exercise Vital Sign    Days of Exercise per Week: 3 days    Minutes of Exercise per Session: 30 min  Stress: Stress Concern Present (01/16/2020)   [employer] of Occupational Health - Occupational Stress Questionnaire    Feeling of Stress : To some extent  Social Connections: Socially Isolated (01/16/2020)   Social Connection and Isolation Panel    Frequency of Communication with Friends and Family: Twice a week    Frequency of Social Gatherings with Friends and Family: Once a week    Attends Religious Services: Never    Database administrator or Organizations: No    Attends Engineer, structural: Never    Marital Status: Never married    Allergies: No Known Allergies  Current Medications: Current Outpatient Medications  Medication Sig Dispense Refill   ARIPiprazole  ER (ABILIFY  MAINTENA) 300 MG PRSY prefilled syringe Inject 300 mg into the muscle every 28 (twenty-eight) days. 1 each 11   Current Facility-Administered Medications  Medication Dose  Route Frequency Provider Last Rate Last Admin   ARIPiprazole  ER (ABILIFY  MAINTENA) 300 MG prefilled syringe 300 mg  300 mg Intramuscular Q28 days    300 mg at 09/17/23 1051    ROS: Review of Systems  All other systems reviewed and are negative.   Objective:  Objective: Psychiatric Specialty Exam: General Appearance: well groomed, meticulous  Eye Contact:  Good    Speech:  Clear, coherent, decreased rate; paucity of speech  Volume:  Decreased  Mood:  see above  Affect:  Appropriate, congruent, full range  Thought Content: limited  Suicidal Thoughts: see subjective  Thought Process:  Coherent  Orientation:  A&Ox4   Memory:  Immediate good  Judgment:   Fair  Insight:  Poor  Concentration:  Attention and concentration good   Recall:  [employer] of Knowledge: Fair  Language: Good, fluent  Psychomotor Activity: Normal  Akathisia:  NA   AIMS (if indicated): NA   Assets:   Housing Social Support  ADL's:  Intact  Cognition: WNL  Sleep: see above  Appetite: see above    Physical Exam Vitals reviewed.  HENT:     Head: Normocephalic and atraumatic.  Eyes:     Extraocular Movements: Extraocular movements intact.     Conjunctiva/sclera: Conjunctivae normal.  Pulmonary:     Effort: Pulmonary effort is normal. No respiratory distress.  Musculoskeletal:        General: Normal range of motion.  Neurological:     General: No focal deficit present.      Metabolic Disorder Labs: Lab Results  Component Value Date   HGBA1C 5.6 02/12/2023   MPG 128.37 07/16/2019   Lab Results  Component Value Date   PROLACTIN 8.6 07/16/2019   Lab Results  Component Value Date   CHOL 182 02/12/2023   TRIG 73 02/12/2023   HDL 42 02/12/2023   CHOLHDL 4.3 02/12/2023   VLDL 8 07/16/2019   LDLCALC 126 (H) 02/12/2023   LDLCALC 89 04/28/2022   Lab Results  Component Value Date   TSH 1.090 02/12/2023   TSH 1.050 04/28/2022    Therapeutic Level Labs: No results found for: LITHIUM No results found for: VALPROATE No results found for: CBMZ  Screenings:  AIMS    Flowsheet Row Clinical Support from 04/25/2022 in [facility]  AIMS Total Score 0   AUDIT    Flowsheet Row [facility]  Alcohol Use Disorder Identification Test Final Score (AUDIT) 0   GAD-7    Flowsheet Row Counselor from 08/05/2022 in [facility] Counselor from 04/28/2022 in [facility] Counselor from 02/11/2022 in [facility] Video Visit from 01/29/2021 in [facility] Clinical Support from 08/15/2020 in [facility]  Total GAD-7 Score 0 1 6 12 6    PHQ2-9    Flowsheet Row Office Visit from 03/09/2023 in [facility]. [facility] Most recent reading at 03/09/2023  2:24 PM Office Visit from 08/21/2022 in [facility] Most recent reading at 08/21/2022 12:32 PM Office Visit from 04/28/2022 in [facility] Of [facility]. [facility] Most recent reading at 04/28/2022 11:14 AM Counselor from 04/28/2022 in [facility] Most recent reading at 04/28/2022 10:20 AM Counselor from 02/11/2022 in [facility]  Most recent reading at 02/11/2022 10:27 AM  PHQ-2 Total Score 2 0 0 0 2  PHQ-9 Total Score 3 -- 4 0 5   Flowsheet Row UC from 07/22/2020 in [facility] Clinical Support from 04/04/2020 in [facility]  C-SSRS RISK CATEGORY Error: Question 6 not populated No Risk    Collaboration of Care:   Patient/Guardian was advised Release of Information must be obtained prior to any record release in order to collaborate their care with an outside provider. Patient/Guardian was advised if they have not already done so to contact the registration department to sign all necessary forms in order for us  to release information regarding their care.   Consent: Patient/Guardian gives verbal consent for treatment and assignment of benefits for services provided during this visit. Patient/Guardian expressed understanding and agreed to proceed.    Marlo Masson, MD 10/15/2023, 7:48 AM

## 2023-10-20 ENCOUNTER — Encounter (HOSPITAL_COMMUNITY): Admitting: Student in an Organized Health Care Education/Training Program

## 2023-11-12 ENCOUNTER — Ambulatory Visit

## 2023-11-12 ENCOUNTER — Ambulatory Visit (INDEPENDENT_AMBULATORY_CARE_PROVIDER_SITE_OTHER)

## 2023-11-12 ENCOUNTER — Encounter (HOSPITAL_COMMUNITY): Payer: Self-pay

## 2023-11-12 VITALS — BP 136/80 | HR 68 | Resp 15 | Ht 68.0 in | Wt 115.0 lb

## 2023-11-12 VITALS — Ht 64.0 in | Wt 115.0 lb

## 2023-11-12 DIAGNOSIS — F29 Unspecified psychosis not due to a substance or known physiological condition: Secondary | ICD-10-CM

## 2023-11-12 DIAGNOSIS — Z0000 Encounter for general adult medical examination without abnormal findings: Secondary | ICD-10-CM | POA: Diagnosis not present

## 2023-11-12 NOTE — Patient Instructions (Signed)
 [name],  Thank you for taking the time for your Medicare Wellness Visit. I appreciate your continued commitment to your health goals. Please review the care plan we discussed, and feel free to reach out if I can assist you further.  Medicare recommends these wellness visits once per year to help you and your care team stay ahead of potential health issues. These visits are designed to focus on prevention, allowing your provider to concentrate on managing your acute and chronic conditions during your regular appointments.  Please note that Annual Wellness Visits do not include a physical exam. Some assessments may be limited, especially if the visit was conducted virtually. If needed, we may recommend a separate in-person follow-up with your provider.  Ongoing Care Seeing your primary care provider every 3 to 6 months helps us  monitor your health and provide consistent, personalized care.   Referrals If a referral was made during today's visit and you haven't received any updates within two weeks, please contact the referred provider directly to check on the status.  Recommended Screenings:  Health Maintenance  Topic Date Due   Hepatitis B Vaccine (1 of 3 - 19+ 3-dose series) Never done   Flu Shot  09/04/2023   COVID-19 Vaccine (6 - 2025-26 season) 10/05/2023   Medicare Annual Wellness Visit  11/11/2024   DTaP/Tdap/Td vaccine (2 - Td or Tdap) 08/21/2027   Pneumococcal Vaccine  Completed   HPV Vaccine  Completed   Hepatitis C Screening  Completed   HIV Screening  Completed   Meningitis B Vaccine  Aged Out       11/12/2023    1:39 PM  Advanced Directives  Does Patient Have a Medical Advance Directive? No  Would patient like information on creating a medical advance directive? No - Patient declined   Advance Care Planning is important because it: Ensures you receive medical care that aligns with your values, goals, and preferences. Provides guidance to your family and loved  ones, reducing the emotional burden of decision-making during critical moments.  Vision: Annual vision screenings are recommended for early detection of glaucoma, cataracts, and diabetic retinopathy. These exams can also reveal signs of chronic conditions such as diabetes and high blood pressure.  Dental: Annual dental screenings help detect early signs of oral cancer, gum disease, and other conditions linked to overall health, including heart disease and diabetes.  Please see the attached documents for additional preventive care recommendations.

## 2023-11-12 NOTE — Progress Notes (Cosign Needed)
 Patient presented to office for injection of Abilify  Maintena 300 mg. Patient received injection in right deltoid . Patient tolerated injection well. Patient denies SI/HI/AVH. Patient denies any concerns. Patient will return in 28 days.

## 2023-11-12 NOTE — Progress Notes (Signed)
 Because this visit was a virtual/telehealth visit,  certain criteria was not obtained, such a blood pressure, CBG if applicable, and timed get up and go. Any medications not marked as taking were not mentioned during the medication reconciliation part of the visit. Any vitals not documented were not able to be obtained due to this being a telehealth visit or patient was unable to self-report a recent blood pressure reading due to a lack of equipment at home via telehealth. Vitals that have been documented are verbally provided by the patient.   Subjective:   Dustin Tate is a 28 y.o. who presents for a Medicare Wellness preventive visit.  As a reminder, Annual Wellness Visits don't include a physical exam, and some assessments may be limited, especially if this visit is performed virtually. We may recommend an in-person follow-up visit with your provider if needed.  Visit Complete: Virtual I connected with  Dustin Tate on 11/12/23 by a audio enabled telemedicine application and verified that I am speaking with the correct person using two identifiers.  Patient Location: Home  Provider Location: Home Office  I discussed the limitations of evaluation and management by telemedicine. The patient expressed understanding and agreed to proceed.  Vital Signs: Because this visit was a virtual/telehealth visit, some criteria may be missing or patient reported. Any vitals not documented were not able to be obtained and vitals that have been documented are patient reported.  VideoError- Librarian, academic were attempted between this provider and patient, however failed, due to patient having technical difficulties OR patient did not have access to video capability.  We continued and completed visit with audio only.   Persons Participating in Visit: Patient.  AWV Questionnaire: No: Patient Medicare AWV questionnaire was not completed prior to this  visit.  Cardiac Risk Factors include: male gender     Objective:    Today's Vitals   11/12/23 1337  Weight: 115 lb (52.2 kg)  Height: 5' 4 (1.626 m)  PainSc: 0-No pain   Body mass index is 19.74 kg/m.     11/12/2023    1:39 PM 03/09/2023    2:26 PM 04/28/2022   11:15 AM 01/03/2022    2:14 PM 09/05/2021    1:41 PM 12/03/2020   11:00 AM 06/04/2020    1:47 PM  Advanced Directives  Does Patient Have a Medical Advance Directive? No No No No No No No  Would patient like information on creating a medical advance directive? No - Patient declined No - Patient declined No - Patient declined No - Patient declined No - Patient declined No - Patient declined No - Patient declined    Current Medications (verified) Outpatient Encounter Medications as of 11/12/2023  Medication Sig   ARIPiprazole  ER (ABILIFY  MAINTENA) 300 MG PRSY prefilled syringe Inject 300 mg into the muscle every 28 (twenty-eight) days.   Facility-Administered Encounter Medications as of 11/12/2023  Medication   ARIPiprazole  ER (ABILIFY  MAINTENA) 300 MG prefilled syringe 300 mg    Allergies (verified) Patient has no known allergies.   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 No pertinent surgical history. Family History  Problem Relation Age of Onset   Healthy Mother    Healthy Father    Social History   Socioeconomic History   Marital status: Single    Spouse name: Not on file   Number of  children: 0   Years of education: 12   Highest education level: High school graduate  Occupational History   Occupation: Health and safety inspector: [employer]    Comment: Studies Primary school teacher  Tobacco Use   Smoking status: Every Day    Current packs/day: 0.10    Average packs/day: 0.1 packs/day for 9.8 years (1.0 ttl pk-yrs)    Types: Cigarettes, Cigars    Start date: 2016   Smokeless tobacco: Never  Vaping Use   Vaping status: Not on  file  Substance and Sexual Activity   Alcohol use: No   Drug use: Not Currently    Types: Marijuana   Sexual activity: Not Currently    Partners: Female    Birth control/protection: Condom  Other Topics Concern   Not on file  Social History Scientist, 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   Social Drivers of Health   Financial Resource Strain: Low Risk  (11/12/2023)   Overall Financial Resource Strain (CARDIA)    Difficulty of Paying Living Expenses: Not hard at all  Food Insecurity: No Food Insecurity (11/12/2023)   Hunger Vital Sign    Worried About Running Out of Food in the Last Year: Never true    Ran Out of Food in the Last Year: Never true  Transportation Needs: No Transportation Needs (11/12/2023)   PRAPARE - Administrator, Civil Service (Medical): No    Lack of Transportation (Non-Medical): No  Physical Activity: Sufficiently Active (11/12/2023)   Exercise Vital Sign    Days of Exercise per Week: 5 days    Minutes of Exercise per Session: 30 min  Stress: No Stress Concern Present (11/12/2023)   [employer] of Occupational Health - Occupational Stress Questionnaire    Feeling of Stress: Not at all  Social Connections: Socially Isolated (11/12/2023)   Social Connection and Isolation Panel    Frequency of Communication with Friends and Family: Twice a week    Frequency of Social Gatherings with Friends and Family: Once a week    Attends Religious Services: Never    Database administrator or Organizations: No    Attends Engineer, structural: Never    Marital Status: Never married    Tobacco Counseling Ready to quit: Not Answered Counseling given: Not Answered    Clinical Intake:  Pre-visit preparation completed: Yes  Pain : No/denies pain Pain Score: 0-No pain     BMI - recorded: 19.74 Nutritional Status: BMI of 19-24  Normal Nutritional Risks: None Diabetes: No  Lab Results  Component Value Date   HGBA1C 5.6 02/12/2023    HGBA1C 5.5 04/28/2022   HGBA1C 6.1 (H) 07/16/2019     How often do you need to have someone help you when you read instructions, pamphlets, or other written materials from your doctor or pharmacy?: 1 - Never What is the last grade level you completed in school?: HSG  Interpreter Needed?: No  Information entered by :: Cherish Runde N. Kyley Laurel, LPN.   Activities of Daily Living     11/12/2023    1:41 PM  In your present state of health, do you have any difficulty performing the following activities:  Hearing? 0  Vision? 0  Difficulty concentrating or making decisions? 0  Walking or climbing stairs? 0  Dressing or bathing? 0  Doing errands, shopping? 0  Preparing Food and eating ? N  Using the Toilet? N  In the past six months, have you accidently leaked urine?  N  Do you have problems with loss of bowel control? N  Managing your Medications? N  Managing your Finances? N  Housekeeping or managing your Housekeeping? N    Patient Care Team: Theophilus Pagan, MD as PCP - General (Family Medicine)  I have updated your Care Teams any recent Medical Services you may have received from other providers in the past year.     Assessment:   This is a routine wellness examination for Dustin Tate.  Hearing/Vision screen Hearing Screening - Comments:: Patient has adequate hearing. Vision Screening - Comments:: Patient has adequate vision with rx eyeglasses.  Patient stated he is up to date with annual eye exam.   Goals Addressed               This Visit's Progress     11/12/2023: (pt-stated)        To maintain my health, keep walking for exercise and enjoy life.       Depression Screen     11/12/2023    1:39 PM 03/09/2023    2:24 PM 08/21/2022   12:32 PM 04/28/2022   11:14 AM 04/28/2022   10:20 AM 02/11/2022   10:27 AM 01/03/2022    2:14 PM  PHQ 2/9 Scores  PHQ - 2 Score 0 2  0   1  PHQ- 9 Score 0 3  4   1   Exception Documentation            Information is confidential and  restricted. Go to Review Flowsheets to unlock data.    Fall Risk     11/12/2023    1:39 PM 12/03/2020   11:00 AM  Fall Risk   Falls in the past year? 0 0  Number falls in past yr: 0 0  Injury with Fall? 0 0  Risk for fall due to : No Fall Risks   Follow up Falls evaluation completed     MEDICARE RISK AT HOME:  Medicare Risk at Home Any stairs in or around the home?: Yes If so, are there any without handrails?: No Home free of loose throw rugs in walkways, pet beds, electrical cords, etc?: Yes Adequate lighting in your home to reduce risk of falls?: Yes Life alert?: No Use of a cane, walker or w/c?: No Grab bars in the bathroom?: No Shower chair or bench in shower?: No Elevated toilet seat or a handicapped toilet?: No  TIMED UP AND GO:  Was the test performed?  No  Cognitive Function: 6CIT completed    11/12/2023    1:40 PM  MMSE - Mini Mental State Exam  Not completed: Unable to complete        11/12/2023    1:41 PM  6CIT Screen  What Year? 0 points  What month? 0 points  What time? 0 points  Count back from 20 0 points  Months in reverse 0 points  Repeat phrase 0 points  Total Score 0 points    Immunizations Immunization History  Administered Date(s) Administered   HPV 9-valent 09/05/2021, 01/03/2022, 03/09/2023   Influenza, Seasonal, Injecte, Preservative Fre 11/13/2022   Influenza,inj,Quad PF,6+ Mos 12/03/2020, 01/03/2022   PFIZER(Purple Top)SARS-COV-2 Vaccination 05/12/2019, 06/08/2019, 01/21/2020   PNEUMOCOCCAL CONJUGATE-20 03/09/2023   Pfizer(Comirnaty)Fall Seasonal Vaccine 12 years and older 01/29/2022, 03/09/2023   Tdap 08/20/2017    Screening Tests Health Maintenance  Topic Date Due   Hepatitis B Vaccines 19-59 Average Risk (1 of 3 - 19+ 3-dose series) Never done   Influenza Vaccine  09/04/2023  COVID-19 Vaccine (6 - 2025-26 season) 10/05/2023   Medicare Annual Wellness (AWV)  11/11/2024   DTaP/Tdap/Td (2 - Td or Tdap) 08/21/2027    Pneumococcal Vaccine  Completed   HPV VACCINES  Completed   Hepatitis C Screening  Completed   HIV Screening  Completed   Meningococcal B Vaccine  Aged Out    Health Maintenance Items Addressed: Vaccines Due: Flu, Covid-19 and Hepatitis B.  Additional Screening:  Vision Screening: Recommended annual ophthalmology exams for early detection of glaucoma and other disorders of the eye. Is the patient up to date with their annual eye exam?  Yes  Who is the provider or what is the name of the office in which the patient attends annual eye exams? Patient could not remember the name.  Dental Screening: Recommended annual dental exams for proper oral hygiene  Community Resource Referral / Chronic Care Management: CRR required this visit?  No   CCM required this visit?  No   Plan:    I have personally reviewed and noted the following in the patient's chart:   Medical and social history Use of alcohol, tobacco or illicit drugs  Current medications and supplements including opioid prescriptions. Patient is not currently taking opioid prescriptions. Functional ability and status Nutritional status Physical activity Advanced directives List of other physicians Hospitalizations, surgeries, and ER visits in previous 12 months Vitals Screenings to include cognitive, depression, and falls Referrals and appointments  In addition, I have reviewed and discussed with patient certain preventive protocols, quality metrics, and best practice recommendations. A written personalized care plan for preventive services as well as general preventive health recommendations were provided to patient.   Roz LOISE Fuller, LPN   [id number]/[id number]   After Visit Summary: (Declined) Due to this being a telephonic visit, with patients personalized plan was offered to patient but patient Declined AVS at this time   Notes: Nothing significant to report at this time.

## 2023-11-16 ENCOUNTER — Ambulatory Visit: Admitting: *Deleted

## 2023-11-16 DIAGNOSIS — Z23 Encounter for immunization: Secondary | ICD-10-CM | POA: Diagnosis present

## 2023-11-16 NOTE — Progress Notes (Unsigned)
 Pt presents for Flu / Covid vaccines (too early for physical as originally scheduled).  Given and patient tolerated well. Harlene Carte, CMA

## 2023-11-16 NOTE — Progress Notes (Deleted)
    SUBJECTIVE:   CHIEF COMPLAINT / HPI:   Here for checkup ***  PERTINENT  PMH / PSH: ***  OBJECTIVE:   There were no vitals taken for this visit.  ***  ASSESSMENT/PLAN:   Assessment & Plan    Edison Gore, MD [facility]

## 2023-11-24 ENCOUNTER — Ambulatory Visit (INDEPENDENT_AMBULATORY_CARE_PROVIDER_SITE_OTHER): Admitting: Student in an Organized Health Care Education/Training Program

## 2023-11-24 DIAGNOSIS — F17200 Nicotine dependence, unspecified, uncomplicated: Secondary | ICD-10-CM

## 2023-11-24 DIAGNOSIS — F19951 Other psychoactive substance use, unspecified with psychoactive substance-induced psychotic disorder with hallucinations: Secondary | ICD-10-CM

## 2023-11-24 DIAGNOSIS — Z87898 Personal history of other specified conditions: Secondary | ICD-10-CM

## 2023-11-24 MED ORDER — MELATONIN 5 MG PO CAPS: 5.0000 mg | ORAL_CAPSULE | Freq: Every evening | ORAL | Status: AC

## 2023-11-24 NOTE — Patient Instructions (Addendum)
 [facility] Carlette Simons [address],[place],[place]  [phone] [email or web address]  Dear Jontez,  Most effective treatment for your mental health disease involves BOTH a psychiatrist AND a therapist Psychiatrist to manage medications Therapist to help identify personal goals, barriers from those goals, and plan to achieve those goals by understanding emotions Please make regular appointments with an outpatient psychiatrist and other doctors once you leave the hospital (if any, otherwise, please see below for resources to make an appointment).  For therapy outside the hospital, please ask for these specific types of therapy: DBT ________________________________________________________  SAFETY CRISIS  Dial 988 for National Suicide & Crisis Lifeline    Text [phone] for Crisis Text Line:     [facility]ENT [facility]:  [address] FIRST FLOOR.  [place], [place] [address].  [phone]  Mobile Crisis Response Teams Listed by counties in vicinity of [facility] providers [facility] [phone] [facility] [phone] [facility] [phone] [facility] [phone] [place]                * [facility] [phone]                * [facility] [phone] [facility] [phone] [facility]  [phone] * [facility] [phone] ________________________________________________________  To see which pharmacy near you is the CHEAPEST for certain medications, please use GoodRx. It is free website and has a free phone app.    Also consider looking at [facility] $4.00 or [facility]'s $7.00 prescription list. Both are free to view if googled [facility]4 prescription and [facility]'s $7  prescription. These are set prices, no insurance required. [facility] low cost medications: $4-$15 for 30days prescriptions or $10-$38 for 90days prescriptions  ________________________________________________________  Difficulties with sleep?   Can also use this free app for insomnia called CBT-I. Let your doctors and therapists know so they can help with extra tips and tricks or for guidance and accountability. NO ADDS on the app.     ________________________________________________________  Non-Emergent / Urgent  [facility] [address], SECOND FLOOR [place], [place] [address] [phone] OUTPATIENT Walk-in information: Please note, all walk-ins are first come & first serve, with limited number of availability.  Please note that to be eligible for services you must bring: ID or a piece of mail with your name [facility] address  Therapist for therapy:  Monday & Wednesdays: Please ARRIVE at 7:15 AM for registration Will START at 8:00 AM Every 1st & 2nd Friday of the month: Please ARRIVE at 10:15 AM for registration Will START at 1 PM - 5 PM  Psychiatrist for medication management: Monday - Friday:  Please ARRIVE at 7:15 AM for registration Will START at 8:00 AM  Regretfully, due to limited availability, please be aware that you may not been seen on the same day as walk-in. Please consider making an appoint or try again. Thank you for your patience and understanding.

## 2023-11-24 NOTE — Progress Notes (Signed)
 BH MD Outpatient Progress Note  11/24/2023 6:52 PM Dustin Tate  MRN:  [id number]  Assessment:  Dustin Tate presents for follow-up evaluation in-person on 11/24/23 .   At today's appointment, the patient is accompanied by their mother. Since the last visit, the patient continues to benefit from the current psychotropic regimen. Negative symptoms continue to be present and patient has remained socially withdrawn. . There is no evidence of symptom exacerbation or acute safety concerns. Mental status examination remains stable compared to prior assessments.  The patient's mother is actively seeking a new therapist. Continued recommendations for structured day programming, and resources for [facility] in [place] were provided. No changes are indicated to the current medication regimen; continuation at the present dose remains appropriate given ongoing clinical stability.  The patient sleep has been poor, discussed CBT-I strategies and OTC melatonin at this visit.   Identifying Information: Dustin Tate is a 28 y.o. male with a history of marijuana abuse, nicotine  use disorder, substance induced psychosis, hx of learning disability, depression, SI/SA  who is an established patient with [facility] for management of medications and mood. He is formerly a patient of Dr. Rainelle. For a comprehensive history and detailed assessment, please refer to the initial adult assessment.   Plan:  # Substance induced psychosis #History of learning disability  Consider IDD and ASD Past medication trials:  Status of problem: stable Interventions: -- Continue Abilify  maintenna 300-mg q28days ( last given 11/12/2023 per chart review) -- For antipsychotic safety monitoring, patient's labs all WNL on 02/2023.  EKG also showing NSS with QTc of 445 -- The patient's mother is currently seeking new therapist for patient -- Continue to recommend day programs,  resources provided at this visit.   #Sleeping difficulties -- OTC melatonin -- CBT-i  # Nicotine  Use Disorder, improved  Past medication trials:  Status of problem: active, unchanged form prior Interventions: -- Discussed smoking cessation; continues smoking black and milds  -- Declined NRT   Patient was given contact information for behavioral health clinic and was instructed to call 911 for emergencies.   Subjective:  Chief Complaint:  Chief Complaint  Patient presents with   Medication Refill   Follow-up    Interval History:  Patient remains socially withdrawn with little interest in leaving home. The [facility] was recommended, and the mother is uncertain if this is the previously suggested program but plans to contact them.  Patient denies symptoms of depression and anxiety. She reports no adverse effects to medications and states she is compliant with her medication regimen, taking doses as directed. Regarding sleep, she reports obtaining 6-7 hours per night, compared to a previous baseline of 12 hours. She describes frequent nighttime awakenings, approximately 3-4 times per night, without nightmares, and notes it takes her about 3 hours to fall asleep. Cognitive behavioral therapy for insomnia was discussed. She denies suicidal ideation, homicidal ideation, and auditory or visual hallucinations.  Visit Diagnosis:    ICD-10-CM   1. 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     2. History of learning disability  Z87.898     3. Nicotine  dependence, uncomplicated, unspecified nicotine  product type  F17.200        Past Psychiatric History: Inpatient: Brief psychotic disorder-hospitalized at [facility] 07/2019 approximately 9 days today, only inpatient hospitalization Discharged on Zoloft  50 mg and Seroquel  500 mg nightly Outpatient: At the [facility] behavioral health clinic Therapy hx: Patient had in-home  therapy from ages  [age] Patient had an IEP from kindergarten through 12th grade Patient was born 2 weeks premature  Visit hx--  06/23/2023 -- planned for transition from PO abilify  to abilify  maintenna,  first injection given on 06/24/2023  03/06/2022-patient was in the car with his parents.  Patient was very distracted it was difficult to get a full assessment in patient's.  Did speak to mom there was a lot of concern that patient Seroquel  500 mg is not beneficial as he was having a lot of AVH in responding to internal stimuli.  Medication adjustments were made to titrate patient off of Seroquel  and titrated onto Abilify  up to 15 mg.   04/2022- patient appears to be doing fairly well on dual Seroquel  and Abilify  however, as patient is on a high dose of Seroquel , would like to titrate patient off with the goal of being on an antipsychotic monotherapy due to increased risk associated with both medications. being on both medications he has had significant decrease in his auditory hallucinations and responding to internal stimuli.   Social History   Socioeconomic History   Marital status: Single    Spouse name: Not on file   Number of children: 0   Years of education: 12   Highest education level: High school graduate  Occupational History   Occupation: Health and safety inspector: [employer]    Comment: Studies Primary school teacher  Tobacco Use   Smoking status: Every Day    Current packs/day: 0.10    Average packs/day: 0.1 packs/day for 9.8 years (1.0 ttl pk-yrs)    Types: Cigarettes, Cigars    Start date: 2016   Smokeless tobacco: Never  Vaping Use   Vaping status: Not on file  Substance and Sexual Activity   Alcohol use: No   Drug use: Not Currently    Types: Marijuana   Sexual activity: Not Currently    Partners: Female    Birth control/protection: Condom  Other Topics Concern   Not on file  Social History Scientist, 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   Social Drivers of Health   Financial Resource  Strain: Low Risk  (11/12/2023)   Overall Financial Resource Strain (CARDIA)    Difficulty of Paying Living Expenses: Not hard at all  Food Insecurity: No Food Insecurity (11/12/2023)   Hunger Vital Sign    Worried About Running Out of Food in the Last Year: Never true    Ran Out of Food in the Last Year: Never true  Transportation Needs: No Transportation Needs (11/12/2023)   PRAPARE - Administrator, Civil Service (Medical): No    Lack of Transportation (Non-Medical): No  Physical Activity: Sufficiently Active (11/12/2023)   Exercise Vital Sign    Days of Exercise per Week: 5 days    Minutes of Exercise per Session: 30 min  Stress: No Stress Concern Present (11/12/2023)   [employer] of Occupational Health - Occupational Stress Questionnaire    Feeling of Stress: Not at all  Social Connections: Socially Isolated (11/12/2023)   Social Connection and Isolation Panel    Frequency of Communication with Friends and Family: Twice a week    Frequency of Social Gatherings with Friends and Family: Once a week    Attends Religious Services: Never    Database administrator or Organizations: No    Attends Banker Meetings: Never    Marital Status: Never married    Allergies: No Known Allergies  Current  Medications: Current Outpatient Medications  Medication Sig Dispense Refill   Melatonin 5 MG CAPS Take 1 capsule (5 mg total) by mouth at bedtime.     ARIPiprazole  ER (ABILIFY  MAINTENA) 300 MG PRSY prefilled syringe Inject 300 mg into the muscle every 28 (twenty-eight) days. 1 each 11   Current Facility-Administered Medications  Medication Dose Route Frequency Provider Last Rate Last Admin   ARIPiprazole  ER (ABILIFY  MAINTENA) 300 MG prefilled syringe 300 mg  300 mg Intramuscular Q28 days    300 mg at 11/12/23 0908    ROS: Review of Systems  All other systems reviewed and are negative.   Objective:  Objective: Psychiatric Specialty Exam: General Appearance:  well groomed, meticulous  Eye Contact:  Good    Speech:  Clear, coherent, decreased rate; paucity of speech  Volume:  Decreased  Mood:  see above  Affect:  Flat  Thought Content: limited  Suicidal Thoughts: see subjective  Thought Process:  Coherent  Orientation:  A&Ox4   Memory:  Immediate good  Judgment:  Fair  Insight:  Poor  Concentration:  Attention and concentration good   Recall:  [employer] of Knowledge: Fair  Language: Good, fluent  Psychomotor Activity: Normal  Akathisia:  NA   AIMS (if indicated): NA   Assets:   Housing Social Support  ADL's:  Intact  Cognition: WNL  Sleep: see above  Appetite: see above    Physical Exam Vitals reviewed.  HENT:     Head: Normocephalic and atraumatic.  Eyes:     Extraocular Movements: Extraocular movements intact.     Conjunctiva/sclera: Conjunctivae normal.  Pulmonary:     Effort: Pulmonary effort is normal. No respiratory distress.  Musculoskeletal:        General: Normal range of motion.  Neurological:     General: No focal deficit present.      Metabolic Disorder Labs: Lab Results  Component Value Date   HGBA1C 5.6 02/12/2023   MPG 128.37 07/16/2019   Lab Results  Component Value Date   PROLACTIN 8.6 07/16/2019   Lab Results  Component Value Date   CHOL 182 02/12/2023   TRIG 73 02/12/2023   HDL 42 02/12/2023   CHOLHDL 4.3 02/12/2023   VLDL 8 07/16/2019   LDLCALC 126 (H) 02/12/2023   LDLCALC 89 04/28/2022   Lab Results  Component Value Date   TSH 1.090 02/12/2023   TSH 1.050 04/28/2022    Therapeutic Level Labs: No results found for: LITHIUM No results found for: VALPROATE No results found for: CBMZ  Screenings:  AIMS    Flowsheet Row Clinical Support from 04/25/2022 in [facility]  AIMS Total Score 0   AUDIT    Flowsheet Row [facility]  Alcohol Use Disorder Identification Test  Final Score (AUDIT) 0   GAD-7    Flowsheet Row Counselor from 08/05/2022 in [facility] Counselor from 04/28/2022 in [facility] Counselor from 02/11/2022 in [facility] Video Visit from 01/29/2021 in [facility] Clinical Support from 08/15/2020 in [facility]  Total GAD-7 Score 0 1 6 12 6    PHQ2-9    Flowsheet Row Clinical Support from 11/12/2023 in [facility] [place]. [facility] Most recent reading at 11/12/2023  1:39 PM Office Visit from 03/09/2023 in [facility]pt Of [place]. [facility] Most recent reading at 03/09/2023  2:24 PM Office Visit from 08/21/2022 in [facility] Most recent reading at 08/21/2022 12:32 PM Office Visit from 04/28/2022 in [facility] Of [place]. [facility] Most recent reading at 04/28/2022 11:14 AM Counselor from 04/28/2022 in [facility] Most recent reading at 04/28/2022 10:20 AM  PHQ-2 Total Score 0 2 0 0 0  PHQ-9 Total Score 0 3 -- 4 0   Flowsheet Row UC from 07/22/2020 in [facility] Clinical Support from 04/04/2020 in [facility]  C-SSRS RISK CATEGORY Error: Question 6 not populated No Risk    Collaboration of Care:   Patient/Guardian was advised Release of Information must be obtained prior to any record release in order to collaborate their care with an outside provider. Patient/Guardian was advised if they have not already done so to contact the registration department to sign all necessary forms in order for us  to release information regarding their care.   Consent: Patient/Guardian gives verbal consent for treatment and assignment of benefits for services provided during this visit. Patient/Guardian expressed  understanding and agreed to proceed.    Marlo Masson, MD 11/24/2023, 6:52 PM

## 2023-12-10 ENCOUNTER — Encounter (HOSPITAL_COMMUNITY): Payer: Self-pay

## 2023-12-10 ENCOUNTER — Ambulatory Visit (INDEPENDENT_AMBULATORY_CARE_PROVIDER_SITE_OTHER)

## 2023-12-10 VITALS — BP 127/72 | HR 99 | Ht 64.0 in | Wt 119.8 lb

## 2023-12-10 DIAGNOSIS — F411 Generalized anxiety disorder: Secondary | ICD-10-CM

## 2023-12-10 DIAGNOSIS — F3341 Major depressive disorder, recurrent, in partial remission: Secondary | ICD-10-CM | POA: Diagnosis not present

## 2023-12-10 DIAGNOSIS — F200 Paranoid schizophrenia: Secondary | ICD-10-CM

## 2023-12-10 NOTE — Progress Notes (Cosign Needed)
 Pt presents today for injection of Abilify  maintenna 300-mg 1.5 ml. Pt presents to be well groomed and taken care of. This was given in patients LEFT-DELTIOD. Pt states that he denies all AVH, SI, and H I. Patient states that he is doing well overall and does not want to make any changes to medications. Patient will be returning in 28 days.  JNL,CMA

## 2024-01-05 ENCOUNTER — Encounter (HOSPITAL_COMMUNITY): Payer: Self-pay | Admitting: Student in an Organized Health Care Education/Training Program

## 2024-01-05 ENCOUNTER — Ambulatory Visit (HOSPITAL_COMMUNITY): Admitting: Student in an Organized Health Care Education/Training Program

## 2024-01-05 VITALS — BP 128/79 | HR 84 | Ht 64.0 in | Wt 118.0 lb

## 2024-01-05 DIAGNOSIS — Z79899 Other long term (current) drug therapy: Secondary | ICD-10-CM

## 2024-01-05 NOTE — Progress Notes (Signed)
 BH MD Outpatient Progress Note  01/05/2024 10:40 AM Dustin Tate  MRN:  [id number]  Assessment:  Dustin Tate presents for follow-up evaluation in-person on 01/05/24 . Medical Student, Nadia Santoro, was present and completed portions of the history and assessment.   Today, the patient and mother reports significant improvement/stability in patient's mood since starting LAI. From previous reporting, the auditory hallucinations have subsided. Since the last visit, the patient continues to benefit from the current psychotropic regimen. Negative symptoms continue to be present and patient has remained socially withdrawn. There is no evidence of symptom exacerbation or acute safety concerns. Mental status examination remains stable compared to prior assessments.  The patient and mother have recently started with new therapist at [facility]. Continued recommendations for structured day programming, and resources for [facility] in [place] were provided. No changes are indicated to the current medication regimen; continuation at the present dose remains appropriate given ongoing clinical stability.   Will have to continue to explore whether his psychotic symptoms are solely substance-induced or if there is an element of underlying schizophrenia spectrum disorder.  Either way, ensuring that he has an antipsychotic agent on board that has proven beneficial to him, especially LAI.   Continue to titrate LAI as clinically required.  Identifying Information: Dustin Tate is a 28 y.o. male with a history of marijuana abuse, nicotine  use disorder, substance induced psychosis, hx of learning disability, depression, SI/SA  who is an established patient with [facility] for management of medications and mood. He is formerly a patient of Dr. Rainelle. For a comprehensive history and detailed assessment, please refer to the initial adult  assessment.   Plan:  # Substance induced psychosis #History of learning disability  Consider IDD and ASD Past medication trials:  Status of problem: stable Interventions: -- Continue Abilify  maintenna 300-mg q28days ( last given 12/10/2023 per chart review) -- Monitoring Labs re-ordered on 01/05/2024: fasting A1C, Lipid Panel, EKG -- Established with new therapist through [facility] -- Continue to recommend day programs, resources provided at this visit.  -- Consider formal neuropsychological testing in future visit.  #Sleeping difficulties -- OTC melatonin -- CBT-i  # Nicotine  Use Disorder, improved  Past medication trials:  Status of problem: active, unchanged form prior Interventions: -- Discussed smoking cessation; continues smoking black and milds  -- Declined NRT   Patient was given contact information for behavioral health clinic and was instructed to call 911 for emergencies.   Subjective:  Chief Complaint:  Chief Complaint  Patient presents with   Follow-up    Interval History:  Patient reports things are going well since last visit. He is getting sufficient sleep, and feels well rested. He reports that his mood is okay.  He denies noted side effects from LAI.   Patient denies acute concerns or complaints today.  Objectively, patient is cooperative in conversation but appears withdrawn. No evidence of the patient responding to internal stimuli. Patient reports no auditory hallucinations.   Sleep - 6-7 hrs Appetite - 2 meals per day Denies SI, HI, VH.   Denies vaping. Endorses Black and Milds use daily - last used yesterday Alcohol Denies. Denies cannabis use. Denies other substance use (stimulants, opioids)   He and mom both report that he has been more active and have improved mood. in The patient reports engaging in hygienic practices on a more consistent basis, and more energetic throughout the day. Mom reports that he  is  doing well, eating and sleeping well. Mom has not noticed pt responding to internal stimuli.   During the patient's previous visit, the patient and mother reported significant improvement since starting LAI and objective improvements observed in mood as well (more range of affect and more engagement in conversation). Patient reported auditory hallucinations are still present, but mom denies witnessing him responding to internal stimuli.  From previous reporting, the hallucinations have subsided.  Patient is currently working with new therapist. Mother reports they are in the early/introductory phase. Patient and mother reports its going well.   AEs to medications: Medication compliance (missing doses, taking as directed): endorses Sleep: 6-8/night  Appetite: 2 meals per day, no reported weight loss Caffeine: denies Recent substance use: black and mild daily SI: denies Impact on functioning: stable compared to previous visits    Visit Diagnosis:    ICD-10-CM   1. Long-term use of high-risk medication  Z79.899      Past Psychiatric History: Inpatient: Brief psychotic disorder-hospitalized at [facility] 07/2019 approximately 9 days today, only inpatient hospitalization Discharged on Zoloft  50 mg and Seroquel  500 mg nightly Outpatient: At the [facility] behavioral health clinic Therapy hx: Patient had in-home therapy from ages [age] Patient had an IEP from kindergarten through 12th grade Patient was born 2 weeks premature  Visit hx--  01/06/2024 - has continued to tolerate abilify  maintenna, no changes   06/23/2023 -- planned for transition from PO abilify  to abilify  maintenna,  first injection given on 06/24/2023  03/06/2022-patient was in the car with his parents.  Patient was very distracted it was difficult to get a full assessment in patient's.  Did speak to mom there was a lot of concern that patient Seroquel  500 mg is not beneficial as he was having a lot of AVH in responding to internal  stimuli.  Medication adjustments were made to titrate patient off of Seroquel  and titrated onto Abilify  up to 15 mg.   04/2022- patient appears to be doing fairly well on dual Seroquel  and Abilify  however, as patient is on a high dose of Seroquel , would like to titrate patient off with the goal of being on an antipsychotic monotherapy due to increased risk associated with both medications. being on both medications he has had significant decrease in his auditory hallucinations and responding to internal stimuli.   Social History   Socioeconomic History   Marital status: Single    Spouse name: Not on file   Number of children: 0   Years of education: 12   Highest education level: High school graduate  Occupational History   Occupation: Health And Safety Inspector: [employer]    Comment: Studies primary school teacher  Tobacco Use   Smoking status: Every Day    Current packs/day: 0.10    Average packs/day: 0.1 packs/day for 9.9 years (1.0 ttl pk-yrs)    Types: Cigarettes, Cigars    Start date: 2016   Smokeless tobacco: Never  Vaping Use   Vaping status: Not on file  Substance and Sexual Activity   Alcohol use: No   Drug use: Not Currently    Types: Marijuana   Sexual activity: Not Currently    Partners: Female    Birth control/protection: Condom  Other Topics Concern   Not on file  Social History Scientist, 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   Social Drivers of Health   Financial Resource Strain: Low Risk  (11/12/2023)   Overall Financial Resource Strain (CARDIA)  Difficulty of Paying Living Expenses: Not hard at all  Food Insecurity: No Food Insecurity (11/12/2023)   Hunger Vital Sign    Worried About Running Out of Food in the Last Year: Never true    Ran Out of Food in the Last Year: Never true  Transportation Needs: No Transportation Needs (11/12/2023)   PRAPARE - Administrator, Civil Service (Medical): No    Lack of Transportation (Non-Medical): No  Physical Activity:  Sufficiently Active (11/12/2023)   Exercise Vital Sign    Days of Exercise per Week: 5 days    Minutes of Exercise per Session: 30 min  Stress: No Stress Concern Present (11/12/2023)   [employer] of Occupational Health - Occupational Stress Questionnaire    Feeling of Stress: Not at all  Social Connections: Socially Isolated (11/12/2023)   Social Connection and Isolation Panel    Frequency of Communication with Friends and Family: Twice a week    Frequency of Social Gatherings with Friends and Family: Once a week    Attends Religious Services: Never    Database Administrator or Organizations: No    Attends Engineer, Structural: Never    Marital Status: Never married    Allergies: No Known Allergies  Current Medications: Current Outpatient Medications  Medication Sig Dispense Refill   ARIPiprazole  ER (ABILIFY  MAINTENA) 300 MG PRSY prefilled syringe Inject 300 mg into the muscle every 28 (twenty-eight) days. 1 each 11   Melatonin 5 MG CAPS Take 1 capsule (5 mg total) by mouth at bedtime.     Current Facility-Administered Medications  Medication Dose Route Frequency Provider Last Rate Last Admin   ARIPiprazole  ER (ABILIFY  MAINTENA) 300 MG prefilled syringe 300 mg  300 mg Intramuscular Q28 days    300 mg at 12/10/23 0942    ROS: Review of Systems  All other systems reviewed and are negative.   Objective:  Objective: Psychiatric Specialty Exam: General Appearance: well groomed, meticulous  Eye Contact:  Good    Speech:  Clear, coherent, decreased rate; paucity of speech  Volume:  Decreased  Mood:  see above  Affect:  Flat  Thought Content: limited  Suicidal Thoughts: see subjective  Thought Process:  Coherent  Orientation:  A&Ox4   Memory:  Immediate good  Judgment:  Fair  Insight:  Poor  Concentration:  Attention and concentration good   Recall:  [employer] of Knowledge: Fair  Language: Good, fluent  Psychomotor Activity: Normal  Akathisia:  NA    AIMS (if indicated): NA   Assets:   Housing Social Support  ADL's:  Intact  Cognition: WNL  Sleep: see above  Appetite: see above    Physical Exam Vitals reviewed.  HENT:     Head: Normocephalic and atraumatic.  Eyes:     Extraocular Movements: Extraocular movements intact.     Conjunctiva/sclera: Conjunctivae normal.  Pulmonary:     Effort: Pulmonary effort is normal. No respiratory distress.  Musculoskeletal:        General: Normal range of motion.  Neurological:     General: No focal deficit present.      Metabolic Disorder Labs: Lab Results  Component Value Date   HGBA1C 5.6 02/12/2023   MPG 128.37 07/16/2019   Lab Results  Component Value Date   PROLACTIN 8.6 07/16/2019   Lab Results  Component Value Date   CHOL 182 02/12/2023   TRIG 73 02/12/2023   HDL 42 02/12/2023   CHOLHDL 4.3 02/12/2023  VLDL 8 07/16/2019   LDLCALC 126 (H) 02/12/2023   LDLCALC 89 04/28/2022   Lab Results  Component Value Date   TSH 1.090 02/12/2023   TSH 1.050 04/28/2022    Therapeutic Level Labs: No results found for: LITHIUM No results found for: VALPROATE No results found for: CBMZ  Screenings:  AIMS    Flowsheet Row Clinical Support from 04/25/2022 in [facility]  AIMS Total Score 0   AUDIT    Flowsheet Row [facility]  Alcohol Use Disorder Identification Test Final Score (AUDIT) 0   GAD-7    Flowsheet Row Counselor from 08/05/2022 in [facility] Counselor from 04/28/2022 in [facility] Counselor from 02/11/2022 in [facility] Video Visit from 01/29/2021 in [facility] Clinical Support from 08/15/2020 in [facility]  Total GAD-7 Score 0 1 6 12 6    PHQ2-9    Flowsheet Row Clinical Support from 11/12/2023 in [facility] Of [place]. [facility] Most recent reading at 11/12/2023  1:39 PM Office Visit from 03/09/2023 in [facility] Of [place]. [facility] Most recent reading at 03/09/2023  2:24 PM Office Visit from 08/21/2022 in [facility] Most recent reading at 08/21/2022 12:32 PM Office Visit from 04/28/2022 in [facility] [place]. [facility] Most recent reading at 04/28/2022 11:14 AM Counselor from 04/28/2022 in [facility] Most recent reading at 04/28/2022 10:20 AM  PHQ-2 Total Score 0 2 0 0 0  PHQ-9 Total Score 0 3 -- 4 0   Flowsheet Row UC from 07/22/2020 in [facility] Clinical Support from 04/04/2020 in [facility]  C-SSRS RISK CATEGORY Error: Question 6 not populated No Risk    Collaboration of Care:   Patient/Guardian was advised Release of Information must be obtained prior to any record release in order to collaborate their care with an outside provider. Patient/Guardian was advised if they have not already done so to contact the registration department to sign all necessary forms in order for us  to release information regarding their care.   Consent: Patient/Guardian gives verbal consent for treatment and assignment of benefits for services provided during this visit. Patient/Guardian expressed understanding and agreed to proceed.   _____________________________________________________________________________________________ I personally was present and performed or re-performed the history, physical exam and medical decision-making activities of this service and have verified that the service and findings are accurately documented in the student's note.   Inocente Dibble Medical Student  Marlo Masson, MD 01/05/2024, 10:40 AM

## 2024-01-05 NOTE — Patient Instructions (Addendum)
 Dear Teresa,   Thanks for coming to your appointment today. Your next follow up appointment will be Tuesday, Feb 3rd at 10:30AM. Happy Holidays!  ________________________________________________________  SAFETY CRISIS  Dial 988 for National Suicide & Crisis Lifeline    Text [phone] for Crisis Text Line:     [facility]:  [address] FIRST FLOOR.  [place], [place] [address].  [phone]  Mobile Crisis Response Teams Listed by counties in vicinity of [facility] providers [facility] [phone] [facility] [phone] [facility] [phone] [facility] [phone] [place]                * [facility] [phone]                * [facility] [phone] [facility] [phone] [facility]  [phone] * [facility] [phone] ________________________________________________________  To see which pharmacy near you is the CHEAPEST for certain medications, please use GoodRx. It is free website and has a free phone app.    Also consider looking at [facility] $4.00 or [facility]'s $7.00 prescription list. Both are free to view if googled [facility]4 prescription and [facility]'s $7 prescription. These are set prices, no insurance required. [facility] low cost medications: $4-$15 for 30days prescriptions or $10-$38 for 90days prescriptions  ________________________________________________________  Difficulties with sleep?   Can also use this free app for insomnia called CBT-I. Let your doctors and therapists know so they can help with extra tips and tricks or for guidance and accountability. NO ADDS on the app.

## 2024-01-06 ENCOUNTER — Ambulatory Visit (HOSPITAL_COMMUNITY)

## 2024-01-06 ENCOUNTER — Other Ambulatory Visit (INDEPENDENT_AMBULATORY_CARE_PROVIDER_SITE_OTHER)

## 2024-01-06 ENCOUNTER — Encounter (HOSPITAL_COMMUNITY): Payer: Self-pay

## 2024-01-06 DIAGNOSIS — F1994 Other psychoactive substance use, unspecified with psychoactive substance-induced mood disorder: Secondary | ICD-10-CM

## 2024-01-06 DIAGNOSIS — Z79899 Other long term (current) drug therapy: Secondary | ICD-10-CM

## 2024-01-06 NOTE — Progress Notes (Signed)
 Patient presented to office for labs. Patient labs were drawn in patient's right AC. Patient tolerated labs well. No complaints noted. Patient left alert and ambulatory.

## 2024-01-06 NOTE — Progress Notes (Signed)
 Presented to office for EKG. Patient tolerated procedure well. Patient left alert and ambulatory.

## 2024-01-07 ENCOUNTER — Encounter (HOSPITAL_COMMUNITY): Payer: Self-pay

## 2024-01-07 ENCOUNTER — Ambulatory Visit (INDEPENDENT_AMBULATORY_CARE_PROVIDER_SITE_OTHER)

## 2024-01-07 VITALS — BP 123/70 | HR 80 | Resp 16 | Ht 64.0 in | Wt 118.0 lb

## 2024-01-07 DIAGNOSIS — F1994 Other psychoactive substance use, unspecified with psychoactive substance-induced mood disorder: Secondary | ICD-10-CM

## 2024-01-07 NOTE — Progress Notes (Cosign Needed)
 Patient presented to office for injection of Abilify  Maintena 300 mg. Patient received injection in right deltoid. Patient tolerated injection well. Patient denies SI/HI/AVH. Patient denies any concerns. Patient will return in 28 days. Patient left alert and ambulatory.

## 2024-01-13 LAB — LIPID PANEL

## 2024-01-13 LAB — HEMOGLOBIN A1C
Est. average glucose Bld gHb Est-mCnc: 117 mg/dL
Hgb A1c MFr Bld: 5.7 % — ABNORMAL HIGH (ref 4.8–5.6)

## 2024-02-09 ENCOUNTER — Ambulatory Visit (INDEPENDENT_AMBULATORY_CARE_PROVIDER_SITE_OTHER)

## 2024-02-09 ENCOUNTER — Encounter (HOSPITAL_COMMUNITY): Payer: Self-pay

## 2024-02-09 VITALS — BP 120/75 | HR 76 | Wt 119.2 lb

## 2024-02-09 DIAGNOSIS — F200 Paranoid schizophrenia: Secondary | ICD-10-CM

## 2024-02-09 DIAGNOSIS — F411 Generalized anxiety disorder: Secondary | ICD-10-CM

## 2024-02-09 NOTE — Progress Notes (Signed)
 Patient presented to office for injection of Abilify  Maintena 300 mg. Patient received injection in LEFT deltoid. Patient tolerated injection well. Patient denies SI/HI/AVH. Patient denies any concerns. Patient will return in 28 days. Patient left alert and ambulatory.

## 2024-03-08 ENCOUNTER — Ambulatory Visit (INDEPENDENT_AMBULATORY_CARE_PROVIDER_SITE_OTHER)

## 2024-03-08 ENCOUNTER — Encounter (HOSPITAL_COMMUNITY): Payer: Self-pay

## 2024-03-08 ENCOUNTER — Encounter (HOSPITAL_COMMUNITY): Admitting: Student in an Organized Health Care Education/Training Program

## 2024-03-08 VITALS — BP 124/80 | HR 75 | Ht 64.0 in | Wt 123.4 lb

## 2024-03-08 DIAGNOSIS — F1994 Other psychoactive substance use, unspecified with psychoactive substance-induced mood disorder: Secondary | ICD-10-CM

## 2024-03-08 NOTE — Progress Notes (Cosign Needed)
 Patient presented to office for injection of Abilify  Maintena 300 mg. Patient received injection in right deltoid. Patient tolerated injection well. Patient denies SI/HI/AVH. Patient denies any concerns. Patient will return in 28 days. Patient left alert and ambulatory.

## 2024-04-05 ENCOUNTER — Ambulatory Visit (HOSPITAL_COMMUNITY)

## 2024-04-05 ENCOUNTER — Encounter (HOSPITAL_COMMUNITY): Admitting: Student in an Organized Health Care Education/Training Program

## 2024-11-14 ENCOUNTER — Encounter
